# Patient Record
Sex: Female | Born: 1940 | Race: White | Hispanic: No | Marital: Married | Smoking: Never smoker
Health system: Southern US, Community
[De-identification: ages and names within clinical notes are randomized; demographics above are authoritative.]

## PROBLEM LIST (undated history)

## (undated) DIAGNOSIS — I10 Essential (primary) hypertension: Secondary | ICD-10-CM

## (undated) DIAGNOSIS — F419 Anxiety disorder, unspecified: Secondary | ICD-10-CM

## (undated) DIAGNOSIS — F32A Depression, unspecified: Secondary | ICD-10-CM

## (undated) DIAGNOSIS — G4730 Sleep apnea, unspecified: Secondary | ICD-10-CM

## (undated) DIAGNOSIS — T7840XA Allergy, unspecified, initial encounter: Secondary | ICD-10-CM

## (undated) DIAGNOSIS — E079 Disorder of thyroid, unspecified: Secondary | ICD-10-CM

## (undated) DIAGNOSIS — F329 Major depressive disorder, single episode, unspecified: Secondary | ICD-10-CM

## (undated) HISTORY — PX: WRIST FRACTURE SURGERY: SHX121

## (undated) HISTORY — DX: Essential (primary) hypertension: I10

## (undated) HISTORY — DX: Sleep apnea, unspecified: G47.30

## (undated) HISTORY — DX: Major depressive disorder, single episode, unspecified: F32.9

## (undated) HISTORY — PX: FRACTURE SURGERY: SHX138

## (undated) HISTORY — DX: Disorder of thyroid, unspecified: E07.9

## (undated) HISTORY — PX: EYE SURGERY: SHX253

## (undated) HISTORY — DX: Allergy, unspecified, initial encounter: T78.40XA

## (undated) HISTORY — PX: BREAST BIOPSY: SHX20

## (undated) HISTORY — PX: URETHRA SURGERY: SHX824

## (undated) HISTORY — DX: Anxiety disorder, unspecified: F41.9

## (undated) HISTORY — DX: Depression, unspecified: F32.A

---

## 1966-01-21 HISTORY — PX: BREAST SURGERY: SHX581

## 1974-01-21 HISTORY — PX: ABDOMINAL HYSTERECTOMY: SHX81

## 1991-01-22 HISTORY — PX: NECK SURGERY: SHX720

## 2005-01-02 ENCOUNTER — Ambulatory Visit: Payer: Self-pay | Admitting: Family Medicine

## 2005-03-22 ENCOUNTER — Ambulatory Visit: Payer: Self-pay | Admitting: Family Medicine

## 2006-05-22 ENCOUNTER — Ambulatory Visit: Payer: Self-pay | Admitting: Family Medicine

## 2007-11-12 ENCOUNTER — Ambulatory Visit: Payer: Self-pay | Admitting: Family Medicine

## 2008-01-22 DIAGNOSIS — M25561 Pain in right knee: Secondary | ICD-10-CM | POA: Insufficient documentation

## 2008-08-03 ENCOUNTER — Emergency Department: Payer: Self-pay | Admitting: Emergency Medicine

## 2008-08-04 ENCOUNTER — Ambulatory Visit: Payer: Self-pay | Admitting: Unknown Physician Specialty

## 2008-08-08 ENCOUNTER — Emergency Department: Payer: Self-pay | Admitting: Internal Medicine

## 2008-09-02 ENCOUNTER — Encounter: Payer: Self-pay | Admitting: Unknown Physician Specialty

## 2008-09-21 ENCOUNTER — Encounter: Payer: Self-pay | Admitting: Unknown Physician Specialty

## 2008-10-21 ENCOUNTER — Encounter: Payer: Self-pay | Admitting: Unknown Physician Specialty

## 2008-11-08 ENCOUNTER — Ambulatory Visit: Payer: Self-pay | Admitting: Internal Medicine

## 2008-11-21 ENCOUNTER — Encounter: Payer: Self-pay | Admitting: Unknown Physician Specialty

## 2009-03-21 ENCOUNTER — Ambulatory Visit: Payer: Self-pay | Admitting: Internal Medicine

## 2009-04-21 ENCOUNTER — Ambulatory Visit: Payer: Self-pay | Admitting: Internal Medicine

## 2009-06-29 ENCOUNTER — Ambulatory Visit: Payer: Self-pay | Admitting: Internal Medicine

## 2009-07-28 LAB — HM DEXA SCAN

## 2010-02-06 ENCOUNTER — Ambulatory Visit: Payer: Self-pay | Admitting: Otolaryngology

## 2010-06-19 ENCOUNTER — Ambulatory Visit: Payer: Self-pay | Admitting: Internal Medicine

## 2010-10-29 ENCOUNTER — Other Ambulatory Visit: Payer: Self-pay | Admitting: Internal Medicine

## 2010-11-02 ENCOUNTER — Encounter: Payer: Self-pay | Admitting: Internal Medicine

## 2010-11-07 ENCOUNTER — Other Ambulatory Visit: Payer: Self-pay | Admitting: Internal Medicine

## 2010-12-07 ENCOUNTER — Other Ambulatory Visit (INDEPENDENT_AMBULATORY_CARE_PROVIDER_SITE_OTHER): Payer: Medicare Other | Admitting: *Deleted

## 2010-12-07 DIAGNOSIS — Z13220 Encounter for screening for lipoid disorders: Secondary | ICD-10-CM

## 2010-12-07 DIAGNOSIS — Z0000 Encounter for general adult medical examination without abnormal findings: Secondary | ICD-10-CM

## 2010-12-07 DIAGNOSIS — E785 Hyperlipidemia, unspecified: Secondary | ICD-10-CM

## 2010-12-07 LAB — COMPREHENSIVE METABOLIC PANEL
AST: 24 U/L (ref 0–37)
BUN: 21 mg/dL (ref 6–23)
CO2: 28 mEq/L (ref 19–32)
Calcium: 9.1 mg/dL (ref 8.4–10.5)
Chloride: 107 mEq/L (ref 96–112)
Creatinine, Ser: 0.8 mg/dL (ref 0.4–1.2)
GFR: 75.32 mL/min (ref >60.00)
Total Bilirubin: 1.4 mg/dL — ABNORMAL HIGH (ref 0.3–1.2)

## 2010-12-07 LAB — CBC WITH DIFFERENTIAL/PLATELET
Eosinophils Relative: 3.2 % (ref 0.0–5.0)
Lymphocytes Relative: 26.6 % (ref 12.0–46.0)
MCV: 91.3 fl (ref 78.0–100.0)
Monocytes Absolute: 0.7 10*3/uL (ref 0.1–1.0)
Neutrophils Relative %: 60.3 % (ref 43.0–77.0)
Platelets: 133 10*3/uL — ABNORMAL LOW (ref 150.0–400.0)
RBC: 4.62 Mil/uL (ref 3.87–5.11)
WBC: 7.4 10*3/uL (ref 4.5–10.5)

## 2010-12-07 LAB — LIPID PANEL
Cholesterol: 121 mg/dL (ref 0–200)
HDL: 48.4 mg/dL (ref >39.00)
LDL Cholesterol: 59 mg/dL (ref 0–99)
Triglycerides: 66 mg/dL (ref 0.0–149.0)

## 2010-12-12 ENCOUNTER — Ambulatory Visit: Payer: Self-pay | Admitting: Internal Medicine

## 2010-12-14 ENCOUNTER — Encounter: Payer: Self-pay | Admitting: *Deleted

## 2010-12-14 ENCOUNTER — Encounter: Payer: Self-pay | Admitting: Internal Medicine

## 2010-12-18 ENCOUNTER — Ambulatory Visit (INDEPENDENT_AMBULATORY_CARE_PROVIDER_SITE_OTHER): Payer: Medicare Other | Admitting: Internal Medicine

## 2010-12-18 ENCOUNTER — Encounter: Payer: Self-pay | Admitting: Internal Medicine

## 2010-12-18 VITALS — BP 148/80 | HR 63 | Temp 98.5°F | Wt 229.0 lb

## 2010-12-18 DIAGNOSIS — E785 Hyperlipidemia, unspecified: Secondary | ICD-10-CM

## 2010-12-18 DIAGNOSIS — I10 Essential (primary) hypertension: Secondary | ICD-10-CM

## 2010-12-18 DIAGNOSIS — J329 Chronic sinusitis, unspecified: Secondary | ICD-10-CM

## 2010-12-18 DIAGNOSIS — Z0000 Encounter for general adult medical examination without abnormal findings: Secondary | ICD-10-CM

## 2010-12-18 MED ORDER — AMOXICILLIN-POT CLAVULANATE 875-125 MG PO TABS: 1.0000 | ORAL_TABLET | Freq: Two times a day (BID) | ORAL | Status: AC

## 2010-12-18 NOTE — Patient Instructions (Signed)
Labs before next visit

## 2010-12-18 NOTE — Progress Notes (Signed)
Subjective:    Patient ID: Courtney Stone, female    DOB: [birth year], 70 y.o.   MRN: [id number]  HPI 70YO female with HTN presents for follow up.  Reports she has been well. Reports full compliance with BP meds.  Notes recent episode of sinusitis, with sinus pressure, purulent nasal drainage and cough. Denies fever or chills.  Was seen by ENT and treated with clarithromycin with no improvement. Symptoms have now persisted x 1 month.  We discussed findings on recent labs including slightly low plt at 133 and slightly elevated bilirubin as below.  Outpatient Encounter Prescriptions as of 12/18/2010  Medication Sig Dispense Refill  . B Complex-C-Folic Acid (MULTIVITAMIN, STRESS FORMULA) tablet Take 1 tablet by mouth daily.        . Calcium Carbonate-Vitamin D (CALCIUM 600+D) 600-400 MG-UNIT per tablet Take 1 tablet by mouth 2 (two) times daily.        . fluocinonide cream (LIDEX) 0.05 % Apply 1 application topically 2 (two) times daily as needed.        Marland Kitchen FLUoxetine (PROZAC) 20 MG capsule take 1 capsule by mouth once daily  600 capsule  0  . NASONEX 50 MCG/ACT nasal spray instill 2 sprays into each nostril once daily  17 g  3  . triamterene-hydrochlorothiazide (MAXZIDE-25) 37.5-25 MG per tablet take 1 tablet by mouth once daily  30 tablet  3  . vitamin C (ASCORBIC ACID) 500 MG tablet Take 500 mg by mouth daily.          Review of Systems  Constitutional: Negative for fever, chills, appetite change, fatigue and unexpected weight change.  HENT: Positive for congestion, rhinorrhea, postnasal drip and sinus pressure. Negative for ear pain, sore throat, trouble swallowing, neck pain and voice change.   Eyes: Negative for visual disturbance.  Respiratory: Positive for cough. Negative for shortness of breath and wheezing.   Cardiovascular: Negative for chest pain, palpitations and leg swelling.  Gastrointestinal: Negative for nausea, abdominal pain, diarrhea, constipation and abdominal distention.    Genitourinary: Negative for dysuria and flank pain.  Musculoskeletal: Negative for myalgias, arthralgias and gait problem.  Skin: Negative for color change and rash.  Neurological: Positive for headaches. Negative for dizziness.  Hematological: Negative for adenopathy. Does not bruise/bleed easily.  Psychiatric/Behavioral: Negative for sleep disturbance and dysphoric mood. The patient is not nervous/anxious.    BP 148/80  Pulse 63  Temp(Src) 98.5 F (36.9 C) (Oral)  Wt 229 lb (103.874 kg)  SpO2 97%     Objective:   Physical Exam  Constitutional: She is oriented to person, place, and time. She appears well-developed and well-nourished. No distress.  HENT:  Head: Normocephalic and atraumatic.  Right Ear: External ear normal. A middle ear effusion is present.  Left Ear: External ear normal. A middle ear effusion is present.  Nose: Mucosal edema present.  Mouth/Throat: Oropharynx is clear and moist. No oropharyngeal exudate.  Eyes: Conjunctivae are normal. Pupils are equal, round, and reactive to light. Right eye exhibits no discharge. Left eye exhibits no discharge. No scleral icterus.  Neck: Normal range of motion. Neck supple. No tracheal deviation present. No thyromegaly present.  Cardiovascular: Normal rate, regular rhythm, normal heart sounds and intact distal pulses.  Exam reveals no gallop and no friction rub.   No murmur heard. Pulmonary/Chest: Effort normal and breath sounds normal. No respiratory distress. She has no wheezes. She has no rales. She exhibits no tenderness.  Musculoskeletal: Normal range of motion. She exhibits no edema and  no tenderness.  Lymphadenopathy:    She has no cervical adenopathy.  Neurological: She is alert and oriented to person, place, and time. No cranial nerve deficit. She exhibits normal muscle tone. Coordination normal.  Skin: Skin is warm and dry. No rash noted. She is not diaphoretic. No erythema. No pallor.  Psychiatric: She has a normal  mood and affect. Her behavior is normal. Judgment and thought content normal.          Assessment & Plan:  1. Sinusitis - Recurrent. No improvement with clarithromycin. Will try course of Augmentin. She will continue nasonex. If persistent, she will followup with ENT.  2. Hypertension - BP slightly elevated today, but generally well controlled on Triamterene-HCTZ.  Renal function was normal 11/2010.  Follow up in 6 months.  3. Thrombocytopenia - Note plt 133 on last check. No bleeding or bruising.  Pt prefers to wait until labs 05/2011 to recheck. Will call sooner if any easy bleeding or bruising.  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 Will repeat with labs in 6 months.  5. Health maintenance - Flu shot today. Pt prefers mammogram every other year, so next in 05/2011.

## 2010-12-27 ENCOUNTER — Encounter: Payer: Self-pay | Admitting: Internal Medicine

## 2011-04-22 ENCOUNTER — Ambulatory Visit (INDEPENDENT_AMBULATORY_CARE_PROVIDER_SITE_OTHER): Payer: Medicare Other | Admitting: Internal Medicine

## 2011-04-22 ENCOUNTER — Encounter: Payer: Self-pay | Admitting: Internal Medicine

## 2011-04-22 VITALS — BP 128/70 | HR 75 | Temp 98.3°F | Ht 67.0 in | Wt 235.0 lb

## 2011-04-22 DIAGNOSIS — J0110 Acute frontal sinusitis, unspecified: Secondary | ICD-10-CM | POA: Insufficient documentation

## 2011-04-22 MED ORDER — HYDROCOD POLST-CHLORPHEN POLST 10-8 MG/5ML PO LQCR: 5.0000 mL | Freq: Every evening | ORAL | Status: DC | PRN

## 2011-04-22 MED ORDER — AMOXICILLIN-POT CLAVULANATE 875-125 MG PO TABS: 1.0000 | ORAL_TABLET | Freq: Two times a day (BID) | ORAL | Status: AC

## 2011-04-22 NOTE — Assessment & Plan Note (Signed)
Symptoms and exam c/w frontal sinusitis. Will treat with augmentin x 10 days. Will use Tussionex for cough. Pt will call if symptoms not improving over next 48hr.

## 2011-04-22 NOTE — Progress Notes (Signed)
Subjective:    Patient ID: Courtney Stone, female    DOB: [birth year], 71 y.o.   MRN: [id number]  HPI 70YO female presents for acute visit c/o 4 week h/o nasal congestion, non-productive cough.  Symptoms were initially associated with subjective fever, chills, malaise, which have now resolved.  Pt now having frontal headache pain, bilateral ear pain.  Cough persistent. No dyspnea, chest pain.  Taking Tussionex for cough with some improvement.  Outpatient Encounter Prescriptions as of 04/22/2011  Medication Sig Dispense Refill  . B Complex-C-Folic Acid (MULTIVITAMIN, STRESS FORMULA) tablet Take 1 tablet by mouth daily.        . Calcium Carbonate-Vitamin D (CALCIUM 600+D) 600-400 MG-UNIT per tablet Take 1 tablet by mouth 2 (two) times daily.        Marland Kitchen FLUoxetine (PROZAC) 20 MG capsule take 1 capsule by mouth once daily  600 capsule  0  . NASONEX 50 MCG/ACT nasal spray instill 2 sprays into each nostril once daily  17 g  3  . triamterene-hydrochlorothiazide (MAXZIDE-25) 37.5-25 MG per tablet take 1 tablet by mouth once daily  30 tablet  3  . vitamin C (ASCORBIC ACID) 500 MG tablet Take 500 mg by mouth daily.        Marland Kitchen DISCONTD: fluocinonide cream (LIDEX) 0.05 % Apply 1 application topically 2 (two) times daily as needed.        Marland Kitchen amoxicillin-clavulanate (AUGMENTIN) 875-125 MG per tablet Take 1 tablet by mouth 2 (two) times daily.  20 tablet  0  . chlorpheniramine-HYDROcodone (TUSSIONEX PENNKINETIC ER) 10-8 MG/5ML LQCR Take 5 mLs by mouth at bedtime as needed.  140 mL  0    Review of Systems  Constitutional: Positive for fever, chills and fatigue. Negative for unexpected weight change.  HENT: Positive for ear pain, congestion and sinus pressure. Negative for hearing loss, nosebleeds, sore throat, facial swelling, rhinorrhea, sneezing, mouth sores, trouble swallowing, neck pain, neck stiffness, voice change, postnasal drip, tinnitus and ear discharge.   Eyes: Negative for pain, discharge, redness and  visual disturbance.  Respiratory: Positive for cough. Negative for chest tightness, shortness of breath, wheezing and stridor.   Cardiovascular: Negative for chest pain, palpitations and leg swelling.  Musculoskeletal: Negative for myalgias and arthralgias.  Skin: Negative for color change and rash.  Neurological: Negative for dizziness, weakness, light-headedness and headaches.  Hematological: Negative for adenopathy.   BP 128/70  Pulse 75  Temp(Src) 98.3 F (36.8 C) (Oral)  Ht 5\' 7"  (1.702 m)  Wt 235 lb (106.595 kg)  BMI 36.81 kg/m2  SpO2 97%     Objective:   Physical Exam  Constitutional: She is oriented to person, place, and time. She appears well-developed and well-nourished. No distress.  HENT:  Head: Normocephalic and atraumatic.  Right Ear: External ear normal. Tympanic membrane is bulging. Tympanic membrane is not erythematous. A middle ear effusion is present.  Left Ear: External ear normal. Tympanic membrane is bulging. Tympanic membrane is not erythematous. A middle ear effusion is present.  Nose: Mucosal edema present. Right sinus exhibits frontal sinus tenderness.  Mouth/Throat: Oropharynx is clear and moist. No oropharyngeal exudate.  Eyes: Conjunctivae are normal. Pupils are equal, round, and reactive to light. Right eye exhibits no discharge. Left eye exhibits no discharge. No scleral icterus.  Neck: Normal range of motion. Neck supple. No tracheal deviation present. No thyromegaly present.  Cardiovascular: Normal rate, regular rhythm, normal heart sounds and intact distal pulses.  Exam reveals no gallop and no friction rub.  No murmur heard. Pulmonary/Chest: Effort normal. No accessory muscle usage. Not tachypneic. No respiratory distress. She has no decreased breath sounds. She has no wheezes. She has rhonchi in the left lower field. She has no rales. She exhibits no tenderness.  Musculoskeletal: Normal range of motion. She exhibits no edema and no tenderness.    Lymphadenopathy:    She has no cervical adenopathy.  Neurological: She is alert and oriented to person, place, and time. No cranial nerve deficit. She exhibits normal muscle tone. Coordination normal.  Skin: Skin is warm and dry. No rash noted. She is not diaphoretic. No erythema. No pallor.  Psychiatric: She has a normal mood and affect. Her behavior is normal. Judgment and thought content normal.          Assessment & Plan:

## 2011-06-18 ENCOUNTER — Ambulatory Visit: Payer: Medicare Other | Admitting: Internal Medicine

## 2011-06-21 ENCOUNTER — Telehealth: Payer: Self-pay | Admitting: Internal Medicine

## 2011-06-21 ENCOUNTER — Ambulatory Visit (INDEPENDENT_AMBULATORY_CARE_PROVIDER_SITE_OTHER): Payer: Medicare Other | Admitting: Internal Medicine

## 2011-06-21 ENCOUNTER — Encounter: Payer: Self-pay | Admitting: Internal Medicine

## 2011-06-21 VITALS — BP 150/88 | HR 63 | Temp 98.1°F | Resp 16 | Ht 66.5 in | Wt 228.0 lb

## 2011-06-21 DIAGNOSIS — E669 Obesity, unspecified: Secondary | ICD-10-CM

## 2011-06-21 DIAGNOSIS — I10 Essential (primary) hypertension: Secondary | ICD-10-CM

## 2011-06-21 DIAGNOSIS — E039 Hypothyroidism, unspecified: Secondary | ICD-10-CM

## 2011-06-21 DIAGNOSIS — E119 Type 2 diabetes mellitus without complications: Secondary | ICD-10-CM | POA: Insufficient documentation

## 2011-06-21 DIAGNOSIS — Z1239 Encounter for other screening for malignant neoplasm of breast: Secondary | ICD-10-CM

## 2011-06-21 DIAGNOSIS — Z0000 Encounter for general adult medical examination without abnormal findings: Secondary | ICD-10-CM

## 2011-06-21 DIAGNOSIS — D649 Anemia, unspecified: Secondary | ICD-10-CM

## 2011-06-21 LAB — CBC WITH DIFFERENTIAL/PLATELET
Basophils Relative: 0.5 % (ref 0.0–3.0)
Eosinophils Relative: 1.5 % (ref 0.0–5.0)
Lymphocytes Relative: 31.3 % (ref 12.0–46.0)
MCV: 90.5 fl (ref 78.0–100.0)
Monocytes Relative: 10 % (ref 3.0–12.0)
Neutrophils Relative %: 56.7 % (ref 43.0–77.0)
Platelets: 131 10*3/uL — ABNORMAL LOW (ref 150.0–400.0)
RBC: 4.73 Mil/uL (ref 3.87–5.11)
WBC: 5.9 10*3/uL (ref 4.5–10.5)

## 2011-06-21 LAB — COMPREHENSIVE METABOLIC PANEL
ALT: 21 U/L (ref 0–35)
BUN: 14 mg/dL (ref 6–23)
CO2: 27 mEq/L (ref 19–32)
Creatinine, Ser: 0.8 mg/dL (ref 0.4–1.2)
GFR: 76.31 mL/min (ref >60.00)
Total Bilirubin: 1.3 mg/dL — ABNORMAL HIGH (ref 0.3–1.2)

## 2011-06-21 LAB — LIPID PANEL
HDL: 42.4 mg/dL (ref >39.00)
LDL Cholesterol: 48 mg/dL (ref 0–99)
Total CHOL/HDL Ratio: 3
Triglycerides: 87 mg/dL (ref 0.0–149.0)

## 2011-06-21 LAB — HEMOGLOBIN A1C: Hgb A1c MFr Bld: 6.8 % — ABNORMAL HIGH (ref 4.6–6.5)

## 2011-06-21 LAB — TSH: TSH: 0.07 u[IU]/mL — ABNORMAL LOW (ref 0.35–5.50)

## 2011-06-21 MED ORDER — TRIAMTERENE-HCTZ 37.5-25 MG PO TABS: 1.0000 | ORAL_TABLET | Freq: Every day | ORAL | Status: DC

## 2011-06-21 NOTE — Progress Notes (Signed)
Subjective:    Patient ID: Courtney Stone, female    DOB: 1940-12-22, 71 y.o.   MRN: [id number]  HPI The patient is here for annual Medicare wellness examination and management of other chronic and acute problems.   The risk factors are reflected in the social history.  The roster of all physicians providing medical care to patient - is listed in the Snapshot section of the chart.  Activities of daily living:  The patient is 100% independent in all ADLs: dressing, toileting, feeding as well as independent mobility  Home safety : The patient has smoke detectors in the home. They wear seatbelts.  There are no firearms at home. There is no violence in the home.   There is no risks for hepatitis, STDs or HIV. There is no history of blood transfusion. They have no travel history to infectious disease endemic areas of the world.  The patient has seen their dentist in the last six month [facility]). They have seen their eye doctor in the last year (Dr. Dorcas Mcmurray). Hearing testing in distant past, occasional trouble hearing whispered voice, followed by ENT.   They do not  have excessive sun exposure. Discussed the need for sun protection: hats, long sleeves and use of sunscreen if there is significant sun exposure.   Diet: the importance of a healthy diet is discussed. They do have a healthy diet.  The benefits of regular aerobic exercise were discussed. No regular exercise. Walking occasionally.  Depression screen: there are no signs or vegative symptoms of depression- irritability, change in appetite, anhedonia, sadness/tearfullness.  Cognitive assessment: the patient manages all their financial and personal affairs and is actively engaged. They could relate day,date,year and events; performed clock-face test normally. Volunteers at school, teaching math.  The following portions of the patient's history were reviewed and updated as appropriate: allergies, current medications, past family  history, past medical history,  past surgical history, past social history  and problem list.  Visual acuity was not assessed per patient preference since she has regular follow up with her ophthalmologist. Hearing and body mass index were assessed and reviewed.   During the course of the visit the patient was educated and counseled about appropriate screening and preventive services including : fall prevention , diabetes screening, nutrition counseling, colorectal cancer screening, and recommended immunizations.    Patient has a history of hypertension and diet-controlled prediabetes. She reports that over the last month or so her sugars have been more elevated, typically near 120. She reports increased stress at work, he notes some dietary indiscretion. She is not currently taking medication for diabetes. In regards to her blood pressure, she reports full compliance with her medication. She denies any chest pain, palpitations, headache.  Outpatient Encounter Prescriptions as of 06/21/2011  Medication Sig Dispense Refill  . B Complex-C-Folic Acid (MULTIVITAMIN, STRESS FORMULA) tablet Take 1 tablet by mouth daily.        . Calcium Carbonate-Vitamin D (CALCIUM 600+D) 600-400 MG-UNIT per tablet Take 1 tablet by mouth 2 (two) times daily.        Marland Kitchen triamterene-hydrochlorothiazide (MAXZIDE-25) 37.5-25 MG per tablet Take 1 each (1 tablet total) by mouth daily.  90 tablet  3  . vitamin C (ASCORBIC ACID) 500 MG tablet Take 500 mg by mouth daily.          Review of Systems  Constitutional: Negative for fever, chills, appetite change, fatigue and unexpected weight change.  HENT: Negative for ear pain, congestion, sore throat, trouble swallowing, neck  pain, voice change and sinus pressure.   Eyes: Negative for visual disturbance.  Respiratory: Negative for cough, shortness of breath, wheezing and stridor.   Cardiovascular: Negative for chest pain, palpitations and leg swelling.  Gastrointestinal: Negative  for nausea, vomiting, abdominal pain, diarrhea, constipation, blood in stool, abdominal distention and anal bleeding.  Genitourinary: Negative for dysuria and flank pain.  Musculoskeletal: Negative for myalgias, arthralgias and gait problem.  Skin: Negative for color change and rash.  Neurological: Negative for dizziness and headaches.  Hematological: Negative for adenopathy. Does not bruise/bleed easily.  Psychiatric/Behavioral: Negative for suicidal ideas, sleep disturbance and dysphoric mood. The patient is not nervous/anxious.    BP 150/88  Pulse 63  Temp(Src) 98.1 F (36.7 C) (Oral)  Resp 16  Ht 5' 6.5" (1.689 m)  Wt 228 lb (103.42 kg)  BMI 36.25 kg/m2  SpO2 94%     Objective:   Physical Exam  Constitutional: She is oriented to person, place, and time. She appears well-developed and well-nourished. No distress.  HENT:  Head: Normocephalic and atraumatic.  Right Ear: External ear normal.  Left Ear: External ear normal.  Nose: Nose normal.  Mouth/Throat: Oropharynx is clear and moist. No oropharyngeal exudate.  Eyes: Conjunctivae are normal. Pupils are equal, round, and reactive to light. Right eye exhibits no discharge. Left eye exhibits no discharge. No scleral icterus.  Neck: Normal range of motion. Neck supple. No tracheal deviation present. No thyromegaly present.  Cardiovascular: Normal rate, regular rhythm, normal heart sounds and intact distal pulses.  Exam reveals no gallop and no friction rub.   No murmur heard. Pulmonary/Chest: Effort normal and breath sounds normal. No respiratory distress. She has no wheezes. She has no rales. She exhibits no tenderness. Right breast exhibits no inverted nipple, no mass, no nipple discharge, no skin change and no tenderness. Left breast exhibits no inverted nipple, no mass, no nipple discharge, no skin change and no tenderness. Breasts are symmetrical.  Abdominal: Soft. Bowel sounds are normal. She exhibits no distension and no mass.  There is no tenderness. There is no rebound and no guarding.  Musculoskeletal: Normal range of motion. She exhibits no edema and no tenderness.  Lymphadenopathy:    She has no cervical adenopathy.  Neurological: She is alert and oriented to person, place, and time. No cranial nerve deficit. She exhibits normal muscle tone. Coordination normal.  Skin: Skin is warm and dry. No rash noted. She is not diaphoretic. No erythema. No pallor.  Psychiatric: She has a normal mood and affect. Her behavior is normal. Judgment and thought content normal.          Assessment & Plan:

## 2011-06-21 NOTE — Assessment & Plan Note (Signed)
Blood pressure slightly elevated today. However, patient reports better controlled at home. We'll continue triamterene hydrochlorothiazide. We'll check renal function with labs today. Follow up 6 months.

## 2011-06-21 NOTE — Assessment & Plan Note (Signed)
General medical exam including breast exam is normal today. Patient is up-to-date on health maintenance. Appropriate screening performed and referrals placed. Patient will followup in 6 months.

## 2011-06-21 NOTE — Assessment & Plan Note (Signed)
BMI 36. Encouraged better compliance with healthy diet and increased physical activity. Recommended walking at least 30 minutes most days of the week.

## 2011-06-21 NOTE — Telephone Encounter (Signed)
Last mammogram reviewed from June 9 , 2011. Pt will need to have follow up mammogram. Order is placed

## 2011-06-21 NOTE — Assessment & Plan Note (Signed)
Historically, blood sugars have been controlled with diet, however patient reports recent increase in blood sugars. Will check hemoglobin A1c with labs today.

## 2011-06-27 ENCOUNTER — Telehealth: Payer: Self-pay | Admitting: *Deleted

## 2011-06-27 NOTE — Telephone Encounter (Signed)
Patient was advised of her lab results.  She stated that she would rather see Dr. Dan Humphreys again first before starting any medication.  Follow up appt scheduled for 07/29/2011.

## 2011-07-29 ENCOUNTER — Telehealth: Payer: Self-pay | Admitting: Internal Medicine

## 2011-07-29 ENCOUNTER — Encounter: Payer: Self-pay | Admitting: Internal Medicine

## 2011-07-29 ENCOUNTER — Ambulatory Visit (INDEPENDENT_AMBULATORY_CARE_PROVIDER_SITE_OTHER): Payer: Medicare Other | Admitting: Internal Medicine

## 2011-07-29 VITALS — BP 120/80 | HR 61 | Temp 98.6°F | Ht 66.5 in | Wt 227.0 lb

## 2011-07-29 DIAGNOSIS — E0590 Thyrotoxicosis, unspecified without thyrotoxic crisis or storm: Secondary | ICD-10-CM | POA: Insufficient documentation

## 2011-07-29 DIAGNOSIS — E039 Hypothyroidism, unspecified: Secondary | ICD-10-CM

## 2011-07-29 DIAGNOSIS — E119 Type 2 diabetes mellitus without complications: Secondary | ICD-10-CM

## 2011-07-29 DIAGNOSIS — E1165 Type 2 diabetes mellitus with hyperglycemia: Secondary | ICD-10-CM | POA: Insufficient documentation

## 2011-07-29 MED ORDER — GLUCOSE BLOOD VI STRP: ORAL_STRIP | Status: AC

## 2011-07-29 NOTE — Progress Notes (Signed)
Subjective:    Patient ID: Courtney Stone, female    DOB: 03/26/1940, 71 y.o.   MRN: [id number]  HPI 71 year old female with history of diabetes presents for followup. Historically, her diabetes has been diet controlled. At her last visit, her A1c was noted to have increased to 6.8%. Recommended that she start on metformin. However, she would prefer to control blood sugars with diet. She reports significant improvement in dietary intake and physical activity. She brings record of her fasting blood sugars which are typically between 100-130. She denies any sugars greater than 200. She otherwise reports that she has been feeling well.  Outpatient Encounter Prescriptions as of 07/29/2011  Medication Sig Dispense Refill  . Calcium Carbonate-Vitamin D (CALCIUM 600+D) 600-400 MG-UNIT per tablet Take 1 tablet by mouth 2 (two) times daily.        . Multiple Vitamins-Minerals (CENTRUM SILVER PO) Take 1 tablet by mouth daily.      Marland Kitchen triamterene-hydrochlorothiazide (MAXZIDE-25) 37.5-25 MG per tablet Take 1 each (1 tablet total) by mouth daily.  90 tablet  3  . vitamin C (ASCORBIC ACID) 500 MG tablet Take 500 mg by mouth daily.        Marland Kitchen glucose blood test strip Use as instructed  100 each  12  . DISCONTD: B Complex-C-Folic Acid (MULTIVITAMIN, STRESS FORMULA) tablet Take 1 tablet by mouth daily.          Review of Systems  Constitutional: Negative for fever, chills, appetite change, fatigue and unexpected weight change.  HENT: Negative for ear pain, congestion, sore throat, trouble swallowing, neck pain, voice change and sinus pressure.   Eyes: Negative for visual disturbance.  Respiratory: Negative for cough, shortness of breath, wheezing and stridor.   Cardiovascular: Negative for chest pain, palpitations and leg swelling.  Gastrointestinal: Negative for nausea, vomiting, abdominal pain, diarrhea, constipation, blood in stool, abdominal distention and anal bleeding.  Genitourinary: Negative for dysuria and  flank pain.  Musculoskeletal: Negative for myalgias, arthralgias and gait problem.  Skin: Negative for color change and rash.  Neurological: Negative for dizziness and headaches.  Hematological: Negative for adenopathy. Does not bruise/bleed easily.  Psychiatric/Behavioral: Negative for suicidal ideas, disturbed wake/sleep cycle and dysphoric mood. The patient is not nervous/anxious.    BP 120/80  Pulse 61  Temp 98.6 F (37 C) (Oral)  Ht 5' 6.5" (1.689 m)  Wt 227 lb (102.967 kg)  BMI 36.09 kg/m2  SpO2 97%     Objective:   Physical Exam  Constitutional: She is oriented to person, place, and time. She appears well-developed and well-nourished. No distress.  HENT:  Head: Normocephalic and atraumatic.  Right Ear: External ear normal.  Left Ear: External ear normal.  Nose: Nose normal.  Mouth/Throat: Oropharynx is clear and moist. No oropharyngeal exudate.  Eyes: Conjunctivae are normal. Pupils are equal, round, and reactive to light. Right eye exhibits no discharge. Left eye exhibits no discharge. No scleral icterus.  Neck: Normal range of motion. Neck supple. No tracheal deviation present. No thyromegaly present.  Cardiovascular: Normal rate, regular rhythm, normal heart sounds and intact distal pulses.  Exam reveals no gallop and no friction rub.   No murmur heard. Pulmonary/Chest: Effort normal and breath sounds normal. No respiratory distress. She has no wheezes. She has no rales. She exhibits no tenderness.  Musculoskeletal: Normal range of motion. She exhibits no edema and no tenderness.  Lymphadenopathy:    She has no cervical adenopathy.  Neurological: She is alert and oriented to person, place, and  time. No cranial nerve deficit. She exhibits normal muscle tone. Coordination normal.  Skin: Skin is warm and dry. No rash noted. She is not diaphoretic. No erythema. No pallor.  Psychiatric: She has a normal mood and affect. Her behavior is normal. Judgment and thought content  normal.          Assessment & Plan:

## 2011-07-29 NOTE — Assessment & Plan Note (Signed)
Patient noted to have low TSH on recent labs. Asymptomatic. Will repeat a TSH and free T4 with labs at next visit in August 2013.

## 2011-07-29 NOTE — Telephone Encounter (Signed)
Received reports [facility]> Pt is due for both mammogram and bone density testing. We can set this up for her if she would like.

## 2011-07-29 NOTE — Assessment & Plan Note (Signed)
Recent hemoglobin A1c elevated at 6.8%. Recommended that patient start metformin, however patient would prefer to control blood sugars with diet. 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 Encouraged her to continue with this. Will plan to recheck A1c in August 2013.

## 2011-08-23 ENCOUNTER — Ambulatory Visit: Payer: Self-pay | Admitting: Internal Medicine

## 2011-08-23 LAB — HM MAMMOGRAPHY

## 2011-08-29 ENCOUNTER — Ambulatory Visit: Payer: Self-pay | Admitting: Internal Medicine

## 2011-08-29 ENCOUNTER — Other Ambulatory Visit: Payer: Self-pay | Admitting: Internal Medicine

## 2011-08-29 DIAGNOSIS — M810 Age-related osteoporosis without current pathological fracture: Secondary | ICD-10-CM | POA: Insufficient documentation

## 2011-09-02 ENCOUNTER — Encounter: Payer: Self-pay | Admitting: Internal Medicine

## 2011-09-02 ENCOUNTER — Telehealth: Payer: Self-pay | Admitting: Internal Medicine

## 2011-09-02 NOTE — Telephone Encounter (Signed)
Bone density test showed osteopenia, early weakening of the bones with T-score -1.5.  We should discuss treatment at visit.

## 2011-09-03 NOTE — Telephone Encounter (Signed)
Patient advised as instructed via telephone. 

## 2011-09-10 ENCOUNTER — Encounter: Payer: Self-pay | Admitting: Internal Medicine

## 2011-09-26 ENCOUNTER — Other Ambulatory Visit (INDEPENDENT_AMBULATORY_CARE_PROVIDER_SITE_OTHER): Payer: Medicare Other | Admitting: *Deleted

## 2011-09-26 DIAGNOSIS — J329 Chronic sinusitis, unspecified: Secondary | ICD-10-CM

## 2011-09-26 DIAGNOSIS — E039 Hypothyroidism, unspecified: Secondary | ICD-10-CM

## 2011-09-26 DIAGNOSIS — E119 Type 2 diabetes mellitus without complications: Secondary | ICD-10-CM

## 2011-09-26 LAB — CBC WITH DIFFERENTIAL/PLATELET
Basophils Absolute: 0 10*3/uL (ref 0.0–0.1)
Eosinophils Relative: 2.2 % (ref 0.0–5.0)
HCT: 43.4 % (ref 36.0–46.0)
Hemoglobin: 14.4 g/dL (ref 12.0–15.0)
Lymphocytes Relative: 29.1 % (ref 12.0–46.0)
Lymphs Abs: 2 10*3/uL (ref 0.7–4.0)
Monocytes Relative: 10.4 % (ref 3.0–12.0)
Platelets: 133 10*3/uL — ABNORMAL LOW (ref 150.0–400.0)
WBC: 6.8 10*3/uL (ref 4.5–10.5)

## 2011-09-26 LAB — COMPREHENSIVE METABOLIC PANEL
ALT: 33 U/L (ref 0–35)
Albumin: 3.9 g/dL (ref 3.5–5.2)
CO2: 24 mEq/L (ref 19–32)
Calcium: 9.1 mg/dL (ref 8.4–10.5)
Chloride: 109 mEq/L (ref 96–112)
GFR: 64.77 mL/min (ref >60.00)
Glucose, Bld: 123 mg/dL — ABNORMAL HIGH (ref 70–99)
Sodium: 140 mEq/L (ref 135–145)
Total Bilirubin: 1 mg/dL (ref 0.3–1.2)
Total Protein: 6.4 g/dL (ref 6.0–8.3)

## 2011-09-26 LAB — HEMOGLOBIN A1C: Hgb A1c MFr Bld: 6.7 % — ABNORMAL HIGH (ref 4.6–6.5)

## 2011-09-30 ENCOUNTER — Encounter: Payer: Self-pay | Admitting: Internal Medicine

## 2011-09-30 ENCOUNTER — Ambulatory Visit (INDEPENDENT_AMBULATORY_CARE_PROVIDER_SITE_OTHER): Payer: Medicare Other | Admitting: Internal Medicine

## 2011-09-30 VITALS — BP 110/80 | HR 69 | Temp 98.6°F | Ht 66.5 in | Wt 227.5 lb

## 2011-09-30 DIAGNOSIS — Z23 Encounter for immunization: Secondary | ICD-10-CM

## 2011-09-30 DIAGNOSIS — D696 Thrombocytopenia, unspecified: Secondary | ICD-10-CM | POA: Insufficient documentation

## 2011-09-30 DIAGNOSIS — E119 Type 2 diabetes mellitus without complications: Secondary | ICD-10-CM

## 2011-09-30 DIAGNOSIS — E079 Disorder of thyroid, unspecified: Secondary | ICD-10-CM

## 2011-09-30 DIAGNOSIS — E039 Hypothyroidism, unspecified: Secondary | ICD-10-CM

## 2011-09-30 NOTE — Addendum Note (Signed)
Addended by: Gilmer Mor on: 09/30/2011 09:39 AM   Modules accepted: Orders

## 2011-09-30 NOTE — Progress Notes (Signed)
Subjective:    Patient ID: Courtney Stone, female    DOB: [birth year], 71 y.o.   MRN: [id number]  HPI 71YO female with h/o DM, hypertension presents for follow up. Has been working with dietician to lower BG.  Recent A1c decreased from 6.8% to 6.7%.  Pt denies elevated BG greater than 250.  Has also been working on increasing physical activity. Does not wish to start medication for DM at this time, would prefer to continue to work on diet and exercise. Otherwise doing well. No complaints today.  Outpatient Encounter Prescriptions as of 09/30/2011  Medication Sig Dispense Refill  . Calcium Carbonate-Vitamin D (CALCIUM 600+D) 600-400 MG-UNIT per tablet Take 1 tablet by mouth 2 (two) times daily.        Marland Kitchen glucose blood test strip Use as instructed  100 each  12  . Multiple Vitamins-Minerals (CENTRUM SILVER PO) Take 1 tablet by mouth daily.      Marland Kitchen triamterene-hydrochlorothiazide (MAXZIDE-25) 37.5-25 MG per tablet Take 1 each (1 tablet total) by mouth daily.  90 tablet  3  . vitamin C (ASCORBIC ACID) 500 MG tablet Take 500 mg by mouth daily.         BP 110/80  Pulse 69  Temp 98.6 F (37 C) (Oral)  Ht 5' 6.5" (1.689 m)  Wt 227 lb 8 oz (103.193 kg)  BMI 36.17 kg/m2  SpO2 97%  Review of Systems  Constitutional: Negative for fever, chills, appetite change, fatigue and unexpected weight change.  HENT: Negative for ear pain, congestion, sore throat, trouble swallowing, neck pain, voice change and sinus pressure.   Eyes: Negative for visual disturbance.  Respiratory: Negative for cough, shortness of breath, wheezing and stridor.   Cardiovascular: Negative for chest pain, palpitations and leg swelling.  Gastrointestinal: Negative for nausea, vomiting, abdominal pain, diarrhea, constipation, blood in stool, abdominal distention and anal bleeding.  Genitourinary: Negative for dysuria and flank pain.  Musculoskeletal: Negative for myalgias, arthralgias and gait problem.  Skin: Negative for color change  and rash.  Neurological: Negative for dizziness and headaches.  Hematological: Negative for adenopathy. Does not bruise/bleed easily.  Psychiatric/Behavioral: Negative for suicidal ideas, disturbed wake/sleep cycle and dysphoric mood. The patient is not nervous/anxious.        Objective:   Physical Exam  Constitutional: She is oriented to person, place, and time. She appears well-developed and well-nourished. No distress.  HENT:  Head: Normocephalic and atraumatic.  Right Ear: External ear normal.  Left Ear: External ear normal.  Nose: Nose normal.  Mouth/Throat: Oropharynx is clear and moist. No oropharyngeal exudate.  Eyes: Conjunctivae are normal. Pupils are equal, round, and reactive to light. Right eye exhibits no discharge. Left eye exhibits no discharge. No scleral icterus.  Neck: Normal range of motion. Neck supple. No tracheal deviation present. No thyromegaly present.  Cardiovascular: Normal rate, regular rhythm, normal heart sounds and intact distal pulses.  Exam reveals no gallop and no friction rub.   No murmur heard. Pulmonary/Chest: Effort normal and breath sounds normal. No respiratory distress. She has no wheezes. She has no rales. She exhibits no tenderness.  Musculoskeletal: Normal range of motion. She exhibits no edema and no tenderness.  Lymphadenopathy:    She has no cervical adenopathy.  Neurological: She is alert and oriented to person, place, and time. No cranial nerve deficit. She exhibits normal muscle tone. Coordination normal.  Skin: Skin is warm and dry. No rash noted. She is not diaphoretic. No erythema. No pallor.  Psychiatric: She has  a normal mood and affect. Her behavior is normal. Judgment and thought content normal.          Assessment & Plan:

## 2011-09-30 NOTE — Assessment & Plan Note (Signed)
Recent thyroid numbers are normal. Will repeat TSH and free T4 in 3 months.

## 2011-09-30 NOTE — Assessment & Plan Note (Signed)
A1c unchanged over last 3 months at 6.7%.  Pt would prefer to continue to work with dietician and repeat A1c in 3 months to see if any improvement prior to starting Metformin. Follow up 3 months.

## 2011-09-30 NOTE — Assessment & Plan Note (Signed)
Historically platelets on low end of normal, near 130. Stable on recent labs. Will repeat with labs in 3 months.

## 2011-11-08 ENCOUNTER — Encounter: Payer: Self-pay | Admitting: Internal Medicine

## 2011-11-08 ENCOUNTER — Ambulatory Visit (INDEPENDENT_AMBULATORY_CARE_PROVIDER_SITE_OTHER): Payer: Medicare Other | Admitting: Internal Medicine

## 2011-11-08 VITALS — BP 136/84 | HR 80 | Temp 98.1°F | Ht 67.0 in | Wt 226.0 lb

## 2011-11-08 DIAGNOSIS — J069 Acute upper respiratory infection, unspecified: Secondary | ICD-10-CM

## 2011-11-08 DIAGNOSIS — J019 Acute sinusitis, unspecified: Secondary | ICD-10-CM

## 2011-11-08 MED ORDER — MOMETASONE FUROATE 50 MCG/ACT NA SUSP: 2.0000 | Freq: Every day | NASAL | Status: DC

## 2011-11-08 MED ORDER — CEFDINIR 300 MG PO CAPS: 300.0000 mg | ORAL_CAPSULE | Freq: Two times a day (BID) | ORAL | Status: DC

## 2011-11-08 NOTE — Patient Instructions (Addendum)
It was nice meeting you today.  I am sorry you have not been feeling well.  I am going to prescribe an antibiotic (Omnicef) to take 2x/day.  Continue the Nasonex nasal spray as you have been doing.  Use saline nasal spray and flush nose at least 2-3x/day.   Mucinex DM in the am and Robitussin DM in the evening.  Rest.  Fluids.  If symptoms do not resolve or if you have any problems - let us know.

## 2011-11-08 NOTE — Progress Notes (Signed)
  Subjective:    Patient ID: Courtney Stone, female    DOB: 03-Mar-1940, 71 y.o.   MRN: [id number]  HPI 71 year old female with past history of hypertension and diabetes who presents with concerns regarding persistent sinus congestion, sinus pressure and cough.  States symptoms started 10/04/11.  Was evaluated by Dr Andee Poles.  Prescribed Biaxin and Nasonex.  Symptoms improved but the cough never completely resolved.  Now the sinus symptoms have returned.  Increased sinus pressure and nasal congestion.  Ears full.  No sore throat, but does report increased post nasal drainage.  Increased cough.  No chest congestion, tightness or sob.  Describes throat congestion.  No vomiting.  Eating and drinking well.  No fever.    Past Medical History  Diagnosis Date  . HTN (hypertension)     120/80's at home  . Diabetes mellitus     Review of Systems Patient denies any lightheadedness or dizziness.  She does report increased sinus pressure and symptoms as outlined.  No chest pain, tightness or palpatations.  No increased shortness of breath.  No acid reflux.  No nausea or vomiting.  No abdominal pain or cramping.  No bowel change.  She denies smoking.  Parents smoked.       Objective:   Physical Exam. Filed Vitals:   11/08/11 0907  BP: 136/84  Pulse: 80  Temp: 98.1 F (36.7 C)   71year old female in no acute distress.   HEENT:  Nares - clear except slightly erythematous turbinates.  TMs without erythema.  OP- without lesions or erythema.  NECK:  Supple, nontender.    HEART:  Appears to be regular. LUNGS:  Without crackles or wheezing audible.  No wheezing with forced expiration.  Respirations even and unlabored.                     Assessment & Plan:  SINUSITIS/URI.  Symptoms and exam as outlined.  Treat with Omnicef 300mg  2/day x 10 days.  Continue Nasonex.  Saline nasal spray - flush nose at least 2-3x/day.  Mucinex in the am and Robitussin in the evening.  Rest.  Fluids.  Explained to her if  symptoms changed, worsened or did not resolve - she was to be reevaluated.

## 2011-12-18 ENCOUNTER — Other Ambulatory Visit (INDEPENDENT_AMBULATORY_CARE_PROVIDER_SITE_OTHER): Payer: Medicare Other

## 2011-12-18 DIAGNOSIS — E039 Hypothyroidism, unspecified: Secondary | ICD-10-CM

## 2011-12-18 DIAGNOSIS — E785 Hyperlipidemia, unspecified: Secondary | ICD-10-CM

## 2011-12-18 DIAGNOSIS — E119 Type 2 diabetes mellitus without complications: Secondary | ICD-10-CM

## 2011-12-18 DIAGNOSIS — D696 Thrombocytopenia, unspecified: Secondary | ICD-10-CM

## 2011-12-18 LAB — COMPREHENSIVE METABOLIC PANEL
ALT: 23 U/L (ref 0–35)
CO2: 24 mEq/L (ref 19–32)
Calcium: 9.3 mg/dL (ref 8.4–10.5)
Chloride: 107 mEq/L (ref 96–112)
GFR: 74.03 mL/min (ref >60.00)
Sodium: 137 mEq/L (ref 135–145)
Total Protein: 6.7 g/dL (ref 6.0–8.3)

## 2011-12-18 LAB — LIPID PANEL
Cholesterol: 119 mg/dL (ref 0–200)
HDL: 44.1 mg/dL (ref >39.00)
LDL Cholesterol: 63 mg/dL (ref 0–99)
Total CHOL/HDL Ratio: 3
Triglycerides: 62 mg/dL (ref 0.0–149.0)

## 2011-12-18 LAB — CBC WITH DIFFERENTIAL/PLATELET
Basophils Absolute: 0 10*3/uL (ref 0.0–0.1)
Lymphocytes Relative: 27.1 % (ref 12.0–46.0)
Lymphs Abs: 1.9 10*3/uL (ref 0.7–4.0)
Monocytes Relative: 10.1 % (ref 3.0–12.0)
Platelets: 121 10*3/uL — ABNORMAL LOW (ref 150.0–400.0)
RDW: 13.4 % (ref 11.5–14.6)

## 2011-12-18 LAB — HEMOGLOBIN A1C: Hgb A1c MFr Bld: 7 % — ABNORMAL HIGH (ref 4.6–6.5)

## 2011-12-18 LAB — MICROALBUMIN / CREATININE URINE RATIO: Microalb Creat Ratio: 0.4 mg/g (ref 0.0–30.0)

## 2011-12-18 NOTE — Addendum Note (Signed)
Addended by: Montine Circle D on: 12/18/2011 09:03 AM   Modules accepted: Orders

## 2011-12-24 ENCOUNTER — Encounter: Payer: Self-pay | Admitting: Internal Medicine

## 2011-12-24 ENCOUNTER — Ambulatory Visit (INDEPENDENT_AMBULATORY_CARE_PROVIDER_SITE_OTHER): Payer: Medicare Other | Admitting: Internal Medicine

## 2011-12-24 VITALS — BP 140/80 | HR 63 | Temp 98.1°F | Resp 16 | Wt 225.0 lb

## 2011-12-24 DIAGNOSIS — Z1331 Encounter for screening for depression: Secondary | ICD-10-CM

## 2011-12-24 DIAGNOSIS — E669 Obesity, unspecified: Secondary | ICD-10-CM

## 2011-12-24 DIAGNOSIS — D696 Thrombocytopenia, unspecified: Secondary | ICD-10-CM

## 2011-12-24 DIAGNOSIS — E119 Type 2 diabetes mellitus without complications: Secondary | ICD-10-CM

## 2011-12-24 DIAGNOSIS — I10 Essential (primary) hypertension: Secondary | ICD-10-CM

## 2011-12-24 MED ORDER — METFORMIN HCL ER 500 MG PO TB24: 500.0000 mg | ORAL_TABLET | Freq: Every day | ORAL | Status: DC

## 2011-12-24 NOTE — Assessment & Plan Note (Signed)
BP slightly above goal today, but generally has been well controlled at home. Will plan to recheck BP at visit in 3 months. Pt will call if BP running consistently >140/90 at home. Plan to consider changing to ACEi at next visit. Avoiding making multiple medication changes this visit.

## 2011-12-24 NOTE — Assessment & Plan Note (Signed)
Platelets have typically been 130s over the last 2 years, most recent count 121.  Other blood counts normal. No easy bleeding or bruising. Will plan to repeat counts with labs in 3 months. Pt will call sooner if any bleeding or bruising.

## 2011-12-24 NOTE — Assessment & Plan Note (Signed)
BMI 35. Recommended Mediterranean style diet and increased physical activity with goal of 30-46min 5 days per week. Follow up 3 months.

## 2011-12-24 NOTE — Assessment & Plan Note (Signed)
Blood sugar is above goal with A1c of 7%. Will start Metformin ER 500mg  daily. Pt will monitor BG 1-2 times per week and email if BG>150 fasting. Repeat A1c in 3 months.

## 2011-12-24 NOTE — Progress Notes (Signed)
Subjective:    Patient ID: Courtney Stone, female    DOB: 1940/06/06, 71 y.o.   MRN: [id number]  HPI 71YO female with DM, HTN presents for follow up. Reports BG typically near 140 fasting. No sugars >200. Has not been following low-carb diet or exercise plan.  Has never taken medication for DM. Denies any concerns today. Normal energy level.  Outpatient Encounter Prescriptions as of 12/24/2011  Medication Sig Dispense Refill  . Calcium Carbonate-Vitamin D (CALCIUM 600+D) 600-400 MG-UNIT per tablet Take 1 tablet by mouth 2 (two) times daily.        . cefdinir (OMNICEF) 300 MG capsule Take 1 capsule (300 mg total) by mouth 2 (two) times daily.  20 capsule  0  . glucose blood test strip Use as instructed  100 each  12  . mometasone (NASONEX) 50 MCG/ACT nasal spray Place 2 sprays into the nose daily.  17 g  1  . Multiple Vitamins-Minerals (CENTRUM SILVER PO) Take 1 tablet by mouth daily.      Marland Kitchen triamterene-hydrochlorothiazide (MAXZIDE-25) 37.5-25 MG per tablet Take 1 each (1 tablet total) by mouth daily.  90 tablet  3  . vitamin C (ASCORBIC ACID) 500 MG tablet Take 500 mg by mouth daily.        . metFORMIN (GLUCOPHAGE-XR) 500 MG 24 hr tablet Take 1 tablet (500 mg total) by mouth daily with breakfast.  30 tablet  6   BP 140/80  Pulse 63  Temp 98.1 F (36.7 C) (Oral)  Resp 16  Wt 225 lb (102.059 kg)  Review of Systems  Constitutional: Negative for fever, chills, appetite change, fatigue and unexpected weight change.  HENT: Negative for ear pain, congestion, sore throat, trouble swallowing, neck pain, voice change and sinus pressure.   Eyes: Negative for visual disturbance.  Respiratory: Negative for cough, shortness of breath, wheezing and stridor.   Cardiovascular: Negative for chest pain, palpitations and leg swelling.  Gastrointestinal: Negative for nausea, vomiting, abdominal pain, diarrhea, constipation, blood in stool, abdominal distention and anal bleeding.  Genitourinary: Negative for  dysuria and flank pain.  Musculoskeletal: Negative for myalgias, arthralgias and gait problem.  Skin: Negative for color change and rash.  Neurological: Negative for dizziness and headaches.  Hematological: Negative for adenopathy. Does not bruise/bleed easily.  Psychiatric/Behavioral: Negative for suicidal ideas, sleep disturbance and dysphoric mood. The patient is not nervous/anxious.        Objective:   Physical Exam  Constitutional: She is oriented to person, place, and time. She appears well-developed and well-nourished. No distress.  HENT:  Head: Normocephalic and atraumatic.  Right Ear: External ear normal.  Left Ear: External ear normal.  Nose: Nose normal.  Mouth/Throat: Oropharynx is clear and moist. No oropharyngeal exudate.  Eyes: Conjunctivae normal are normal. Pupils are equal, round, and reactive to light. Right eye exhibits no discharge. Left eye exhibits no discharge. No scleral icterus.  Neck: Normal range of motion. Neck supple. No tracheal deviation present. No thyromegaly present.  Cardiovascular: Normal rate, regular rhythm, normal heart sounds and intact distal pulses.  Exam reveals no gallop and no friction rub.   No murmur heard. Pulmonary/Chest: Effort normal and breath sounds normal. No respiratory distress. She has no wheezes. She has no rales. She exhibits no tenderness.  Musculoskeletal: Normal range of motion. She exhibits no edema and no tenderness.  Lymphadenopathy:    She has no cervical adenopathy.  Neurological: She is alert and oriented to person, place, and time. No cranial nerve deficit. She  exhibits normal muscle tone. Coordination normal.  Skin: Skin is warm and dry. No rash noted. She is not diaphoretic. No erythema. No pallor.  Psychiatric: She has a normal mood and affect. Her behavior is normal. Judgment and thought content normal.          Assessment & Plan:

## 2012-03-17 ENCOUNTER — Other Ambulatory Visit: Payer: Medicare Other

## 2012-03-24 ENCOUNTER — Ambulatory Visit: Payer: Medicare Other | Admitting: Internal Medicine

## 2012-03-27 ENCOUNTER — Other Ambulatory Visit: Payer: Medicare Other

## 2012-04-03 ENCOUNTER — Ambulatory Visit: Payer: Medicare Other | Admitting: Internal Medicine

## 2012-04-03 ENCOUNTER — Other Ambulatory Visit (INDEPENDENT_AMBULATORY_CARE_PROVIDER_SITE_OTHER): Payer: Medicare Other

## 2012-04-03 DIAGNOSIS — E119 Type 2 diabetes mellitus without complications: Secondary | ICD-10-CM

## 2012-04-03 DIAGNOSIS — D696 Thrombocytopenia, unspecified: Secondary | ICD-10-CM

## 2012-04-03 LAB — CBC WITH DIFFERENTIAL/PLATELET
Basophils Relative: 0.5 % (ref 0.0–3.0)
Eosinophils Relative: 1.2 % (ref 0.0–5.0)
HCT: 43 % (ref 36.0–46.0)
Hemoglobin: 14.8 g/dL (ref 12.0–15.0)
Lymphocytes Relative: 28.8 % (ref 12.0–46.0)
Lymphs Abs: 2.2 10*3/uL (ref 0.7–4.0)
Monocytes Relative: 8.9 % (ref 3.0–12.0)
Neutro Abs: 4.6 10*3/uL (ref 1.4–7.7)
RBC: 4.83 Mil/uL (ref 3.87–5.11)

## 2012-04-03 LAB — COMPREHENSIVE METABOLIC PANEL
AST: 24 U/L (ref 0–37)
BUN: 22 mg/dL (ref 6–23)
CO2: 26 mEq/L (ref 19–32)
Calcium: 9.4 mg/dL (ref 8.4–10.5)
Chloride: 106 mEq/L (ref 96–112)
Creatinine, Ser: 0.9 mg/dL (ref 0.4–1.2)
GFR: 65.5 mL/min (ref >60.00)
Total Bilirubin: 1.4 mg/dL — ABNORMAL HIGH (ref 0.3–1.2)

## 2012-04-03 LAB — HEMOGLOBIN A1C: Hgb A1c MFr Bld: 7.5 % — ABNORMAL HIGH (ref 4.6–6.5)

## 2012-04-10 ENCOUNTER — Encounter: Payer: Self-pay | Admitting: Internal Medicine

## 2012-04-10 ENCOUNTER — Ambulatory Visit (INDEPENDENT_AMBULATORY_CARE_PROVIDER_SITE_OTHER): Payer: Medicare Other | Admitting: Internal Medicine

## 2012-04-10 VITALS — BP 130/84 | HR 75 | Temp 98.2°F | Wt 227.0 lb

## 2012-04-10 DIAGNOSIS — I10 Essential (primary) hypertension: Secondary | ICD-10-CM

## 2012-04-10 DIAGNOSIS — R600 Localized edema: Secondary | ICD-10-CM

## 2012-04-10 DIAGNOSIS — D696 Thrombocytopenia, unspecified: Secondary | ICD-10-CM

## 2012-04-10 DIAGNOSIS — E119 Type 2 diabetes mellitus without complications: Secondary | ICD-10-CM

## 2012-04-10 DIAGNOSIS — R609 Edema, unspecified: Secondary | ICD-10-CM

## 2012-04-10 NOTE — Assessment & Plan Note (Signed)
Edema of left greater than right lower extremity most consistent with chronic venous insufficiency. Given strong family history of lymphedema, will set up vascular evaluation with bilateral lower extremity venous ultrasound. Will start compression stockings, prescription given today. Encouraged her to limit salt intake and keep legs elevated when possible. Followup in 3 months or sooner as needed.

## 2012-04-10 NOTE — Progress Notes (Signed)
Subjective:    Patient ID: Courtney Stone, female    DOB: [birth year], 72 y.o.   MRN: [id number]  HPI 72 year old female with history of diabetes, hypertension presents for followup. She reports she is generally feeling well. Her lipids are in today is recent increase in lower extremity edema. This is been ongoing for a couple of months. She first noticed the edema in her legs in December after taking a trip where she was seated for prolonged periods of time. The edema then seemed to resolve. Over the last couple of weeks, she has noted that with prolonged sitting or standing she has edema in her ankles worse in her left leg versus right leg. She notes that both her sister and mother have lymphedema. She has never warm compression stockings. She denies change in salt intake. She is compliant with blood pressure medications. Denies dyspnea, chest pain or other concerns.  In regards to diabetes, she reports blood sugars are typically near 100-120. She is compliant with metformin. She denies any low blood sugars or blood sugars greater than 200. She is trying to follow a diet low in processed carbohydrates.  Outpatient Encounter Prescriptions as of 04/10/2012  Medication Sig Dispense Refill  . Calcium Carbonate-Vitamin D (CALCIUM 600+D) 600-400 MG-UNIT per tablet Take 1 tablet by mouth 2 (two) times daily.        Marland Kitchen glucose blood test strip Use as instructed  100 each  12  . metFORMIN (GLUCOPHAGE-XR) 500 MG 24 hr tablet Take 1 tablet (500 mg total) by mouth daily with breakfast.  30 tablet  6  . mometasone (NASONEX) 50 MCG/ACT nasal spray Place 2 sprays into the nose daily.  17 g  1  . Multiple Vitamins-Minerals (CENTRUM SILVER PO) Take 1 tablet by mouth daily.      Marland Kitchen triamterene-hydrochlorothiazide (MAXZIDE-25) 37.5-25 MG per tablet Take 1 each (1 tablet total) by mouth daily.  90 tablet  3  . vitamin C (ASCORBIC ACID) 500 MG tablet Take 500 mg by mouth daily.        . [DISCONTINUED] cefdinir (OMNICEF)  300 MG capsule Take 1 capsule (300 mg total) by mouth 2 (two) times daily.  20 capsule  0   No facility-administered encounter medications on file as of 04/10/2012.    Review of Systems  Constitutional: Negative for fever, chills, appetite change, fatigue and unexpected weight change.  HENT: Negative for ear pain, congestion, sore throat, trouble swallowing, neck pain, voice change and sinus pressure.   Eyes: Negative for visual disturbance.  Respiratory: Negative for cough, shortness of breath, wheezing and stridor.   Cardiovascular: Positive for leg swelling. Negative for chest pain and palpitations.  Gastrointestinal: Negative for nausea, vomiting, abdominal pain, diarrhea, constipation, blood in stool, abdominal distention and anal bleeding.  Genitourinary: Negative for dysuria and flank pain.  Musculoskeletal: Negative for myalgias, arthralgias and gait problem.  Skin: Negative for color change and rash.  Neurological: Negative for dizziness and headaches.  Hematological: Negative for adenopathy. Does not bruise/bleed easily.  Psychiatric/Behavioral: Negative for suicidal ideas, sleep disturbance and dysphoric mood. The patient is not nervous/anxious.        Objective:   Physical Exam  Constitutional: She is oriented to person, place, and time. She appears well-developed and well-nourished. No distress.  HENT:  Head: Normocephalic and atraumatic.  Right Ear: External ear normal.  Left Ear: External ear normal.  Nose: Nose normal.  Mouth/Throat: Oropharynx is clear and moist. No oropharyngeal exudate.  Eyes: Conjunctivae are normal. Pupils  are equal, round, and reactive to light. Right eye exhibits no discharge. Left eye exhibits no discharge. No scleral icterus.  Neck: Normal range of motion. Neck supple. No tracheal deviation present. No thyromegaly present.  Cardiovascular: Normal rate, regular rhythm, normal heart sounds and intact distal pulses.  Exam reveals no gallop and no  friction rub.   No murmur heard. Pulmonary/Chest: Effort normal and breath sounds normal. No respiratory distress. She has no wheezes. She has no rales. She exhibits no tenderness.  Musculoskeletal: Normal range of motion. She exhibits edema (pitting bilateral ankles left>right). She exhibits no tenderness.  Lymphadenopathy:    She has no cervical adenopathy.  Neurological: She is alert and oriented to person, place, and time. No cranial nerve deficit. She exhibits normal muscle tone. Coordination normal.  Skin: Skin is warm and dry. No rash noted. She is not diaphoretic. No erythema. No pallor.  Psychiatric: She has a normal mood and affect. Her behavior is normal. Judgment and thought content normal.          Assessment & Plan:

## 2012-04-10 NOTE — Assessment & Plan Note (Signed)
Lab Results  Component Value Date   HGBA1C 7.5* 04/03/2012   Blood sugar slightly increased compared to previous. Encouraged better compliance with diet low in processed carbohydrates. We discussed potentially adding glipizide in the future of blood sugars persistently elevated. Followup in 3 months.

## 2012-04-10 NOTE — Assessment & Plan Note (Signed)
BP Readings from Last 3 Encounters:  04/10/12 130/84  12/24/11 140/80  11/08/11 136/84   Blood pressure is generally well controlled with Maxzide. We'll plan to continue. She is taking half tablet daily. Consider changing to lisinopril hydrochlorothiazide at next visit, in effort to add ACEi to regimen.

## 2012-04-10 NOTE — Assessment & Plan Note (Signed)
Recent platelet count slightly improved compared to previous.

## 2012-04-14 ENCOUNTER — Encounter: Payer: Self-pay | Admitting: Emergency Medicine

## 2012-07-13 ENCOUNTER — Other Ambulatory Visit (INDEPENDENT_AMBULATORY_CARE_PROVIDER_SITE_OTHER): Payer: Medicare Other

## 2012-07-13 DIAGNOSIS — E119 Type 2 diabetes mellitus without complications: Secondary | ICD-10-CM

## 2012-07-13 LAB — COMPREHENSIVE METABOLIC PANEL
ALT: 25 U/L (ref 0–35)
AST: 23 U/L (ref 0–37)
Albumin: 3.9 g/dL (ref 3.5–5.2)
BUN: 16 mg/dL (ref 6–23)
Calcium: 9.1 mg/dL (ref 8.4–10.5)
Chloride: 109 mEq/L (ref 96–112)
Potassium: 4.3 mEq/L (ref 3.5–5.1)
Sodium: 142 mEq/L (ref 135–145)
Total Protein: 6.4 g/dL (ref 6.0–8.3)

## 2012-07-15 ENCOUNTER — Ambulatory Visit (INDEPENDENT_AMBULATORY_CARE_PROVIDER_SITE_OTHER): Payer: Medicare Other | Admitting: Internal Medicine

## 2012-07-15 ENCOUNTER — Encounter: Payer: Self-pay | Admitting: Internal Medicine

## 2012-07-15 VITALS — BP 168/98 | HR 65 | Temp 98.3°F | Wt 224.0 lb

## 2012-07-15 DIAGNOSIS — R609 Edema, unspecified: Secondary | ICD-10-CM

## 2012-07-15 DIAGNOSIS — E119 Type 2 diabetes mellitus without complications: Secondary | ICD-10-CM

## 2012-07-15 DIAGNOSIS — I10 Essential (primary) hypertension: Secondary | ICD-10-CM

## 2012-07-15 DIAGNOSIS — R600 Localized edema: Secondary | ICD-10-CM

## 2012-07-15 DIAGNOSIS — J309 Allergic rhinitis, unspecified: Secondary | ICD-10-CM

## 2012-07-15 MED ORDER — METFORMIN HCL ER 500 MG PO TB24: 500.0000 mg | ORAL_TABLET | Freq: Every day | ORAL | Status: DC

## 2012-07-15 MED ORDER — MOMETASONE FUROATE 50 MCG/ACT NA SUSP: 2.0000 | Freq: Every day | NASAL | Status: DC

## 2012-07-15 NOTE — Assessment & Plan Note (Signed)
BP Readings from Last 3 Encounters:  07/15/12 168/98  04/10/12 130/84  12/24/11 140/80   Blood pressure slightly elevated today however patient reports some noncompliance of medication while recently on vacation. She will get back on regular dosing of Maxide. We discussed changing Maxzide to ACE inhibitor plus hydrochlorothiazide. She would prefer to hold off on this as she has several months of Maxide left. When she completes this medication we'll plan to change to lisinopril hydrochlorothiazide.

## 2012-07-15 NOTE — Progress Notes (Signed)
Subjective:    Patient ID: Courtney Stone, female    DOB: 24-Aug-1940, 72 y.o.   MRN: [id number]  HPI 72 year old female with history of diabetes, hypertension, obesity presents for followup. She reports blood sugars have been better controlled with use of metformin, typically around 90 fasting. She denies any side effects from this medication. She notes that she was recently on vacation at the beach which was very stressful for her. She reports that blood pressure has been slightly higher than normal recently. She denies any chest pain, headache, palpitations. She continues to have lower extremities swelling in her left leg greater than right leg. She has not recently been wearing compression stockings.  Outpatient Encounter Prescriptions as of 07/15/2012  Medication Sig Dispense Refill  . Calcium Carbonate-Vitamin D (CALCIUM 600+D) 600-400 MG-UNIT per tablet Take 1 tablet by mouth 2 (two) times daily.        Marland Kitchen glucose blood test strip Use as instructed  100 each  12  . metFORMIN (GLUCOPHAGE-XR) 500 MG 24 hr tablet Take 1 tablet (500 mg total) by mouth at bedtime.  90 tablet  4  . mometasone (NASONEX) 50 MCG/ACT nasal spray Place 2 sprays into the nose daily.  17 g  6  . Multiple Vitamins-Minerals (CENTRUM SILVER PO) Take 1 tablet by mouth daily.      Marland Kitchen triamterene-hydrochlorothiazide (MAXZIDE-25) 37.5-25 MG per tablet Take 1 each (1 tablet total) by mouth daily.  90 tablet  3  . vitamin C (ASCORBIC ACID) 500 MG tablet Take 500 mg by mouth daily.         No facility-administered encounter medications on file as of 07/15/2012.   BP 168/98  Pulse 65  Temp(Src) 98.3 F (36.8 C) (Oral)  Wt 224 lb (101.606 kg)  BMI 35.08 kg/m2  SpO2 95%  Review of Systems  Constitutional: Negative for fever, chills, appetite change, fatigue and unexpected weight change.  HENT: Negative for ear pain, congestion, sore throat, trouble swallowing, neck pain, voice change and sinus pressure.   Eyes: Negative for  visual disturbance.  Respiratory: Negative for cough, shortness of breath, wheezing and stridor.   Cardiovascular: Positive for leg swelling. Negative for chest pain and palpitations.  Gastrointestinal: Negative for nausea, vomiting, abdominal pain, diarrhea, constipation, blood in stool, abdominal distention and anal bleeding.  Genitourinary: Negative for dysuria and flank pain.  Musculoskeletal: Negative for myalgias, arthralgias and gait problem.  Skin: Negative for color change and rash.  Neurological: Negative for dizziness and headaches.  Hematological: Negative for adenopathy. Does not bruise/bleed easily.  Psychiatric/Behavioral: Negative for suicidal ideas, sleep disturbance and dysphoric mood. The patient is not nervous/anxious.        Objective:   Physical Exam  Constitutional: She is oriented to person, place, and time. She appears well-developed and well-nourished. No distress.  HENT:  Head: Normocephalic and atraumatic.  Right Ear: External ear normal.  Left Ear: External ear normal.  Nose: Nose normal.  Mouth/Throat: Oropharynx is clear and moist. No oropharyngeal exudate.  Eyes: Conjunctivae are normal. Pupils are equal, round, and reactive to light. Right eye exhibits no discharge. Left eye exhibits no discharge. No scleral icterus.  Neck: Normal range of motion. Neck supple. No tracheal deviation present. No thyromegaly present.  Cardiovascular: Normal rate, regular rhythm, normal heart sounds and intact distal pulses.  Exam reveals no gallop and no friction rub.   No murmur heard. Pulmonary/Chest: Effort normal and breath sounds normal. No accessory muscle usage. Not tachypneic. No respiratory distress. She has  no decreased breath sounds. She has no wheezes. She has no rhonchi. She has no rales. She exhibits no tenderness.  Musculoskeletal: Normal range of motion. She exhibits edema (non-pitting trace LLE around ankle). She exhibits no tenderness.  Lymphadenopathy:     She has no cervical adenopathy.  Neurological: She is alert and oriented to person, place, and time. No cranial nerve deficit. She exhibits normal muscle tone. Coordination normal.  Skin: Skin is warm and dry. No rash noted. She is not diaphoretic. No erythema. No pallor.  Psychiatric: She has a normal mood and affect. Her behavior is normal. Judgment and thought content normal.          Assessment & Plan:

## 2012-07-15 NOTE — Assessment & Plan Note (Signed)
Persistent mild left greater than right lower extremity edema. Encouraged use of compression stockings. Reviewed recent notes from vascular surgery. Discussed potential use of lymphatic pump in the future on the left lower extremity if symptoms persist.

## 2012-07-15 NOTE — Assessment & Plan Note (Signed)
Lab Results  Component Value Date   HGBA1C 6.9* 07/13/2012   Blood sugar much improved on metformin. Will continue. Plan to repeat A1c in 3 months.

## 2012-10-06 ENCOUNTER — Other Ambulatory Visit: Payer: Self-pay | Admitting: Internal Medicine

## 2012-10-06 NOTE — Telephone Encounter (Signed)
Eprescribed.

## 2012-10-19 ENCOUNTER — Ambulatory Visit: Payer: Medicare Other | Admitting: Internal Medicine

## 2012-10-21 ENCOUNTER — Encounter: Payer: Self-pay | Admitting: Internal Medicine

## 2012-10-21 ENCOUNTER — Ambulatory Visit (INDEPENDENT_AMBULATORY_CARE_PROVIDER_SITE_OTHER): Payer: Medicare Other | Admitting: Internal Medicine

## 2012-10-21 VITALS — BP 140/82 | HR 62 | Temp 98.5°F | Wt 225.0 lb

## 2012-10-21 DIAGNOSIS — Z23 Encounter for immunization: Secondary | ICD-10-CM

## 2012-10-21 DIAGNOSIS — I10 Essential (primary) hypertension: Secondary | ICD-10-CM

## 2012-10-21 DIAGNOSIS — E119 Type 2 diabetes mellitus without complications: Secondary | ICD-10-CM

## 2012-10-21 LAB — MICROALBUMIN / CREATININE URINE RATIO
Creatinine,U: 66.1 mg/dL
Microalb Creat Ratio: 1.4 mg/g (ref 0.0–30.0)
Microalb, Ur: 0.9 mg/dL (ref 0.0–1.9)

## 2012-10-21 LAB — COMPREHENSIVE METABOLIC PANEL
AST: 25 U/L (ref 0–37)
Albumin: 3.9 g/dL (ref 3.5–5.2)
BUN: 17 mg/dL (ref 6–23)
CO2: 27 mEq/L (ref 19–32)
Calcium: 9.3 mg/dL (ref 8.4–10.5)
Chloride: 108 mEq/L (ref 96–112)
GFR: 72.82 mL/min (ref >60.00)
Glucose, Bld: 109 mg/dL — ABNORMAL HIGH (ref 70–99)
Potassium: 4 mEq/L (ref 3.5–5.1)
Total Bilirubin: 1.1 mg/dL (ref 0.3–1.2)

## 2012-10-21 LAB — HEMOGLOBIN A1C: Hgb A1c MFr Bld: 7.2 % — ABNORMAL HIGH (ref 4.6–6.5)

## 2012-10-21 NOTE — Assessment & Plan Note (Addendum)
BP Readings from Last 3 Encounters:  10/21/12 140/82  07/15/12 168/98  04/10/12 130/84   Blood pressure generally has been well-controlled. However, better controlled at home versus during office visits. We'll continue to monitor. If any persistent blood pressure greater than 140/90, patient will call. Discussed changing to lisinopril hydrochlorothiazide because of potential renal protection with ACE inhibitor. Patient prefers to wait until she has completed this prescription of triamterene HCTZ.

## 2012-10-21 NOTE — Assessment & Plan Note (Signed)
Patient reports good control of blood sugars. Will check A1c with labs today. Continue metformin. Foot exam normal today.

## 2012-10-21 NOTE — Progress Notes (Signed)
Subjective:    Patient ID: Courtney Stone, female    DOB: Apr 12, 1940, 72 y.o.   MRN: [id number]  HPI 72 year old female with history of diabetes and hypertension presents for followup. She reports that she is generally feeling well. Blood sugars have been mostly near 110 fasting. She is compliant with metformin. She denies any new concerns today.   Outpatient Encounter Prescriptions as of 10/21/2012  Medication Sig Dispense Refill  . Calcium Carbonate-Vitamin D (CALCIUM 600+D) 600-400 MG-UNIT per tablet Take 1 tablet by mouth 2 (two) times daily.        . metFORMIN (GLUCOPHAGE-XR) 500 MG 24 hr tablet Take 1 tablet (500 mg total) by mouth at bedtime.  90 tablet  4  . mometasone (NASONEX) 50 MCG/ACT nasal spray Place 2 sprays into the nose daily.  17 g  6  . Multiple Vitamins-Minerals (CENTRUM SILVER PO) Take 1 tablet by mouth daily.      Marland Kitchen triamterene-hydrochlorothiazide (MAXZIDE-25) 37.5-25 MG per tablet       . vitamin C (ASCORBIC ACID) 500 MG tablet Take 500 mg by mouth daily.         No facility-administered encounter medications on file as of 10/21/2012.   BP 140/82  Pulse 62  Temp(Src) 98.5 F (36.9 C) (Oral)  Wt 225 lb (102.059 kg)  BMI 35.23 kg/m2  SpO2 95%  Review of Systems  Constitutional: Negative for fever, chills, appetite change, fatigue and unexpected weight change.  HENT: Negative for ear pain, congestion, sore throat, trouble swallowing, neck pain, voice change and sinus pressure.   Eyes: Negative for visual disturbance.  Respiratory: Negative for cough, shortness of breath, wheezing and stridor.   Cardiovascular: Negative for chest pain, palpitations and leg swelling.  Gastrointestinal: Negative for nausea, vomiting, abdominal pain, diarrhea, constipation, blood in stool, abdominal distention and anal bleeding.  Genitourinary: Negative for dysuria and flank pain.  Musculoskeletal: Negative for myalgias, arthralgias and gait problem.  Skin: Negative for color change  and rash.  Neurological: Negative for dizziness and headaches.  Hematological: Negative for adenopathy. Does not bruise/bleed easily.  Psychiatric/Behavioral: Negative for suicidal ideas, sleep disturbance and dysphoric mood. The patient is not nervous/anxious.        Objective:   Physical Exam  Constitutional: She is oriented to person, place, and time. She appears well-developed and well-nourished. No distress.  HENT:  Head: Normocephalic and atraumatic.  Right Ear: External ear normal.  Left Ear: External ear normal.  Nose: Nose normal.  Mouth/Throat: Oropharynx is clear and moist. No oropharyngeal exudate.  Eyes: Conjunctivae are normal. Pupils are equal, round, and reactive to light. Right eye exhibits no discharge. Left eye exhibits no discharge. No scleral icterus.  Neck: Normal range of motion. Neck supple. No tracheal deviation present. No thyromegaly present.  Cardiovascular: Normal rate, regular rhythm, normal heart sounds and intact distal pulses.  Exam reveals no gallop and no friction rub.   No murmur heard. Pulmonary/Chest: Effort normal and breath sounds normal. No accessory muscle usage. Not tachypneic. No respiratory distress. She has no decreased breath sounds. She has no wheezes. She has no rhonchi. She has no rales. She exhibits no tenderness.  Musculoskeletal: Normal range of motion. She exhibits no edema and no tenderness.  Lymphadenopathy:    She has no cervical adenopathy.  Neurological: She is alert and oriented to person, place, and time. No cranial nerve deficit. She exhibits normal muscle tone. Coordination normal.  Skin: Skin is warm and dry. No rash noted. She is not diaphoretic.  No erythema. No pallor.  Psychiatric: She has a normal mood and affect. Her behavior is normal. Judgment and thought content normal.          Assessment & Plan:

## 2012-12-04 ENCOUNTER — Telehealth: Payer: Self-pay | Admitting: Internal Medicine

## 2012-12-04 NOTE — Telephone Encounter (Signed)
Needs ARB

## 2013-02-05 ENCOUNTER — Ambulatory Visit (INDEPENDENT_AMBULATORY_CARE_PROVIDER_SITE_OTHER): Payer: Medicare Other | Admitting: Internal Medicine

## 2013-02-05 ENCOUNTER — Encounter: Payer: Self-pay | Admitting: Internal Medicine

## 2013-02-05 VITALS — BP 126/76 | HR 66 | Temp 98.5°F | Ht 66.75 in | Wt 225.0 lb

## 2013-02-05 DIAGNOSIS — I10 Essential (primary) hypertension: Secondary | ICD-10-CM

## 2013-02-05 DIAGNOSIS — Z0000 Encounter for general adult medical examination without abnormal findings: Secondary | ICD-10-CM

## 2013-02-05 DIAGNOSIS — E119 Type 2 diabetes mellitus without complications: Secondary | ICD-10-CM

## 2013-02-05 LAB — HM DIABETES FOOT EXAM: HM DIABETIC FOOT EXAM: NORMAL

## 2013-02-05 LAB — HM DIABETES EYE EXAM

## 2013-02-05 MED ORDER — LISINOPRIL-HYDROCHLOROTHIAZIDE 10-12.5 MG PO TABS: 1.0000 | ORAL_TABLET | Freq: Every day | ORAL | Status: DC

## 2013-02-05 NOTE — Progress Notes (Signed)
Subjective:    Patient ID: Courtney Stone, female    DOB: [birth year], 73 y.o.   MRN: [id number]  HPI The patient is here for annual Medicare wellness examination and management of other chronic and acute problems.   The risk factors are reflected in the social history.  The roster of all physicians providing medical care to patient - is listed in the Snapshot section of the chart.  Activities of daily living:  The patient is 100% independent in all ADLs: dressing, toileting, feeding as well as independent mobility. Lives with husband and cat.  Home safety : The patient has smoke detectors in the home. They wear seatbelts.  There are no firearms at home. There is no violence in the home.   There is no risks for hepatitis, STDs or HIV. There is no history of blood transfusion. They have no travel history to infectious disease endemic areas of the world.  The patient has seen their dentist in the last six month Courtney Stone, dentistl).  They have seen their eye doctor in the last year (Dr. Dorcas Stone).  Hearing testing in distant past, occasional trouble hearing whispered voice, followed by ENT. (Dr. Andee Stone)  They do not  have excessive sun exposure. Discussed the need for sun protection: hats, long sleeves and use of sunscreen if there is significant sun exposure. Dermatologist - Dr. Jarold Stone  Diet: the importance of a healthy diet is discussed. They do have a relatively healthy diet.  The benefits of regular aerobic exercise were discussed. No regular exercise. Walking occasionally.  Depression screen: there are no signs or vegative symptoms of depression- irritability, change in appetite, anhedonia, sadness/tearfullness.  Cognitive assessment: the patient manages all their financial and personal affairs and is actively engaged. They could relate day,date,year and events; performed clock-face test normally. Volunteers at school, teaching math.  HCPOA - husband, [name]  The following portions of the patient's history were reviewed and updated as appropriate: allergies, current medications, past family history, past medical history,  past surgical history, past social history  and problem list.  Visual acuity was not assessed per patient preference since she has regular follow up with her ophthalmologist. Hearing and body mass index were assessed and reviewed.   During the course of the visit the patient was educated and counseled about appropriate screening and preventive services including : fall prevention , diabetes screening, nutrition counseling, colorectal cancer screening, and recommended immunizations.    Outpatient Encounter Prescriptions as of 02/05/2013  Medication Sig  . Calcium Carbonate-Vitamin D (CALCIUM 600+D) 600-400 MG-UNIT per tablet Take 1 tablet by mouth 2 (two) times daily.    . metFORMIN (GLUCOPHAGE-XR) 500 MG 24 hr tablet Take 1 tablet (500 mg total) by mouth at bedtime.  . Multiple Vitamins-Minerals (CENTRUM SILVER PO) Take 1 tablet by mouth daily.  Marland Kitchen triamterene-hydrochlorothiazide (MAXZIDE-25) 37.5-25 MG per tablet   . mometasone (NASONEX) 50 MCG/ACT nasal spray Place 2 sprays into the nose daily.  . vitamin C (ASCORBIC ACID) 500 MG tablet Take 500 mg by mouth daily.     BP 126/76  Pulse 66  Temp(Src) 98.5 F (36.9 C) (Oral)  Ht 5' 6.75" (1.695 m)  Wt 225 lb (102.059 kg)  BMI 35.52 kg/m2  SpO2 97%     Review of Systems  Constitutional: Negative for fever, chills, appetite change, fatigue and unexpected weight change.  HENT: Negative for congestion, ear pain, sinus pressure, sore throat, trouble swallowing and voice change.   Eyes: Negative for visual  disturbance.  Respiratory: Negative for cough, shortness of breath, wheezing and stridor.   Cardiovascular: Negative for chest pain, palpitations and leg swelling.  Gastrointestinal: Negative for nausea, vomiting, abdominal pain, diarrhea, constipation, blood in  stool, abdominal distention and anal bleeding.  Genitourinary: Negative for dysuria and flank pain.  Musculoskeletal: Negative for arthralgias, gait problem, myalgias and neck pain.  Skin: Negative for color change and rash.  Neurological: Negative for dizziness and headaches.  Hematological: Negative for adenopathy. Does not bruise/bleed easily.  Psychiatric/Behavioral: Negative for suicidal ideas, sleep disturbance and dysphoric mood. The patient is not nervous/anxious.        Objective:   Physical Exam  Constitutional: She is oriented to person, place, and time. She appears well-developed and well-nourished. No distress.  HENT:  Head: Normocephalic and atraumatic.  Right Ear: External ear normal.  Left Ear: External ear normal.  Nose: Nose normal.  Mouth/Throat: Oropharynx is clear and moist. No oropharyngeal exudate.  Eyes: Conjunctivae are normal. Pupils are equal, round, and reactive to light. Right eye exhibits no discharge. Left eye exhibits no discharge. No scleral icterus.  Neck: Normal range of motion. Neck supple. No tracheal deviation present. No thyromegaly present.  Cardiovascular: Normal rate, regular rhythm, normal heart sounds and intact distal pulses.  Exam reveals no gallop and no friction rub.   No murmur heard. Pulmonary/Chest: Effort normal and breath sounds normal. No accessory muscle usage. Not tachypneic. No respiratory distress. She has no decreased breath sounds. She has no wheezes. She has no rales. She exhibits no tenderness. Right breast exhibits no inverted nipple, no mass, no nipple discharge, no skin change and no tenderness. Left breast exhibits no inverted nipple, no mass, no nipple discharge, no skin change and no tenderness. Breasts are symmetrical.  Abdominal: Soft. Bowel sounds are normal. She exhibits no distension and no mass. There is no tenderness. There is no rebound and no guarding.  Musculoskeletal: Normal range of motion. She exhibits no edema  and no tenderness.  Lymphadenopathy:    She has no cervical adenopathy.  Neurological: She is alert and oriented to person, place, and time. No cranial nerve deficit. She exhibits normal muscle tone. Coordination normal.  Skin: Skin is warm and dry. No rash noted. She is not diaphoretic. No erythema. No pallor.  Psychiatric: She has a normal mood and affect. Her behavior is normal. Judgment and thought content normal.          Assessment & Plan:

## 2013-02-05 NOTE — Progress Notes (Signed)
Pre-visit discussion using our clinic review tool. No additional management support is needed unless otherwise documented below in the visit note.  

## 2013-02-05 NOTE — Assessment & Plan Note (Signed)
  BP Readings from Last 3 Encounters:  02/05/13 126/76  10/21/12 140/82  07/15/12 168/98   BP well controlled on Maxide, however will change to lisinopril-HCTZ in effort to add ACEi for renal protection. Plan to check Cr and K in 1 week. Recheck BP in 1-2 weeks.

## 2013-02-05 NOTE — Assessment & Plan Note (Signed)
General medical exam including breast exam normal today. Pap and pelvic deferred given patient's age and history of normal Pap in the past. Mammogram ordered. Colonoscopy is up-to-date. Encouraged healthy diet and regular physical activity. Immunizations are up to date except for Prevnar, which was recommended.

## 2013-02-05 NOTE — Assessment & Plan Note (Signed)
Lab Results  Component Value Date   HGBA1C 7.2* 10/21/2012   Will check A1c with labs today. Continue metformin. Encouraged low glycemic index diet and regular exercise with goal of 40min 3x per week.

## 2013-02-06 ENCOUNTER — Telehealth: Payer: Self-pay | Admitting: Internal Medicine

## 2013-02-06 NOTE — Telephone Encounter (Signed)
Relevant patient education assigned to patient using Emmi. ° °

## 2013-02-09 ENCOUNTER — Telehealth: Payer: Self-pay

## 2013-02-09 NOTE — Telephone Encounter (Signed)
Relevant patient education assigned to patient using Emmi. ° °

## 2013-02-10 ENCOUNTER — Encounter: Payer: Self-pay | Admitting: Emergency Medicine

## 2013-02-16 ENCOUNTER — Other Ambulatory Visit (INDEPENDENT_AMBULATORY_CARE_PROVIDER_SITE_OTHER): Payer: Medicare Other

## 2013-02-16 ENCOUNTER — Ambulatory Visit (INDEPENDENT_AMBULATORY_CARE_PROVIDER_SITE_OTHER): Payer: Medicare Other | Admitting: *Deleted

## 2013-02-16 VITALS — BP 151/80

## 2013-02-16 DIAGNOSIS — I10 Essential (primary) hypertension: Secondary | ICD-10-CM

## 2013-02-16 DIAGNOSIS — E119 Type 2 diabetes mellitus without complications: Secondary | ICD-10-CM

## 2013-02-16 LAB — COMPREHENSIVE METABOLIC PANEL
ALK PHOS: 72 U/L (ref 39–117)
ALT: 20 U/L (ref 0–35)
AST: 21 U/L (ref 0–37)
Albumin: 3.9 g/dL (ref 3.5–5.2)
BILIRUBIN TOTAL: 1.7 mg/dL — AB (ref 0.3–1.2)
BUN: 17 mg/dL (ref 6–23)
CO2: 28 meq/L (ref 19–32)
Calcium: 9.3 mg/dL (ref 8.4–10.5)
Chloride: 106 mEq/L (ref 96–112)
Creatinine, Ser: 0.8 mg/dL (ref 0.4–1.2)
GFR: 71.74 mL/min (ref >60.00)
Glucose, Bld: 128 mg/dL — ABNORMAL HIGH (ref 70–99)
Potassium: 4.2 mEq/L (ref 3.5–5.1)
Sodium: 139 mEq/L (ref 135–145)
TOTAL PROTEIN: 6.5 g/dL (ref 6.0–8.3)

## 2013-02-16 LAB — HEMOGLOBIN A1C: Hgb A1c MFr Bld: 7.6 % — ABNORMAL HIGH (ref 4.6–6.5)

## 2013-02-16 LAB — LIPID PANEL
CHOL/HDL RATIO: 3
Cholesterol: 125 mg/dL (ref 0–200)
HDL: 43.6 mg/dL (ref >39.00)
LDL CALC: 63 mg/dL (ref 0–99)
Triglycerides: 90 mg/dL (ref 0.0–149.0)
VLDL: 18 mg/dL (ref 0.0–40.0)

## 2013-03-08 ENCOUNTER — Ambulatory Visit: Payer: Self-pay | Admitting: Internal Medicine

## 2013-03-15 LAB — HM MAMMOGRAPHY

## 2013-03-16 LAB — HM DIABETES EYE EXAM

## 2013-04-06 ENCOUNTER — Encounter: Payer: Self-pay | Admitting: Internal Medicine

## 2013-04-13 LAB — HM DIABETES EYE EXAM

## 2013-05-14 ENCOUNTER — Encounter: Payer: Self-pay | Admitting: Internal Medicine

## 2013-05-14 ENCOUNTER — Ambulatory Visit (INDEPENDENT_AMBULATORY_CARE_PROVIDER_SITE_OTHER): Payer: Medicare Other | Admitting: Internal Medicine

## 2013-05-14 VITALS — BP 124/80 | HR 66 | Temp 98.6°F | Wt 225.0 lb

## 2013-05-14 DIAGNOSIS — E119 Type 2 diabetes mellitus without complications: Secondary | ICD-10-CM

## 2013-05-14 DIAGNOSIS — Z23 Encounter for immunization: Secondary | ICD-10-CM

## 2013-05-14 DIAGNOSIS — I10 Essential (primary) hypertension: Secondary | ICD-10-CM

## 2013-05-14 LAB — COMPREHENSIVE METABOLIC PANEL
ALK PHOS: 64 U/L (ref 39–117)
ALT: 20 U/L (ref 0–35)
AST: 25 U/L (ref 0–37)
Albumin: 4 g/dL (ref 3.5–5.2)
BILIRUBIN TOTAL: 1.3 mg/dL — AB (ref 0.3–1.2)
BUN: 21 mg/dL (ref 6–23)
CO2: 25 mEq/L (ref 19–32)
Calcium: 9.7 mg/dL (ref 8.4–10.5)
Chloride: 107 mEq/L (ref 96–112)
Creatinine, Ser: 0.8 mg/dL (ref 0.4–1.2)
GFR: 74.8 mL/min (ref >60.00)
Glucose, Bld: 104 mg/dL — ABNORMAL HIGH (ref 70–99)
Potassium: 4 mEq/L (ref 3.5–5.1)
SODIUM: 139 meq/L (ref 135–145)
Total Protein: 6.6 g/dL (ref 6.0–8.3)

## 2013-05-14 LAB — HEMOGLOBIN A1C: Hgb A1c MFr Bld: 7.3 % — ABNORMAL HIGH (ref 4.6–6.5)

## 2013-05-14 LAB — MICROALBUMIN / CREATININE URINE RATIO
Creatinine,U: 139.8 mg/dL
Microalb Creat Ratio: 0.1 mg/g (ref 0.0–30.0)
Microalb, Ur: 0.2 mg/dL (ref 0.0–1.9)

## 2013-05-14 LAB — LIPID PANEL
CHOL/HDL RATIO: 3
Cholesterol: 116 mg/dL (ref 0–200)
HDL: 42.1 mg/dL (ref >39.00)
LDL Cholesterol: 59 mg/dL (ref 0–99)
Triglycerides: 76 mg/dL (ref 0.0–149.0)
VLDL: 15.2 mg/dL (ref 0.0–40.0)

## 2013-05-14 NOTE — Assessment & Plan Note (Signed)
BP Readings from Last 3 Encounters:  05/14/13 124/80  02/16/13 151/80  02/05/13 126/76   BP well controlled on Lisinopril-HCTZ. Will check renal function with labs.

## 2013-05-14 NOTE — Progress Notes (Signed)
Pre visit review using our clinic review tool, if applicable. No additional management support is needed unless otherwise documented below in the visit note. 

## 2013-05-14 NOTE — Progress Notes (Signed)
   Subjective:    Patient ID: Courtney Stone, female    DOB: 06-30-1940, 73 y.o.   MRN: [id number]  HPI 72YO female presents for follow up.  DM - BG well controlled 120-140. Compliant with Metformin.  Review of Systems  Constitutional: Negative for fever, chills, appetite change, fatigue and unexpected weight change.  HENT: Negative for congestion, ear pain, sinus pressure, sore throat, trouble swallowing and voice change.   Eyes: Negative for visual disturbance.  Respiratory: Negative for cough, shortness of breath, wheezing and stridor.   Cardiovascular: Negative for chest pain, palpitations and leg swelling.  Gastrointestinal: Negative for nausea, vomiting, abdominal pain, diarrhea, constipation, blood in stool, abdominal distention and anal bleeding.  Genitourinary: Negative for dysuria and flank pain.  Musculoskeletal: Negative for arthralgias, gait problem, myalgias and neck pain.  Skin: Negative for color change and rash.  Neurological: Negative for dizziness and headaches.  Hematological: Negative for adenopathy. Does not bruise/bleed easily.  Psychiatric/Behavioral: Negative for suicidal ideas, sleep disturbance and dysphoric mood. The patient is not nervous/anxious.        Objective:    BP 124/80  Pulse 66  Temp(Src) 98.6 F (37 C) (Oral)  Wt 225 lb (102.059 kg)  SpO2 97% Physical Exam  Constitutional: She is oriented to person, place, and time. She appears well-developed and well-nourished. No distress.  HENT:  Head: Normocephalic and atraumatic.  Right Ear: External ear normal.  Left Ear: External ear normal.  Nose: Nose normal.  Mouth/Throat: Oropharynx is clear and moist. No oropharyngeal exudate.  Eyes: Conjunctivae are normal. Pupils are equal, round, and reactive to light. Right eye exhibits no discharge. Left eye exhibits no discharge. No scleral icterus.  Neck: Normal range of motion. Neck supple. No tracheal deviation present. No thyromegaly present.    Cardiovascular: Normal rate, regular rhythm, normal heart sounds and intact distal pulses.  Exam reveals no gallop and no friction rub.   No murmur heard. Pulmonary/Chest: Effort normal and breath sounds normal. No accessory muscle usage. Not tachypneic. No respiratory distress. She has no decreased breath sounds. She has no wheezes. She has no rhonchi. She has no rales. She exhibits no tenderness.  Musculoskeletal: Normal range of motion. She exhibits no edema and no tenderness.  Lymphadenopathy:    She has no cervical adenopathy.  Neurological: She is alert and oriented to person, place, and time. No cranial nerve deficit. She exhibits normal muscle tone. Coordination normal.  Skin: Skin is warm and dry. No rash noted. She is not diaphoretic. No erythema. No pallor.  Psychiatric: She has a normal mood and affect. Her behavior is normal. Judgment and thought content normal.          Assessment & Plan:   Problem List Items Addressed This Visit   Diabetes mellitus type 2, controlled - Primary     BG well controlled. Will check A1c with labs today. Continue Metformin.    Relevant Orders      Comprehensive metabolic panel      Hemoglobin A1c      Lipid panel      Microalbumin / creatinine urine ratio   Hypertension      BP Readings from Last 3 Encounters:  05/14/13 124/80  02/16/13 151/80  02/05/13 126/76   BP well controlled on Lisinopril-HCTZ. Will check renal function with labs.        Return in about 3 months (around 08/13/2013) for Recheck of Diabetes.

## 2013-05-14 NOTE — Assessment & Plan Note (Signed)
BG well controlled. Will check A1c with labs today. Continue Metformin.

## 2013-05-14 NOTE — Addendum Note (Signed)
Addended by: Theola SequinLIVER, Cordera Stineman L on: 05/14/2013 09:02 AM   Modules accepted: Orders

## 2013-08-09 ENCOUNTER — Ambulatory Visit: Payer: Medicare Other | Admitting: Internal Medicine

## 2013-08-24 ENCOUNTER — Ambulatory Visit: Payer: Medicare Other | Admitting: Internal Medicine

## 2013-08-31 ENCOUNTER — Ambulatory Visit (INDEPENDENT_AMBULATORY_CARE_PROVIDER_SITE_OTHER): Payer: Medicare Other | Admitting: Internal Medicine

## 2013-08-31 ENCOUNTER — Encounter: Payer: Self-pay | Admitting: Internal Medicine

## 2013-08-31 VITALS — BP 138/72 | HR 67 | Temp 98.2°F | Ht 66.75 in | Wt 231.8 lb

## 2013-08-31 DIAGNOSIS — E669 Obesity, unspecified: Secondary | ICD-10-CM

## 2013-08-31 DIAGNOSIS — E119 Type 2 diabetes mellitus without complications: Secondary | ICD-10-CM

## 2013-08-31 DIAGNOSIS — J309 Allergic rhinitis, unspecified: Secondary | ICD-10-CM

## 2013-08-31 DIAGNOSIS — I10 Essential (primary) hypertension: Secondary | ICD-10-CM

## 2013-08-31 LAB — HM DIABETES FOOT EXAM: HM Diabetic Foot Exam: NORMAL

## 2013-08-31 MED ORDER — MOMETASONE FUROATE 50 MCG/ACT NA SUSP: 2.0000 | Freq: Every day | NASAL | Status: DC

## 2013-08-31 MED ORDER — METFORMIN HCL ER 500 MG PO TB24: 500.0000 mg | ORAL_TABLET | Freq: Every day | ORAL | Status: DC

## 2013-08-31 NOTE — Assessment & Plan Note (Signed)
Wt Readings from Last 3 Encounters:  08/31/13 231 lb 12 oz (105.121 kg)  05/14/13 225 lb (102.059 kg)  02/05/13 225 lb (102.059 kg)   Body mass index is 36.59 kg/(m^2). Encouraged healthy diet and regular exercise such as walking.

## 2013-08-31 NOTE — Assessment & Plan Note (Signed)
BP Readings from Last 3 Encounters:  08/31/13 138/72  05/14/13 124/80  02/16/13 151/80   BP well controlled on current medication. Will continue. Renal function with labs today.

## 2013-08-31 NOTE — Progress Notes (Signed)
Subjective:    Patient ID: Courtney Stone, female    DOB: [birth year], 73 y.o.   MRN: [id number]  HPI 72YO female presents for follow up.  DM - BG running near 140. Compliant with meds. Not following any diet or exercise program.  Review of Systems  Constitutional: Negative for fever, chills, appetite change, fatigue and unexpected weight change.  Eyes: Negative for visual disturbance.  Respiratory: Negative for shortness of breath.   Cardiovascular: Positive for leg swelling (left ankle, chronic). Negative for chest pain.  Gastrointestinal: Negative for abdominal pain.  Skin: Negative for color change and rash.  Hematological: Negative for adenopathy. Does not bruise/bleed easily.  Psychiatric/Behavioral: Negative for dysphoric mood. The patient is not nervous/anxious.        Objective:    BP 138/72  Pulse 67  Temp(Src) 98.2 F (36.8 C) (Oral)  Ht 5' 6.75" (1.695 m)  Wt 231 lb 12 oz (105.121 kg)  BMI 36.59 kg/m2  SpO2 95% Physical Exam  Constitutional: She is oriented to person, place, and time. She appears well-developed and well-nourished. No distress.  HENT:  Head: Normocephalic and atraumatic.  Right Ear: External ear normal.  Left Ear: External ear normal.  Nose: Nose normal.  Mouth/Throat: Oropharynx is clear and moist. No oropharyngeal exudate.  Eyes: Conjunctivae are normal. Pupils are equal, round, and reactive to light. Right eye exhibits no discharge. Left eye exhibits no discharge. No scleral icterus.  Neck: Normal range of motion. Neck supple. No tracheal deviation present. No thyromegaly present.  Cardiovascular: Normal rate, regular rhythm, normal heart sounds and intact distal pulses.  Exam reveals no gallop and no friction rub.   No murmur heard. Pulmonary/Chest: Effort normal and breath sounds normal. No accessory muscle usage. Not tachypneic. No respiratory distress. She has no decreased breath sounds. She has no wheezes. She has no rhonchi. She has no  rales. She exhibits no tenderness.  Musculoskeletal: Normal range of motion. She exhibits no edema and no tenderness.  Lymphadenopathy:    She has no cervical adenopathy.  Neurological: She is alert and oriented to person, place, and time. No cranial nerve deficit. She exhibits normal muscle tone. Coordination normal.  Skin: Skin is warm and dry. No rash noted. She is not diaphoretic. No erythema. No pallor.  Psychiatric: She has a normal mood and affect. Her behavior is normal. Judgment and thought content normal.  Foot exam including visual inspection and monofilament testing normal.        Assessment & Plan:   Problem List Items Addressed This Visit     Unprioritized   Diabetes mellitus type 2, controlled - Primary     Will check A1c with labs today. Continue Metformin. Encouraged healthy diet and exercise.    Relevant Medications      metFORMIN (GLUCOPHAGE-XR) 24 hr tablet   Other Relevant Orders      Comprehensive metabolic panel      Hemoglobin A1c      Lipid panel      Microalbumin / creatinine urine ratio   Hypertension      BP Readings from Last 3 Encounters:  08/31/13 138/72  05/14/13 124/80  02/16/13 151/80   BP well controlled on current medication. Will continue. Renal function with labs today.    Obesity (BMI 30-39.9)      Wt Readings from Last 3 Encounters:  08/31/13 231 lb 12 oz (105.121 kg)  05/14/13 225 lb (102.059 kg)  02/05/13 225 lb (102.059 kg)   Body mass  index is 36.59 kg/(m^2). Encouraged healthy diet and regular exercise such as walking.    Relevant Medications      metFORMIN (GLUCOPHAGE-XR) 24 hr tablet    Other Visit Diagnoses   Allergic rhinitis, unspecified allergic rhinitis type        Relevant Medications       mometasone (NASONEX) 50 MCG/ACT nasal spray        Return in about 3 months (around 12/01/2013).

## 2013-08-31 NOTE — Assessment & Plan Note (Signed)
Will check A1c with labs today. Continue Metformin. Encouraged healthy diet and exercise. 

## 2013-08-31 NOTE — Patient Instructions (Signed)
Labs today.   Follow up in 3 months.  

## 2013-08-31 NOTE — Progress Notes (Signed)
Pre visit review using our clinic review tool, if applicable. No additional management support is needed unless otherwise documented below in the visit note. 

## 2013-09-01 LAB — LIPID PANEL
CHOL/HDL RATIO: 3
Cholesterol: 130 mg/dL (ref 0–200)
HDL: 44.5 mg/dL (ref >39.00)
LDL Cholesterol: 59 mg/dL (ref 0–99)
NONHDL: 85.5
Triglycerides: 131 mg/dL (ref 0.0–149.0)
VLDL: 26.2 mg/dL (ref 0.0–40.0)

## 2013-09-01 LAB — COMPREHENSIVE METABOLIC PANEL
ALT: 28 U/L (ref 0–35)
AST: 30 U/L (ref 0–37)
Albumin: 4 g/dL (ref 3.5–5.2)
Alkaline Phosphatase: 72 U/L (ref 39–117)
BUN: 11 mg/dL (ref 6–23)
CALCIUM: 9.4 mg/dL (ref 8.4–10.5)
CHLORIDE: 107 meq/L (ref 96–112)
CO2: 26 meq/L (ref 19–32)
CREATININE: 0.8 mg/dL (ref 0.4–1.2)
GFR: 71.63 mL/min (ref >60.00)
GLUCOSE: 95 mg/dL (ref 70–99)
Potassium: 3.9 mEq/L (ref 3.5–5.1)
Sodium: 139 mEq/L (ref 135–145)
Total Bilirubin: 1 mg/dL (ref 0.2–1.2)
Total Protein: 6.5 g/dL (ref 6.0–8.3)

## 2013-09-01 LAB — MICROALBUMIN / CREATININE URINE RATIO
Creatinine,U: 18.1 mg/dL
MICROALB/CREAT RATIO: 2.2 mg/g (ref 0.0–30.0)
Microalb, Ur: 0.4 mg/dL (ref 0.0–1.9)

## 2013-09-01 LAB — HEMOGLOBIN A1C: Hgb A1c MFr Bld: 7.8 % — ABNORMAL HIGH (ref 4.6–6.5)

## 2013-10-16 ENCOUNTER — Ambulatory Visit (INDEPENDENT_AMBULATORY_CARE_PROVIDER_SITE_OTHER): Payer: Medicare Other

## 2013-10-16 DIAGNOSIS — Z23 Encounter for immunization: Secondary | ICD-10-CM

## 2013-12-06 ENCOUNTER — Ambulatory Visit (INDEPENDENT_AMBULATORY_CARE_PROVIDER_SITE_OTHER): Payer: Medicare Other | Admitting: Internal Medicine

## 2013-12-06 ENCOUNTER — Encounter: Payer: Self-pay | Admitting: Internal Medicine

## 2013-12-06 VITALS — BP 120/70 | HR 76 | Temp 98.2°F | Ht 66.75 in | Wt 231.2 lb

## 2013-12-06 DIAGNOSIS — I10 Essential (primary) hypertension: Secondary | ICD-10-CM

## 2013-12-06 DIAGNOSIS — E669 Obesity, unspecified: Secondary | ICD-10-CM

## 2013-12-06 DIAGNOSIS — M62838 Other muscle spasm: Secondary | ICD-10-CM

## 2013-12-06 DIAGNOSIS — M6248 Contracture of muscle, other site: Secondary | ICD-10-CM

## 2013-12-06 DIAGNOSIS — E119 Type 2 diabetes mellitus without complications: Secondary | ICD-10-CM

## 2013-12-06 LAB — COMPREHENSIVE METABOLIC PANEL
ALT: 22 U/L (ref 0–35)
AST: 24 U/L (ref 0–37)
Albumin: 4.2 g/dL (ref 3.5–5.2)
Alkaline Phosphatase: 70 U/L (ref 39–117)
BUN: 16 mg/dL (ref 6–23)
CO2: 22 mEq/L (ref 19–32)
CREATININE: 0.8 mg/dL (ref 0.4–1.2)
Calcium: 9.6 mg/dL (ref 8.4–10.5)
Chloride: 107 mEq/L (ref 96–112)
GFR: 71.58 mL/min (ref >60.00)
Glucose, Bld: 160 mg/dL — ABNORMAL HIGH (ref 70–99)
POTASSIUM: 4.3 meq/L (ref 3.5–5.1)
Sodium: 141 mEq/L (ref 135–145)
Total Bilirubin: 1.1 mg/dL (ref 0.2–1.2)
Total Protein: 6.7 g/dL (ref 6.0–8.3)

## 2013-12-06 LAB — HEMOGLOBIN A1C: Hgb A1c MFr Bld: 8.1 % — ABNORMAL HIGH (ref 4.6–6.5)

## 2013-12-06 LAB — MICROALBUMIN / CREATININE URINE RATIO
Creatinine,U: 114.5 mg/dL
MICROALB/CREAT RATIO: 0.2 mg/g (ref 0.0–30.0)
Microalb, Ur: 0.2 mg/dL (ref 0.0–1.9)

## 2013-12-06 LAB — LIPID PANEL
Cholesterol: 123 mg/dL (ref 0–200)
HDL: 35 mg/dL — ABNORMAL LOW (ref >39.00)
LDL Cholesterol: 54 mg/dL (ref 0–99)
NONHDL: 88
Total CHOL/HDL Ratio: 4
Triglycerides: 169 mg/dL — ABNORMAL HIGH (ref 0.0–149.0)
VLDL: 33.8 mg/dL (ref 0.0–40.0)

## 2013-12-06 MED ORDER — CYCLOBENZAPRINE HCL 5 MG PO TABS: 5.0000 mg | ORAL_TABLET | Freq: Three times a day (TID) | ORAL | Status: DC | PRN

## 2013-12-06 NOTE — Assessment & Plan Note (Signed)
Wt Readings from Last 3 Encounters:  12/06/13 231 lb 4 oz (104.894 kg)  08/31/13 231 lb 12 oz (105.121 kg)  05/14/13 225 lb (102.059 kg)   Encouraged healthy diet and exercise with goal of weight loss.

## 2013-12-06 NOTE — Assessment & Plan Note (Signed)
Will start Flexeril 5mg  po tid prn. If no improvement, would favor re-eval with [facility] given her history of cervical spine surgery in the past.

## 2013-12-06 NOTE — Progress Notes (Signed)
Subjective:    Patient ID: Courtney HumblesLynda B Rokosz, female    DOB: 28-Nov-1940, 73 y.o.   MRN: [id number]  HPI 73YO female presents for follow up.  DM - BG have been near 150s. Compliant with meds. Notes dietary indiscretion.  Stiff neck over last several weeks. No injury noted. First noticed driving.No numbness, tingling, weakness. Described as stiffness in lower neck and upper shoulders, which is made worse by rotation of the neck. No pain described. Started exercising after neck was hurting. No improvement. Had surgery on her neck 1993 with [facility] in [place]. Unsure exactly what surgery performed.    Review of Systems  Constitutional: Negative for fever, chills, appetite change, fatigue and unexpected weight change.  Eyes: Negative for visual disturbance.  Respiratory: Negative for shortness of breath.   Cardiovascular: Negative for chest pain and leg swelling.  Gastrointestinal: Negative for abdominal pain.  Musculoskeletal: Positive for myalgias, arthralgias and neck stiffness. Negative for neck pain.  Skin: Negative for color change and rash.  Hematological: Negative for adenopathy. Does not bruise/bleed easily.  Psychiatric/Behavioral: Negative for dysphoric mood. The patient is not nervous/anxious.        Objective:    BP 120/70 mmHg  Pulse 76  Temp(Src) 98.2 F (36.8 C) (Oral)  Ht 5' 6.75" (1.695 m)  Wt 231 lb 4 oz (104.894 kg)  BMI 36.51 kg/m2  SpO2 97% Physical Exam  Constitutional: She is oriented to person, place, and time. She appears well-developed and well-nourished. No distress.  HENT:  Head: Normocephalic and atraumatic.  Right Ear: External ear normal.  Left Ear: External ear normal.  Nose: Nose normal.  Mouth/Throat: Oropharynx is clear and moist. No oropharyngeal exudate.  Eyes: Conjunctivae are normal. Pupils are equal, round, and reactive to light. Right eye exhibits no discharge. Left eye exhibits no discharge. No scleral icterus.  Neck: Normal range of  motion. Neck supple. No spinous process tenderness and no muscular tenderness present. No rigidity. No tracheal deviation, no edema, no erythema and normal range of motion present. No thyromegaly present.    Cardiovascular: Normal rate, regular rhythm, normal heart sounds and intact distal pulses.  Exam reveals no gallop and no friction rub.   No murmur heard. Pulmonary/Chest: Effort normal and breath sounds normal. No accessory muscle usage. No tachypnea. No respiratory distress. She has no decreased breath sounds. She has no wheezes. She has no rhonchi. She has no rales. She exhibits no tenderness.  Musculoskeletal: Normal range of motion. She exhibits no edema or tenderness.  Lymphadenopathy:    She has no cervical adenopathy.  Neurological: She is alert and oriented to person, place, and time. No cranial nerve deficit. She exhibits normal muscle tone. Coordination normal.  Skin: Skin is warm and dry. No rash noted. She is not diaphoretic. No erythema. No pallor.  Psychiatric: She has a normal mood and affect. Her behavior is normal. Judgment and thought content normal.          Assessment & Plan:   Problem List Items Addressed This Visit      Unprioritized   Diabetes mellitus type 2, controlled - Primary    Will check A1c with labs today. Continue Metformin.    Relevant Orders      Comprehensive metabolic panel      Hemoglobin A1c      Lipid panel      Microalbumin / creatinine urine ratio   Hypertension    BP Readings from Last 3 Encounters:  12/06/13 120/70  08/31/13  138/72  05/14/13 124/80   BP well controlled on Lisinopril-HCTZ. Renal function with labs today.    Obesity (BMI 30-39.9)    Wt Readings from Last 3 Encounters:  12/06/13 231 lb 4 oz (104.894 kg)  08/31/13 231 lb 12 oz (105.121 kg)  05/14/13 225 lb (102.059 kg)   Encouraged healthy diet and exercise with goal of weight loss.    Trapezius muscle spasm    Will start Flexeril 5mg  po tid prn. If no  improvement, would favor re-eval with [facility] given her history of cervical spine surgery in the past.    Relevant Medications      cyclobenzaprine (FLEXERIL) tablet       Return in about 1 week (around 12/13/2013) for Recheck.

## 2013-12-06 NOTE — Assessment & Plan Note (Signed)
BP Readings from Last 3 Encounters:  12/06/13 120/70  08/31/13 138/72  05/14/13 124/80   BP well controlled on Lisinopril-HCTZ. Renal function with labs today.

## 2013-12-06 NOTE — Progress Notes (Signed)
Pre visit review using our clinic review tool, if applicable. No additional management support is needed unless otherwise documented below in the visit note. 

## 2013-12-06 NOTE — Assessment & Plan Note (Signed)
Will check A1c with labs today. Continue Metformin. 

## 2013-12-06 NOTE — Patient Instructions (Signed)
Labs today.  Start Flexeril 5mg  up to three times daily to help with muscle spasm in the neck.

## 2013-12-07 ENCOUNTER — Encounter: Payer: Self-pay | Admitting: *Deleted

## 2013-12-07 MED ORDER — GLIPIZIDE 2.5 MG HALF TABLET: ORAL_TABLET | ORAL | Status: DC

## 2013-12-07 NOTE — Telephone Encounter (Signed)
She is to continue Metformin, right?

## 2013-12-13 MED ORDER — GLIPIZIDE 2.5 MG HALF TABLET: ORAL_TABLET | ORAL | Status: DC

## 2013-12-13 NOTE — Addendum Note (Signed)
Addended by: Chandra BatchNIXON, Raequon Catanzaro E on: 12/13/2013 01:14 PM   Modules accepted: Orders

## 2013-12-15 ENCOUNTER — Telehealth: Payer: Self-pay | Admitting: *Deleted

## 2013-12-15 NOTE — Telephone Encounter (Signed)
Amber from the pharmacy called requesting clarification on the Glipizide 2.5mg , do you want Extended release as 2.5mg  only comes in Extended Release.  Please advise

## 2013-12-15 NOTE — Telephone Encounter (Signed)
[facility] advised of [facility] message

## 2013-12-15 NOTE — Telephone Encounter (Signed)
That is fine. Extended release is fine.

## 2014-01-21 HISTORY — PX: CATARACT EXTRACTION, BILATERAL: SHX1313

## 2014-03-14 ENCOUNTER — Other Ambulatory Visit: Payer: Self-pay | Admitting: Internal Medicine

## 2014-03-14 ENCOUNTER — Encounter: Payer: Self-pay | Admitting: Internal Medicine

## 2014-03-14 ENCOUNTER — Ambulatory Visit (INDEPENDENT_AMBULATORY_CARE_PROVIDER_SITE_OTHER): Payer: Medicare Other | Admitting: Internal Medicine

## 2014-03-14 VITALS — BP 131/78 | HR 68 | Temp 97.9°F | Ht 66.75 in | Wt 232.1 lb

## 2014-03-14 DIAGNOSIS — M62838 Other muscle spasm: Secondary | ICD-10-CM

## 2014-03-14 DIAGNOSIS — E119 Type 2 diabetes mellitus without complications: Secondary | ICD-10-CM

## 2014-03-14 DIAGNOSIS — I10 Essential (primary) hypertension: Secondary | ICD-10-CM

## 2014-03-14 DIAGNOSIS — M6248 Contracture of muscle, other site: Secondary | ICD-10-CM

## 2014-03-14 DIAGNOSIS — E669 Obesity, unspecified: Secondary | ICD-10-CM

## 2014-03-14 LAB — COMPREHENSIVE METABOLIC PANEL
ALT: 26 U/L (ref 0–35)
AST: 27 U/L (ref 0–37)
Albumin: 4 g/dL (ref 3.5–5.2)
Alkaline Phosphatase: 80 U/L (ref 39–117)
BUN: 16 mg/dL (ref 6–23)
CHLORIDE: 104 meq/L (ref 96–112)
CO2: 29 mEq/L (ref 19–32)
Calcium: 10.1 mg/dL (ref 8.4–10.5)
Creatinine, Ser: 0.81 mg/dL (ref 0.40–1.20)
GFR: 73.57 mL/min (ref >60.00)
Glucose, Bld: 137 mg/dL — ABNORMAL HIGH (ref 70–99)
POTASSIUM: 3.8 meq/L (ref 3.5–5.1)
SODIUM: 139 meq/L (ref 135–145)
TOTAL PROTEIN: 6.5 g/dL (ref 6.0–8.3)
Total Bilirubin: 0.8 mg/dL (ref 0.2–1.2)

## 2014-03-14 LAB — LIPID PANEL
CHOLESTEROL: 112 mg/dL (ref 0–200)
HDL: 48.3 mg/dL (ref >39.00)
LDL CALC: 46 mg/dL (ref 0–99)
NonHDL: 63.7
Total CHOL/HDL Ratio: 2
Triglycerides: 90 mg/dL (ref 0.0–149.0)
VLDL: 18 mg/dL (ref 0.0–40.0)

## 2014-03-14 LAB — HEMOGLOBIN A1C: Hgb A1c MFr Bld: 7.2 % — ABNORMAL HIGH (ref 4.6–6.5)

## 2014-03-14 NOTE — Assessment & Plan Note (Signed)
Wt Readings from Last 3 Encounters:  03/14/14 232 lb 2 oz (105.291 kg)  12/06/13 231 lb 4 oz (104.894 kg)  08/31/13 231 lb 12 oz (105.121 kg)   Body mass index is 36.65 kg/(m^2). The patient is asked to make an attempt to improve diet and exercise patterns to aid in medical management of this problem.

## 2014-03-14 NOTE — Assessment & Plan Note (Signed)
BP Readings from Last 3 Encounters:  03/14/14 131/78  12/06/13 120/70  08/31/13 138/72   BP well controlled. Will continue Lisinopril-HCTZ. Renal function with labs.

## 2014-03-14 NOTE — Progress Notes (Signed)
Pre visit review using our clinic review tool, if applicable. No additional management support is needed unless otherwise documented below in the visit note. 

## 2014-03-14 NOTE — Assessment & Plan Note (Addendum)
Neck pain improved with prn Tylenol or Flexeril. Will continue. If pain worsening, then we discussed referral back to [facility].

## 2014-03-14 NOTE — Progress Notes (Signed)
Subjective:    Patient ID: Courtney Stone, female    DOB: [birth year], 74 y.o.   MRN: [id number]  HPI  73YO female presents for follow up.  Last seen 11/2013 for follow up DM. A1c elevated 8.1% Started Glipizide 2.5mg  daily.  DM - BG mostly 120-130s. No low BG. Compliant with meds.  Neck pain - Initially used Flexeril with some improvement. Stopped using Flexeril and now has some episodes of stiffness and pain, which she notices when she wakes from sleep at night, about 2am. This improves with Tylenol. She is considering getting a new pillow to see if any improvement. Really no pain during the day.  Past medical, surgical, family and social history per today's encounter.  Review of Systems  Constitutional: Negative for fever, chills, appetite change, fatigue and unexpected weight change.  Eyes: Negative for visual disturbance.  Respiratory: Negative for shortness of breath.   Cardiovascular: Negative for chest pain and leg swelling.  Gastrointestinal: Negative for nausea, abdominal pain, diarrhea and constipation.  Musculoskeletal: Positive for myalgias, arthralgias, neck pain and neck stiffness.  Skin: Negative for color change and rash.  Hematological: Negative for adenopathy. Does not bruise/bleed easily.  Psychiatric/Behavioral: Negative for sleep disturbance and dysphoric mood. The patient is not nervous/anxious.        Objective:    BP 131/78 mmHg  Pulse 68  Temp(Src) 97.9 F (36.6 C) (Oral)  Ht 5' 6.75" (1.695 m)  Wt 232 lb 2 oz (105.291 kg)  BMI 36.65 kg/m2  SpO2 98% Physical Exam  Constitutional: She is oriented to person, place, and time. She appears well-developed and well-nourished. No distress.  HENT:  Head: Normocephalic and atraumatic.  Right Ear: External ear normal.  Left Ear: External ear normal.  Nose: Nose normal.  Mouth/Throat: Oropharynx is clear and moist. No oropharyngeal exudate.  Eyes: Conjunctivae are normal. Pupils are equal, round, and  reactive to light. Right eye exhibits no discharge. Left eye exhibits no discharge. No scleral icterus.  Neck: Normal range of motion. Neck supple. No tracheal deviation present. No thyromegaly present.  Cardiovascular: Normal rate, regular rhythm, normal heart sounds and intact distal pulses.  Exam reveals no gallop and no friction rub.   No murmur heard. Pulmonary/Chest: Effort normal and breath sounds normal. No respiratory distress. She has no wheezes. She has no rales. She exhibits no tenderness.  Musculoskeletal: Normal range of motion. She exhibits no edema or tenderness.  Lymphadenopathy:    She has no cervical adenopathy.  Neurological: She is alert and oriented to person, place, and time. No cranial nerve deficit. She exhibits normal muscle tone. Coordination normal.  Skin: Skin is warm and dry. No rash noted. She is not diaphoretic. No erythema. No pallor.  Psychiatric: She has a normal mood and affect. Her behavior is normal. Judgment and thought content normal.          Assessment & Plan:   Problem List Items Addressed This Visit      Unprioritized   Diabetes mellitus type 2, controlled - Primary    Will check A1c with labs. Foot exam normal today. Continue Metformin and Glipizide.      Relevant Orders   Comprehensive metabolic panel   Hemoglobin A1c   Lipid panel   Hypertension    BP Readings from Last 3 Encounters:  03/14/14 131/78  12/06/13 120/70  08/31/13 138/72   BP well controlled. Will continue Lisinopril-HCTZ. Renal function with labs.      Obesity (BMI 30-39.9)  Wt Readings from Last 3 Encounters:  03/14/14 232 lb 2 oz (105.291 kg)  12/06/13 231 lb 4 oz (104.894 kg)  08/31/13 231 lb 12 oz (105.121 kg)   Body mass index is 36.65 kg/(m^2). The patient is asked to make an attempt to improve diet and exercise patterns to aid in medical management of this problem.       Trapezius muscle spasm    Neck pain improved with prn Tylenol or Flexeril.  Will continue. If pain worsening, then we discussed referral back to [facility].          Return in about 3 months (around 06/12/2014) for Wellness Visit.

## 2014-03-14 NOTE — Assessment & Plan Note (Signed)
Will check A1c with labs. Foot exam normal today. Continue Metformin and Glipizide.

## 2014-03-14 NOTE — Patient Instructions (Signed)
Labs today

## 2014-04-20 ENCOUNTER — Other Ambulatory Visit: Payer: Self-pay | Admitting: Internal Medicine

## 2014-04-22 ENCOUNTER — Encounter: Payer: Self-pay | Admitting: Internal Medicine

## 2014-04-22 ENCOUNTER — Other Ambulatory Visit: Payer: Self-pay | Admitting: *Deleted

## 2014-05-19 ENCOUNTER — Encounter: Payer: Self-pay | Admitting: *Deleted

## 2014-05-19 LAB — HM DIABETES EYE EXAM

## 2014-06-01 ENCOUNTER — Other Ambulatory Visit: Payer: Self-pay | Admitting: *Deleted

## 2014-06-03 ENCOUNTER — Other Ambulatory Visit: Payer: Self-pay | Admitting: *Deleted

## 2014-06-03 ENCOUNTER — Other Ambulatory Visit: Payer: Self-pay

## 2014-06-03 MED ORDER — GLIPIZIDE ER 2.5 MG PO TB24: 2.5000 mg | ORAL_TABLET | Freq: Every day | ORAL | Status: DC

## 2014-06-03 MED ORDER — GLIPIZIDE 5 MG PO TABS: 2.5000 mg | ORAL_TABLET | Freq: Every day | ORAL | Status: DC

## 2014-06-13 ENCOUNTER — Ambulatory Visit (INDEPENDENT_AMBULATORY_CARE_PROVIDER_SITE_OTHER): Payer: Medicare Other | Admitting: Internal Medicine

## 2014-06-13 ENCOUNTER — Encounter: Payer: Self-pay | Admitting: Internal Medicine

## 2014-06-13 VITALS — BP 144/77 | HR 66 | Temp 97.7°F | Ht 66.5 in | Wt 236.0 lb

## 2014-06-13 DIAGNOSIS — E669 Obesity, unspecified: Secondary | ICD-10-CM | POA: Diagnosis not present

## 2014-06-13 DIAGNOSIS — I10 Essential (primary) hypertension: Secondary | ICD-10-CM

## 2014-06-13 DIAGNOSIS — Z0000 Encounter for general adult medical examination without abnormal findings: Secondary | ICD-10-CM

## 2014-06-13 DIAGNOSIS — E119 Type 2 diabetes mellitus without complications: Secondary | ICD-10-CM | POA: Diagnosis not present

## 2014-06-13 LAB — COMPREHENSIVE METABOLIC PANEL
ALK PHOS: 81 U/L (ref 39–117)
ALT: 25 U/L (ref 0–35)
AST: 22 U/L (ref 0–37)
Albumin: 4.1 g/dL (ref 3.5–5.2)
BUN: 17 mg/dL (ref 6–23)
CO2: 29 meq/L (ref 19–32)
CREATININE: 0.76 mg/dL (ref 0.40–1.20)
Calcium: 9.1 mg/dL (ref 8.4–10.5)
Chloride: 104 mEq/L (ref 96–112)
GFR: 79.13 mL/min (ref >60.00)
Glucose, Bld: 90 mg/dL (ref 70–99)
Potassium: 3.8 mEq/L (ref 3.5–5.1)
Sodium: 140 mEq/L (ref 135–145)
Total Bilirubin: 0.9 mg/dL (ref 0.2–1.2)
Total Protein: 6.3 g/dL (ref 6.0–8.3)

## 2014-06-13 LAB — CBC WITH DIFFERENTIAL/PLATELET
Basophils Absolute: 0 10*3/uL (ref 0.0–0.1)
Basophils Relative: 0.5 % (ref 0.0–3.0)
EOS PCT: 1.9 % (ref 0.0–5.0)
Eosinophils Absolute: 0.1 10*3/uL (ref 0.0–0.7)
HCT: 42.4 % (ref 36.0–46.0)
Hemoglobin: 14.4 g/dL (ref 12.0–15.0)
LYMPHS PCT: 30.7 % (ref 12.0–46.0)
Lymphs Abs: 2.3 10*3/uL (ref 0.7–4.0)
MCHC: 34 g/dL (ref 30.0–36.0)
MCV: 89.7 fl (ref 78.0–100.0)
Monocytes Absolute: 0.7 10*3/uL (ref 0.1–1.0)
Monocytes Relative: 9.8 % (ref 3.0–12.0)
NEUTROS PCT: 57.1 % (ref 43.0–77.0)
Neutro Abs: 4.3 10*3/uL (ref 1.4–7.7)
PLATELETS: 139 10*3/uL — AB (ref 150.0–400.0)
RBC: 4.73 Mil/uL (ref 3.87–5.11)
RDW: 13.9 % (ref 11.5–15.5)
WBC: 7.5 10*3/uL (ref 4.0–10.5)

## 2014-06-13 LAB — MICROALBUMIN / CREATININE URINE RATIO
CREATININE, U: 31.1 mg/dL
Microalb Creat Ratio: 2.3 mg/g (ref 0.0–30.0)

## 2014-06-13 LAB — LIPID PANEL
CHOLESTEROL: 125 mg/dL (ref 0–200)
HDL: 49.2 mg/dL (ref >39.00)
LDL Cholesterol: 57 mg/dL (ref 0–99)
NonHDL: 75.8
Total CHOL/HDL Ratio: 3
Triglycerides: 96 mg/dL (ref 0.0–149.0)
VLDL: 19.2 mg/dL (ref 0.0–40.0)

## 2014-06-13 LAB — HEMOGLOBIN A1C: HEMOGLOBIN A1C: 7.1 % — AB (ref 4.6–6.5)

## 2014-06-13 LAB — VITAMIN D 25 HYDROXY (VIT D DEFICIENCY, FRACTURES): VITD: 23.73 ng/mL — AB (ref 30.00–100.00)

## 2014-06-13 LAB — TSH: TSH: 0.42 u[IU]/mL (ref 0.35–4.50)

## 2014-06-13 LAB — HM DIABETES FOOT EXAM: HM DIABETIC FOOT EXAM: NORMAL

## 2014-06-13 NOTE — Assessment & Plan Note (Signed)
Encouraged healthy diet and exercise.  Wt Readings from Last 3 Encounters:  06/13/14 236 lb (107.049 kg)  03/14/14 232 lb 2 oz (105.291 kg)  12/06/13 231 lb 4 oz (104.894 kg)  .

## 2014-06-13 NOTE — Patient Instructions (Signed)

## 2014-06-13 NOTE — Assessment & Plan Note (Signed)
General medical exam including breast exam normal today. Pap and pelvic deferred given patient's age and history of normal Pap in the past. Mammogram 2/15 reviewed, plan repeat in 2/17. Colonoscopy is up-to-date. Encouraged healthy diet and regular physical activity. Immunizations are up to date.

## 2014-06-13 NOTE — Progress Notes (Signed)
Pre visit review using our clinic review tool, if applicable. No additional management support is needed unless otherwise documented below in the visit note. 

## 2014-06-13 NOTE — Assessment & Plan Note (Signed)
BP Readings from Last 3 Encounters:  06/13/14 144/77  03/14/14 131/78  12/06/13 120/70   BP slightly elevated today, but has generally been well controlled. Renal function with labs today.

## 2014-06-13 NOTE — Progress Notes (Signed)
Subjective:    Patient ID: Courtney Stone, female    DOB: May 04, 1940, 74 y.o.   MRN: [id number]  HPI  The patient is here for annual Medicare wellness examination and management of other chronic and acute problems.   The risk factors are reflected in the social history.  The roster of all physicians providing medical care to patient - is listed in the Snapshot section of the chart.  Activities of daily living:  The patient is 100% independent in all ADLs: dressing, toileting, feeding as well as independent mobility. Lives with husband and cat.  Home safety : The patient has smoke detectors in the home. They wear seatbelts.  There are no firearms at home. There is no violence in the home.   There is no risks for hepatitis, STDs or HIV. There is no history of blood transfusion. They have no travel history to infectious disease endemic areas of the world.  The patient has seen their dentist in the last six month Courtney Stone(Courtney Stone, dentistl).  They have seen their eye doctor in the last year (Dr. Julio AlmJim Stone).  Hearing testing in distant past, occasional trouble hearing whispered voice, followed by ENT. (Dr. Andee Stone)  They do not  have excessive sun exposure. Discussed the need for sun protection: hats, long sleeves and use of sunscreen if there is significant sun exposure. Dermatologist - none recently   Diet: the importance of a healthy diet is discussed. They do have a relatively healthy diet.  The benefits of regular aerobic exercise were discussed. No regular exercise. Walking occasionally.  Depression screen: there are no signs or vegative symptoms of depression- irritability, change in appetite, anhedonia, sadness/tearfullness.  Cognitive assessment: the patient manages all their financial and personal affairs and is actively engaged. They could relate day,date,year and events; performed clock-face test normally. Volunteers at school, teaching math.  HCPOA - husband, Courtney SchlichterCharles  Stone  The following portions of the patient's history were reviewed and updated as appropriate: allergies, current medications, past family history, past medical history,  past surgical history, past social history  and problem list.  Visual acuity was not assessed per patient preference since she has regular follow up with her ophthalmologist. Hearing and body mass index were assessed and reviewed.   During the course of the visit the patient was educated and counseled about appropriate screening and preventive services including : fall prevention , diabetes screening, nutrition counseling, colorectal cancer screening, and recommended immunizations.     Past medical, surgical, family and social history per today's encounter.  Review of Systems  Constitutional: Negative for fever, chills, appetite change, fatigue and unexpected weight change.  Eyes: Negative for visual disturbance.  Respiratory: Negative for shortness of breath.   Cardiovascular: Negative for chest pain and leg swelling.  Gastrointestinal: Negative for nausea, vomiting, abdominal pain, diarrhea and constipation.  Musculoskeletal: Negative for myalgias and arthralgias.  Skin: Negative for color change and rash.  Hematological: Negative for adenopathy. Does not bruise/bleed easily.  Psychiatric/Behavioral: Negative for suicidal ideas, sleep disturbance and dysphoric mood. The patient is not nervous/anxious.        Objective:    BP 144/77 mmHg  Pulse 66  Temp(Src) 97.7 F (36.5 C) (Oral)  Ht 5' 6.5" (1.689 m)  Wt 236 lb (107.049 kg)  BMI 37.53 kg/m2  SpO2 97% Physical Exam  Constitutional: She is oriented to person, place, and time. She appears well-developed and well-nourished. No distress.  HENT:  Head: Normocephalic and atraumatic.  Right Ear: External  ear normal.  Left Ear: External ear normal.  Nose: Nose normal.  Mouth/Throat: Oropharynx is clear and moist. No oropharyngeal exudate.  Eyes: Conjunctivae  are normal. Pupils are equal, round, and reactive to light. Right eye exhibits no discharge. Left eye exhibits no discharge. No scleral icterus.  Neck: Normal range of motion. Neck supple. No tracheal deviation present. No thyromegaly present.  Cardiovascular: Normal rate, regular rhythm, normal heart sounds and intact distal pulses.  Exam reveals no gallop and no friction rub.   No murmur heard. Pulmonary/Chest: Effort normal and breath sounds normal. No accessory muscle usage. No tachypnea. No respiratory distress. She has no decreased breath sounds. She has no wheezes. She has no rales. She exhibits no tenderness. Right breast exhibits no inverted nipple, no mass, no nipple discharge, no skin change and no tenderness. Left breast exhibits no inverted nipple, no mass, no nipple discharge, no skin change and no tenderness. Breasts are symmetrical.  Abdominal: Soft. Bowel sounds are normal. She exhibits no distension and no mass. There is no tenderness. There is no rebound and no guarding.  Musculoskeletal: Normal range of motion. She exhibits no edema or tenderness.  Lymphadenopathy:    She has no cervical adenopathy.  Neurological: She is alert and oriented to person, place, and time. No cranial nerve deficit. She exhibits normal muscle tone. Coordination normal.  Skin: Skin is warm and dry. No rash noted. She is not diaphoretic. No erythema. No pallor.  Psychiatric: She has a normal mood and affect. Her behavior is normal. Judgment and thought content normal.          Assessment & Plan:   Problem List Items Addressed This Visit      Unprioritized   Diabetes mellitus type 2, controlled    Will check A1c with labs. Continue Metformin and Glipizide.      Relevant Orders   TSH   Comprehensive metabolic panel   Hemoglobin A1c   Microalbumin / creatinine urine ratio   Hypertension    BP Readings from Last 3 Encounters:  06/13/14 144/77  03/14/14 131/78  12/06/13 120/70   BP  slightly elevated today, but has generally been well controlled. Renal function with labs today.      Medicare annual wellness visit, subsequent - Primary    General medical exam including breast exam normal today. Pap and pelvic deferred given patient's age and history of normal Pap in the past. Mammogram 2/15 reviewed, plan repeat in 2/17. Colonoscopy is up-to-date. Encouraged healthy diet and regular physical activity. Immunizations are up to date.       Relevant Orders   CBC with Differential/Platelet   Lipid panel   Vit D  25 hydroxy (rtn osteoporosis monitoring)   Obesity (BMI 30-39.9)    Encouraged healthy diet and exercise.  Wt Readings from Last 3 Encounters:  06/13/14 236 lb (107.049 kg)  03/14/14 232 lb 2 oz (105.291 kg)  12/06/13 231 lb 4 oz (104.894 kg)  .           Return in about 3 months (around 09/13/2014) for Recheck of Diabetes.

## 2014-06-13 NOTE — Assessment & Plan Note (Signed)
Will check A1c with labs. Continue Metformin and Glipizide. 

## 2014-06-21 ENCOUNTER — Other Ambulatory Visit: Payer: Self-pay | Admitting: *Deleted

## 2014-06-21 NOTE — Telephone Encounter (Signed)
error 

## 2014-07-04 ENCOUNTER — Other Ambulatory Visit: Payer: Self-pay | Admitting: *Deleted

## 2014-07-04 NOTE — Telephone Encounter (Signed)
Fax from pharmacy, refill for Atorvastatin not on current med list. Sent mychart msg to pt

## 2014-07-05 NOTE — Telephone Encounter (Signed)
Pt states she is not taking Atorvastatin. Pharmacy notified.

## 2014-09-13 ENCOUNTER — Ambulatory Visit (INDEPENDENT_AMBULATORY_CARE_PROVIDER_SITE_OTHER): Payer: Medicare Other | Admitting: Internal Medicine

## 2014-09-13 ENCOUNTER — Encounter: Payer: Self-pay | Admitting: Internal Medicine

## 2014-09-13 VITALS — BP 126/80 | HR 82 | Temp 97.8°F | Ht 66.5 in | Wt 236.0 lb

## 2014-09-13 DIAGNOSIS — R5382 Chronic fatigue, unspecified: Secondary | ICD-10-CM

## 2014-09-13 DIAGNOSIS — I10 Essential (primary) hypertension: Secondary | ICD-10-CM | POA: Diagnosis not present

## 2014-09-13 DIAGNOSIS — Z79899 Other long term (current) drug therapy: Secondary | ICD-10-CM

## 2014-09-13 DIAGNOSIS — E119 Type 2 diabetes mellitus without complications: Secondary | ICD-10-CM

## 2014-09-13 DIAGNOSIS — M25512 Pain in left shoulder: Secondary | ICD-10-CM | POA: Insufficient documentation

## 2014-09-13 LAB — CBC WITH DIFFERENTIAL/PLATELET
BASOS PCT: 0.1 % (ref 0.0–3.0)
Basophils Absolute: 0 10*3/uL (ref 0.0–0.1)
EOS PCT: 0.8 % (ref 0.0–5.0)
Eosinophils Absolute: 0.1 10*3/uL (ref 0.0–0.7)
HEMATOCRIT: 44.2 % (ref 36.0–46.0)
Hemoglobin: 14.8 g/dL (ref 12.0–15.0)
LYMPHS PCT: 15.7 % (ref 12.0–46.0)
Lymphs Abs: 1.7 10*3/uL (ref 0.7–4.0)
MCHC: 33.5 g/dL (ref 30.0–36.0)
MCV: 91.2 fl (ref 78.0–100.0)
MONOS PCT: 8.5 % (ref 3.0–12.0)
Monocytes Absolute: 0.9 10*3/uL (ref 0.1–1.0)
NEUTROS ABS: 8.1 10*3/uL — AB (ref 1.4–7.7)
Neutrophils Relative %: 74.9 % (ref 43.0–77.0)
PLATELETS: 143 10*3/uL — AB (ref 150.0–400.0)
RBC: 4.85 Mil/uL (ref 3.87–5.11)
RDW: 14 % (ref 11.5–15.5)
WBC: 10.8 10*3/uL — ABNORMAL HIGH (ref 4.0–10.5)

## 2014-09-13 LAB — COMPREHENSIVE METABOLIC PANEL
ALBUMIN: 3.9 g/dL (ref 3.5–5.2)
ALK PHOS: 72 U/L (ref 39–117)
ALT: 28 U/L (ref 0–35)
AST: 25 U/L (ref 0–37)
BUN: 19 mg/dL (ref 6–23)
CALCIUM: 9.2 mg/dL (ref 8.4–10.5)
CHLORIDE: 104 meq/L (ref 96–112)
CO2: 28 mEq/L (ref 19–32)
Creatinine, Ser: 0.82 mg/dL (ref 0.40–1.20)
GFR: 72.43 mL/min (ref >60.00)
Glucose, Bld: 134 mg/dL — ABNORMAL HIGH (ref 70–99)
Potassium: 4 mEq/L (ref 3.5–5.1)
Sodium: 139 mEq/L (ref 135–145)
TOTAL PROTEIN: 6.4 g/dL (ref 6.0–8.3)
Total Bilirubin: 1.2 mg/dL (ref 0.2–1.2)

## 2014-09-13 LAB — HEMOGLOBIN A1C: HEMOGLOBIN A1C: 7.2 % — AB (ref 4.6–6.5)

## 2014-09-13 LAB — TSH: TSH: 0.32 u[IU]/mL — ABNORMAL LOW (ref 0.35–4.50)

## 2014-09-13 LAB — VITAMIN B12: Vitamin B-12: 324 pg/mL (ref 211–911)

## 2014-09-13 MED ORDER — GLIPIZIDE ER 2.5 MG PO TB24: 2.5000 mg | ORAL_TABLET | Freq: Every day | ORAL | Status: DC

## 2014-09-13 MED ORDER — METFORMIN HCL ER 500 MG PO TB24: 500.0000 mg | ORAL_TABLET | Freq: Every day | ORAL | Status: DC

## 2014-09-13 MED ORDER — CYCLOBENZAPRINE HCL 5 MG PO TABS: 5.0000 mg | ORAL_TABLET | Freq: Three times a day (TID) | ORAL | Status: DC | PRN

## 2014-09-13 NOTE — Patient Instructions (Addendum)
Labs today.  Please ice your back and shoulder for 20-83min several times daily. Continue Tylenol and start back on Cyclobenzaprine up to three times daily as needed for pain.  Follow up in 2 weeks for recheck.

## 2014-09-13 NOTE — Assessment & Plan Note (Addendum)
Recent increasing fatigue. No focal symptoms. Exam normal except as noted. EKG today unchanged from previous. Will check labs including CBC, TSH, B12, CMP. Discussed risks for CAD and possibility of CAD as underlying cause of fatigue. Encouraged her to consider stress test. She declines for now.

## 2014-09-13 NOTE — Progress Notes (Signed)
Subjective:    Patient ID: Courtney Stone, female    DOB: July 21, 1940, 74 y.o.   MRN: [id number]  HPI  74YO female presents for follow up.  Left posterior shoulder pain - Started 3 weeks ago. Sharp at times, becoming more dull. No new activity recently, however some prolonged time on computer. Pain worsened by extending arm forward. No dyspnea, cough. No fever, chills. Feels more tired. No chest pain. No urinary symptoms. No weakness or numbness. Taking Tylenol at night with minimal improvement.  DM - BG have been slightly higher in 140s. Compliant with medication.  Fatigue - Recently feeling more fatigued. No focal symptoms. Some chronic dyspnea with exertion. No CP. No change in bowel habits or appetite.   Wt Readings from Last 3 Encounters:  09/13/14 236 lb (107.049 kg)  06/13/14 236 lb (107.049 kg)  03/14/14 232 lb 2 oz (105.291 kg)      Past Medical History  Diagnosis Date  . HTN (hypertension)     120/80's at home  . Diabetes mellitus    Family History  Problem Relation Age of Onset  . Cancer Mother     lung  . Cancer Father   . COPD Father   . Diabetes Sister   . Hypertension Sister    Past Surgical History  Procedure Laterality Date  . Abdominal hysterectomy  1976    complete  . Breast surgery  1968    benign  . Urethra surgery    . Neck surgery  1993    ?bone spur removal  . Wrist fracture surgery     Social History   Social History  . Marital Status: Married    Spouse Name: N/A  . Number of Children: N/A  . Years of Education: N/A   Social History Main Topics  . Smoking status: Never Smoker   . Smokeless tobacco: Never Used  . Alcohol Use: No  . Drug Use: No  . Sexual Activity: Not Asked   Other Topics Concern  . None   Social History Narrative   Lives in [place].   Work - Runner, broadcasting/film/video    Review of Systems  Constitutional: Negative for fever, chills, appetite change, fatigue and unexpected weight change.  Eyes: Negative for visual  disturbance.  Respiratory: Negative for shortness of breath.   Cardiovascular: Negative for chest pain and leg swelling.  Gastrointestinal: Negative for abdominal pain.  Musculoskeletal: Positive for myalgias, back pain and arthralgias. Negative for neck pain and neck stiffness.  Skin: Negative for color change and rash.  Neurological: Negative for weakness and numbness.  Hematological: Negative for adenopathy. Does not bruise/bleed easily.  Psychiatric/Behavioral: Negative for sleep disturbance and dysphoric mood. The patient is not nervous/anxious.        Objective:    BP 126/80 mmHg  Pulse 82  Temp(Src) 97.8 F (36.6 C) (Oral)  Ht 5' 6.5" (1.689 m)  Wt 236 lb (107.049 kg)  BMI 37.53 kg/m2  SpO2 97% Physical Exam  Constitutional: She is oriented to person, place, and time. She appears well-developed and well-nourished. No distress.  HENT:  Head: Normocephalic and atraumatic.  Right Ear: External ear normal.  Left Ear: External ear normal.  Nose: Nose normal.  Mouth/Throat: Oropharynx is clear and moist. No oropharyngeal exudate.  Eyes: Conjunctivae are normal. Pupils are equal, round, and reactive to light. Right eye exhibits no discharge. Left eye exhibits no discharge. No scleral icterus.  Neck: Normal range of motion. Neck supple. No tracheal deviation present. No  thyromegaly present.  Cardiovascular: Normal rate, regular rhythm, normal heart sounds and intact distal pulses.  Exam reveals no gallop and no friction rub.   No murmur heard. Pulmonary/Chest: Effort normal and breath sounds normal. No respiratory distress. She has no wheezes. She has no rales. She exhibits no tenderness.  Musculoskeletal: Normal range of motion. She exhibits no edema or tenderness.       Back:  Lymphadenopathy:    She has no cervical adenopathy.  Neurological: She is alert and oriented to person, place, and time. No cranial nerve deficit. She exhibits normal muscle tone. Coordination normal.    Skin: Skin is warm and dry. No rash noted. She is not diaphoretic. No erythema. No pallor.  Psychiatric: She has a normal mood and affect. Her behavior is normal. Judgment and thought content normal.          Assessment & Plan:   Problem List Items Addressed This Visit      Unprioritized   Chronic fatigue    Recent increasing fatigue. No focal symptoms. Exam normal except as noted. EKG today unchanged from previous. Will check labs including CBC, TSH, B12, CMP. Discussed risks for CAD and possibility of CAD as underlying cause of fatigue. Encouraged her to consider stress test. She declines for now.      Relevant Orders   TSH   CBC with Differential/Platelet   B12   Diabetes mellitus type 2, controlled    BG well controlled by report. Will check A1c with labs. Continue Glipizide and Metformin.      Relevant Medications   glipiZIDE (GLUCOTROL XL) 2.5 MG 24 hr tablet   metFORMIN (GLUCOPHAGE-XR) 500 MG 24 hr tablet   Other Relevant Orders   Comprehensive metabolic panel   Hemoglobin A1c   Hypertension    BP Readings from Last 3 Encounters:  09/13/14 126/80  06/13/14 144/77  03/14/14 131/78   BP well controlled. Continue current medications. Renal function with labs.      Left shoulder pain - Primary    Left shoulder pain most c/w strain of the latissimus. Will start icing area, continue Tylenol and add prn Flexeril. Follow up in 2 weeks for recheck.      Relevant Orders   EKG 12-Lead (Completed)       Return in about 2 weeks (around 09/27/2014) for Recheck.

## 2014-09-13 NOTE — Assessment & Plan Note (Addendum)
BG well controlled by report. Will check A1c with labs. Continue Glipizide and Metformin.

## 2014-09-13 NOTE — Assessment & Plan Note (Signed)
Left shoulder pain most c/w strain of the latissimus. Will start icing area, continue Tylenol and add prn Flexeril. Follow up in 2 weeks for recheck.

## 2014-09-13 NOTE — Assessment & Plan Note (Signed)
BP Readings from Last 3 Encounters:  09/13/14 126/80  06/13/14 144/77  03/14/14 131/78   BP well controlled. Continue current medications. Renal function with labs.

## 2014-09-13 NOTE — Progress Notes (Signed)
Pre visit review using our clinic review tool, if applicable. No additional management support is needed unless otherwise documented below in the visit note. 

## 2014-09-28 ENCOUNTER — Ambulatory Visit (INDEPENDENT_AMBULATORY_CARE_PROVIDER_SITE_OTHER): Payer: Medicare Other | Admitting: Internal Medicine

## 2014-09-28 ENCOUNTER — Encounter: Payer: Self-pay | Admitting: Internal Medicine

## 2014-09-28 VITALS — BP 135/77 | HR 70 | Temp 98.0°F | Wt 237.0 lb

## 2014-09-28 DIAGNOSIS — R0683 Snoring: Secondary | ICD-10-CM

## 2014-09-28 DIAGNOSIS — D72829 Elevated white blood cell count, unspecified: Secondary | ICD-10-CM | POA: Diagnosis not present

## 2014-09-28 DIAGNOSIS — R5382 Chronic fatigue, unspecified: Secondary | ICD-10-CM | POA: Diagnosis not present

## 2014-09-28 LAB — CBC WITH DIFFERENTIAL/PLATELET
BASOS ABS: 0 10*3/uL (ref 0.0–0.1)
BASOS PCT: 0.4 % (ref 0.0–3.0)
EOS ABS: 0.1 10*3/uL (ref 0.0–0.7)
Eosinophils Relative: 1.7 % (ref 0.0–5.0)
HEMATOCRIT: 43.6 % (ref 36.0–46.0)
HEMOGLOBIN: 14.5 g/dL (ref 12.0–15.0)
LYMPHS PCT: 29.3 % (ref 12.0–46.0)
Lymphs Abs: 2.4 10*3/uL (ref 0.7–4.0)
MCHC: 33.3 g/dL (ref 30.0–36.0)
MCV: 91.3 fl (ref 78.0–100.0)
MONO ABS: 0.8 10*3/uL (ref 0.1–1.0)
Monocytes Relative: 9.7 % (ref 3.0–12.0)
Neutro Abs: 4.9 10*3/uL (ref 1.4–7.7)
Neutrophils Relative %: 58.9 % (ref 43.0–77.0)
Platelets: 142 10*3/uL — ABNORMAL LOW (ref 150.0–400.0)
RBC: 4.78 Mil/uL (ref 3.87–5.11)
RDW: 13.8 % (ref 11.5–15.5)
WBC: 8.3 10*3/uL (ref 4.0–10.5)

## 2014-09-28 NOTE — Patient Instructions (Signed)
We will set up a sleep study.  Labs today.

## 2014-09-28 NOTE — Progress Notes (Signed)
Pre visit review using our clinic review tool, if applicable. No additional management support is needed unless otherwise documented below in the visit note. 

## 2014-09-28 NOTE — Assessment & Plan Note (Signed)
Chronic fatigue. Labs showed no abnormalities to explain fatigue. Suspect OSA. Will set up sleep study for evaluation.

## 2014-09-28 NOTE — Progress Notes (Signed)
Subjective:    Patient ID: Courtney Stone, female    DOB: 04/27/1940, 74 y.o.   MRN: [id number]  HPI  74YO female presents for follow up.  Last seen 8/23 for fatigue and left shoulder pain. Shoulder pain has resolved.  Continues to have some fatigue. No focal symptoms of dyspnea, chest pain. Able to perform ADLs. Notes that she snores at night. Wakes about 5:30am but back to sleep by 8am because of fatigue.  Past Medical History  Diagnosis Date  . HTN (hypertension)     120/80's at home  . Diabetes mellitus    Family History  Problem Relation Age of Onset  . Cancer Mother     lung  . Cancer Father   . COPD Father   . Diabetes Sister   . Hypertension Sister    Past Surgical History  Procedure Laterality Date  . Abdominal hysterectomy  1976    complete  . Breast surgery  1968    benign  . Urethra surgery    . Neck surgery  1993    ?bone spur removal  . Wrist fracture surgery     Social History   Social History  . Marital Status: Married    Spouse Name: N/A  . Number of Children: N/A  . Years of Education: N/A   Social History Main Topics  . Smoking status: Never Smoker   . Smokeless tobacco: Never Used  . Alcohol Use: No  . Drug Use: No  . Sexual Activity: Not Asked   Other Topics Concern  . None   Social History Narrative   Lives in [place].   Work - Runner, broadcasting/film/video    Review of Systems  Constitutional: Positive for fatigue. Negative for fever, chills, appetite change and unexpected weight change.  Eyes: Negative for visual disturbance.  Respiratory: Negative for shortness of breath.   Cardiovascular: Negative for chest pain and leg swelling.  Gastrointestinal: Negative for nausea, vomiting, abdominal pain, diarrhea and constipation.  Skin: Negative for color change and rash.  Hematological: Negative for adenopathy. Does not bruise/bleed easily.  Psychiatric/Behavioral: Positive for sleep disturbance. Negative for dysphoric mood. The patient is not  nervous/anxious.        Objective:    BP 135/77 mmHg  Pulse 70  Temp(Src) 98 F (36.7 C) (Oral)  Wt 237 lb (107.502 kg)  SpO2 95% Physical Exam  Constitutional: She is oriented to person, place, and time. She appears well-developed and well-nourished. No distress.  HENT:  Head: Normocephalic and atraumatic.  Right Ear: External ear normal.  Left Ear: External ear normal.  Nose: Nose normal.  Mouth/Throat: Oropharynx is clear and moist. No oropharyngeal exudate.  Eyes: Conjunctivae are normal. Pupils are equal, round, and reactive to light. Right eye exhibits no discharge. Left eye exhibits no discharge. No scleral icterus.  Neck: Normal range of motion. Neck supple. No tracheal deviation present. No thyromegaly present.  Cardiovascular: Normal rate, regular rhythm, normal heart sounds and intact distal pulses.  Exam reveals no gallop and no friction rub.   No murmur heard. Pulmonary/Chest: Effort normal and breath sounds normal. No respiratory distress. She has no wheezes. She has no rales. She exhibits no tenderness.  Musculoskeletal: Normal range of motion. She exhibits no edema or tenderness.  Lymphadenopathy:    She has no cervical adenopathy.  Neurological: She is alert and oriented to person, place, and time. No cranial nerve deficit. She exhibits normal muscle tone. Coordination normal.  Skin: Skin is warm and dry.  No rash noted. She is not diaphoretic. No erythema. No pallor.  Psychiatric: She has a normal mood and affect. Her behavior is normal. Judgment and thought content normal.          Assessment & Plan:   Problem List Items Addressed This Visit      Unprioritized   Chronic fatigue - Primary    Chronic fatigue. Labs showed no abnormalities to explain fatigue. Suspect OSA. Will set up sleep study for evaluation.      Relevant Orders   Ambulatory referral to Sleep Studies   Leukocytosis    Mild leukocytosis noted on recent labs. Will repeat CBC today.       Relevant Orders   CBC w/Diff   Snoring    Habitual snoring and daytime fatigue. Will set up sleep study to evaluate for OSA.      Relevant Orders   Ambulatory referral to Sleep Studies       Return in about 4 weeks (around 10/26/2014) for Recheck.

## 2014-09-28 NOTE — Assessment & Plan Note (Signed)
Habitual snoring and daytime fatigue. Will set up sleep study to evaluate for OSA.

## 2014-09-28 NOTE — Assessment & Plan Note (Signed)
Mild leukocytosis noted on recent labs. Will repeat CBC today.

## 2014-10-06 ENCOUNTER — Telehealth: Payer: Self-pay | Admitting: Internal Medicine

## 2014-10-06 NOTE — Telephone Encounter (Signed)
Sleep study from [facility] showed moderate obstructive sleep apnea. We should place an order to set up auto-titrating CPAP. She may also want to come for a visit to discuss.

## 2014-10-07 NOTE — Telephone Encounter (Signed)
Pt states she will discuss at her next office visit

## 2014-10-26 ENCOUNTER — Ambulatory Visit (INDEPENDENT_AMBULATORY_CARE_PROVIDER_SITE_OTHER): Payer: Medicare Other | Admitting: Internal Medicine

## 2014-10-26 ENCOUNTER — Encounter: Payer: Self-pay | Admitting: Internal Medicine

## 2014-10-26 VITALS — BP 123/78 | HR 75 | Temp 97.6°F | Ht 66.5 in | Wt 236.1 lb

## 2014-10-26 DIAGNOSIS — E119 Type 2 diabetes mellitus without complications: Secondary | ICD-10-CM | POA: Diagnosis not present

## 2014-10-26 DIAGNOSIS — Z23 Encounter for immunization: Secondary | ICD-10-CM

## 2014-10-26 DIAGNOSIS — R5382 Chronic fatigue, unspecified: Secondary | ICD-10-CM | POA: Diagnosis not present

## 2014-10-26 DIAGNOSIS — G4733 Obstructive sleep apnea (adult) (pediatric): Secondary | ICD-10-CM | POA: Diagnosis not present

## 2014-10-26 DIAGNOSIS — I10 Essential (primary) hypertension: Secondary | ICD-10-CM | POA: Diagnosis not present

## 2014-10-26 NOTE — Progress Notes (Signed)
Subjective:    Patient ID: Courtney Stone, female    DOB: 03/12/1940, 74 y.o.   MRN: [id number]  HPI  74YO female presents for follow up.  Last seen 9/7 for fatigue. Had sleep study which showed moderate sleep apnea. Continues to have some mild fatigue. Notes that sleep is disrupted by both muscle cramps in legs, urination and snoring.   Wt Readings from Last 3 Encounters:  10/26/14 236 lb 2 oz (107.106 kg)  09/28/14 237 lb (107.502 kg)  09/13/14 236 lb (107.049 kg)   BP Readings from Last 3 Encounters:  10/26/14 123/78  09/28/14 135/77  09/13/14 126/80    Past Medical History  Diagnosis Date  . HTN (hypertension)     120/80's at home  . Diabetes mellitus    Family History  Problem Relation Age of Onset  . Cancer Mother     lung  . Cancer Father   . COPD Father   . Diabetes Sister   . Hypertension Sister    Past Surgical History  Procedure Laterality Date  . Abdominal hysterectomy  1976    complete  . Breast surgery  1968    benign  . Urethra surgery    . Neck surgery  1993    ?bone spur removal  . Wrist fracture surgery     Social History   Social History  . Marital Status: Married    Spouse Name: N/A  . Number of Children: N/A  . Years of Education: N/A   Social History Main Topics  . Smoking status: Never Smoker   . Smokeless tobacco: Never Used  . Alcohol Use: No  . Drug Use: No  . Sexual Activity: Not Asked   Other Topics Concern  . None   Social History Narrative   Lives in [place].   Work - Runner, broadcasting/film/video    Review of Systems  Constitutional: Positive for fatigue. Negative for fever, chills, appetite change and unexpected weight change.  Eyes: Negative for visual disturbance.  Respiratory: Negative for shortness of breath.   Cardiovascular: Negative for chest pain and leg swelling.  Gastrointestinal: Negative for nausea, vomiting, abdominal pain, diarrhea and constipation.  Musculoskeletal: Positive for myalgias. Negative for  arthralgias.  Skin: Negative for color change and rash.  Hematological: Negative for adenopathy. Does not bruise/bleed easily.  Psychiatric/Behavioral: Negative for dysphoric mood. The patient is not nervous/anxious.        Objective:    BP 123/78 mmHg  Pulse 75  Temp(Src) 97.6 F (36.4 C) (Oral)  Ht 5' 6.5" (1.689 m)  Wt 236 lb 2 oz (107.106 kg)  BMI 37.55 kg/m2  SpO2 97% Physical Exam  Constitutional: She is oriented to person, place, and time. She appears well-developed and well-nourished. No distress.  HENT:  Head: Normocephalic and atraumatic.  Right Ear: External ear normal.  Left Ear: External ear normal.  Nose: Nose normal.  Mouth/Throat: Oropharynx is clear and moist. No oropharyngeal exudate.  Eyes: Conjunctivae are normal. Pupils are equal, round, and reactive to light. Right eye exhibits no discharge. Left eye exhibits no discharge. No scleral icterus.  Neck: Normal range of motion. Neck supple. No tracheal deviation present. No thyromegaly present.  Cardiovascular: Normal rate, regular rhythm, normal heart sounds and intact distal pulses.  Exam reveals no gallop and no friction rub.   No murmur heard. Pulmonary/Chest: Effort normal and breath sounds normal. No respiratory distress. She has no wheezes. She has no rales. She exhibits no tenderness.  Musculoskeletal: Normal range  of motion. She exhibits no edema or tenderness.  Lymphadenopathy:    She has no cervical adenopathy.  Neurological: She is alert and oriented to person, place, and time. No cranial nerve deficit. She exhibits normal muscle tone. Coordination normal.  Skin: Skin is warm and dry. No rash noted. She is not diaphoretic. No erythema. No pallor.  Psychiatric: She has a normal mood and affect. Her behavior is normal. Judgment and thought content normal.          Assessment & Plan:   Problem List Items Addressed This Visit      Unprioritized   Chronic fatigue    Chronic fatigue. Likely in  part 2/2 OSA. Start Auto-titrating CPAP. Follow up in 4 weeks and prn.      Diabetes mellitus type 2, controlled (HCC)    BG recently well controlled. Repeat A1c in 11/2014. Continue Metformin and Glipizide.      Hypertension    BP Readings from Last 3 Encounters:  10/26/14 123/78  09/28/14 135/77  09/13/14 126/80   BP well controlled. Continue current medications. Follow up in 4 weeks.      Obstructive sleep apnea - Primary    Reviewed recent sleep study which showed moderate sleep apnea. Will send in Rx for Auto titrating CPAP. Follow up in 4 weeks and prn.          Return in about 8 weeks (around 12/21/2014) for Recheck.

## 2014-10-26 NOTE — Progress Notes (Signed)
Pre visit review using our clinic review tool, if applicable. No additional management support is needed unless otherwise documented below in the visit note. 

## 2014-10-26 NOTE — Assessment & Plan Note (Signed)
BG recently well controlled. Repeat A1c in 11/2014. Continue Metformin and Glipizide.

## 2014-10-26 NOTE — Assessment & Plan Note (Signed)
BP Readings from Last 3 Encounters:  10/26/14 123/78  09/28/14 135/77  09/13/14 126/80   BP well controlled. Continue current medications. Follow up in 4 weeks.

## 2014-10-26 NOTE — Assessment & Plan Note (Signed)
Reviewed recent sleep study which showed moderate sleep apnea. Will send in Rx for Auto titrating CPAP. Follow up in 4 weeks and prn.

## 2014-10-26 NOTE — Assessment & Plan Note (Signed)
Chronic fatigue. Likely in part 2/2 OSA. Start Auto-titrating CPAP. Follow up in 4 weeks and prn.

## 2014-10-26 NOTE — Patient Instructions (Signed)
We will set up CPAP at home.  Follow up in 8 weeks.  Sleep Apnea  Sleep apnea is a sleep disorder characterized by abnormal pauses in breathing while you sleep. When your breathing pauses, the level of oxygen in your blood decreases. This causes you to move out of deep sleep and into light sleep. As a result, your quality of sleep is poor, and the system that carries your blood throughout your body (cardiovascular system) experiences stress. If sleep apnea remains untreated, the following conditions can develop:  High blood pressure (hypertension).  Coronary artery disease.  Inability to achieve or maintain an erection (impotence).  Impairment of your thought process (cognitive dysfunction). There are three types of sleep apnea: 1. Obstructive sleep apnea--Pauses in breathing during sleep because of a blocked airway. 2. Central sleep apnea--Pauses in breathing during sleep because the area of the brain that controls your breathing does not send the correct signals to the muscles that control breathing. 3. Mixed sleep apnea--A combination of both obstructive and central sleep apnea. RISK FACTORS The following risk factors can increase your risk of developing sleep apnea:  Being overweight.  Smoking.  Having narrow passages in your nose and throat.  Being of older age.  Being female.  Alcohol use.  Sedative and tranquilizer use.  Ethnicity. Among individuals younger than 35 years, African Americans are at increased risk of sleep apnea. SYMPTOMS   Difficulty staying asleep.  Daytime sleepiness and fatigue.  Loss of energy.  Irritability.  Loud, heavy snoring.  Morning headaches.  Trouble concentrating.  Forgetfulness.  Decreased interest in sex.  Unexplained sleepiness. DIAGNOSIS  In order to diagnose sleep apnea, your caregiver will perform a physical examination. A sleep study done in the comfort of your own home may be appropriate if you are otherwise healthy.  Your caregiver may also recommend that you spend the night in a sleep lab. In the sleep lab, several monitors record information about your heart, lungs, and brain while you sleep. Your leg and arm movements and blood oxygen level are also recorded. TREATMENT The following actions may help to resolve mild sleep apnea:  Sleeping on your side.   Using a decongestant if you have nasal congestion.   Avoiding the use of depressants, including alcohol, sedatives, and narcotics.   Losing weight and modifying your diet if you are overweight. There also are devices and treatments to help open your airway:  Oral appliances. These are custom-made mouthpieces that shift your lower jaw forward and slightly open your bite. This opens your airway.  Devices that create positive airway pressure. This positive pressure "splints" your airway open to help you breathe better during sleep. The following devices create positive airway pressure:  Continuous positive airway pressure (CPAP) device. The CPAP device creates a continuous level of air pressure with an air pump. The air is delivered to your airway through a mask while you sleep. This continuous pressure keeps your airway open.  Nasal expiratory positive airway pressure (EPAP) device. The EPAP device creates positive air pressure as you exhale. The device consists of single-use valves, which are inserted into each nostril and held in place by adhesive. The valves create very little resistance when you inhale but create much more resistance when you exhale. That increased resistance creates the positive airway pressure. This positive pressure while you exhale keeps your airway open, making it easier to breath when you inhale again.  Bilevel positive airway pressure (BPAP) device. The BPAP device is used mainly in  patients with central sleep apnea. This device is similar to the CPAP device because it also uses an air pump to deliver continuous air pressure  through a mask. However, with the BPAP machine, the pressure is set at two different levels. The pressure when you exhale is lower than the pressure when you inhale.  Surgery. Typically, surgery is only done if you cannot comply with less invasive treatments or if the less invasive treatments do not improve your condition. Surgery involves removing excess tissue in your airway to create a wider passage way.   This information is not intended to replace advice given to you by your health care provider. Make sure you discuss any questions you have with your health care provider.   Document Released: 12/28/2001 Document Revised: 01/28/2014 Document Reviewed: 05/16/2011 Elsevier Interactive Patient Education [employer]

## 2014-11-07 ENCOUNTER — Encounter: Payer: Self-pay | Admitting: Internal Medicine

## 2014-12-09 ENCOUNTER — Telehealth: Payer: Self-pay

## 2014-12-09 NOTE — Telephone Encounter (Signed)
Pt inquiring about her C-PAP medicine that she needed over a month ago.

## 2014-12-09 NOTE — Telephone Encounter (Signed)
Order was faxed to [facility] on 11/07/14. Conacted [facility] they stated that they had an old phone number for the pt, Unpdated their contact information, and requested they contact her ASAP  Advised pt of status, asked pt to get back to me if they have not heard from [facility] by Tuesday.

## 2014-12-12 DIAGNOSIS — H52221 Regular astigmatism, right eye: Secondary | ICD-10-CM | POA: Insufficient documentation

## 2014-12-12 DIAGNOSIS — H2510 Age-related nuclear cataract, unspecified eye: Secondary | ICD-10-CM | POA: Insufficient documentation

## 2014-12-21 ENCOUNTER — Ambulatory Visit: Payer: Medicare Other | Admitting: Internal Medicine

## 2014-12-27 DIAGNOSIS — H52222 Regular astigmatism, left eye: Secondary | ICD-10-CM | POA: Insufficient documentation

## 2015-01-24 ENCOUNTER — Ambulatory Visit: Payer: Medicare Other | Admitting: Internal Medicine

## 2015-02-01 ENCOUNTER — Ambulatory Visit (INDEPENDENT_AMBULATORY_CARE_PROVIDER_SITE_OTHER): Payer: Medicare Other | Admitting: Internal Medicine

## 2015-02-01 ENCOUNTER — Encounter: Payer: Self-pay | Admitting: Internal Medicine

## 2015-02-01 VITALS — BP 133/74 | HR 76 | Temp 98.1°F | Ht 66.5 in | Wt 230.2 lb

## 2015-02-01 DIAGNOSIS — E119 Type 2 diabetes mellitus without complications: Secondary | ICD-10-CM

## 2015-02-01 DIAGNOSIS — G4733 Obstructive sleep apnea (adult) (pediatric): Secondary | ICD-10-CM

## 2015-02-01 DIAGNOSIS — F4323 Adjustment disorder with mixed anxiety and depressed mood: Secondary | ICD-10-CM | POA: Diagnosis not present

## 2015-02-01 DIAGNOSIS — I10 Essential (primary) hypertension: Secondary | ICD-10-CM | POA: Diagnosis not present

## 2015-02-01 LAB — COMPREHENSIVE METABOLIC PANEL
ALT: 23 U/L (ref 0–35)
AST: 20 U/L (ref 0–37)
Albumin: 4.1 g/dL (ref 3.5–5.2)
Alkaline Phosphatase: 80 U/L (ref 39–117)
BILIRUBIN TOTAL: 1.1 mg/dL (ref 0.2–1.2)
BUN: 20 mg/dL (ref 6–23)
CO2: 28 meq/L (ref 19–32)
CREATININE: 0.79 mg/dL (ref 0.40–1.20)
Calcium: 9.7 mg/dL (ref 8.4–10.5)
Chloride: 104 mEq/L (ref 96–112)
GFR: 75.54 mL/min (ref >60.00)
GLUCOSE: 141 mg/dL — AB (ref 70–99)
Potassium: 4.1 mEq/L (ref 3.5–5.1)
SODIUM: 140 meq/L (ref 135–145)
Total Protein: 6.1 g/dL (ref 6.0–8.3)

## 2015-02-01 LAB — LIPID PANEL
Cholesterol: 113 mg/dL (ref 0–200)
HDL: 42.5 mg/dL (ref >39.00)
LDL Cholesterol: 50 mg/dL (ref 0–99)
NONHDL: 70.41
Total CHOL/HDL Ratio: 3
Triglycerides: 104 mg/dL (ref 0.0–149.0)
VLDL: 20.8 mg/dL (ref 0.0–40.0)

## 2015-02-01 LAB — MICROALBUMIN / CREATININE URINE RATIO
Creatinine,U: 85.3 mg/dL
Microalb Creat Ratio: 0.8 mg/g (ref 0.0–30.0)

## 2015-02-01 LAB — HEMOGLOBIN A1C: Hgb A1c MFr Bld: 7.1 % — ABNORMAL HIGH (ref 4.6–6.5)

## 2015-02-01 MED ORDER — FLUOXETINE HCL 20 MG PO TABS: 20.0000 mg | ORAL_TABLET | Freq: Every day | ORAL | Status: DC

## 2015-02-01 NOTE — Progress Notes (Signed)
Pre visit review using our clinic review tool, if applicable. No additional management support is needed unless otherwise documented below in the visit note. 

## 2015-02-01 NOTE — Assessment & Plan Note (Signed)
Will check A1c with labs.  Continue current medication. 

## 2015-02-01 NOTE — Progress Notes (Signed)
Subjective:    Patient ID: Courtney Stone, female    DOB: [birth year], 75 y.o.   MRN: [id number]  HPI  75YO female presents for follow up.  Feeling more depressed. Husband has heart failure and this has been difficult.  Has strong support network. Would like to consider medication for this. Several years ago, took Prozac with some improvement in depressed mood.  DM - BG in 130s mostly. Range 120-150. Compliant with medicatoin.  Recently had bilateral cataract extraction. Tolerated well.  Wt Readings from Last 3 Encounters:  02/01/15 230 lb 4 oz (104.441 kg)  10/26/14 236 lb 2 oz (107.106 kg)  09/28/14 237 lb (107.502 kg)   BP Readings from Last 3 Encounters:  02/01/15 133/74  10/26/14 123/78  09/28/14 135/77    Past Medical History  Diagnosis Date  . HTN (hypertension)     120/80's at home  . Diabetes mellitus    Family History  Problem Relation Age of Onset  . Cancer Mother     lung  . Cancer Father   . COPD Father   . Diabetes Sister   . Hypertension Sister    Past Surgical History  Procedure Laterality Date  . Abdominal hysterectomy  1976    complete  . Breast surgery  1968    benign  . Urethra surgery    . Neck surgery  1993    ?bone spur removal  . Wrist fracture surgery    . Cataract extraction, bilateral  2016    [facility]    Social History   Social History  . Marital Status: Married    Spouse Name: N/A  . Number of Children: N/A  . Years of Education: N/A   Social History Main Topics  . Smoking status: Never Smoker   . Smokeless tobacco: Never Used  . Alcohol Use: No  . Drug Use: No  . Sexual Activity: Not Asked   Other Topics Concern  . None   Social History Narrative   [place] in [place]   Work - Runner, broadcasting/film/videoteacher    Review of Systems  Constitutional: Negative for fever, chills, appetite change, fatigue and unexpected weight change.  Eyes: Negative for visual disturbance.  Respiratory: Negative for cough and shortness of  breath.   Cardiovascular: Negative for chest pain, palpitations and leg swelling.  Gastrointestinal: Negative for nausea, vomiting, abdominal pain, diarrhea and constipation.  Musculoskeletal: Negative for myalgias and arthralgias.  Skin: Negative for color change and rash.  Hematological: Negative for adenopathy. Does not bruise/bleed easily.  Psychiatric/Behavioral: Positive for dysphoric mood. Negative for suicidal ideas and sleep disturbance. The patient is not nervous/anxious.        Objective:    BP 133/74 mmHg  Pulse 76  Temp(Src) 98.1 F (36.7 C) (Oral)  Ht 5' 6.5" (1.689 m)  Wt 230 lb 4 oz (104.441 kg)  BMI 36.61 kg/m2  SpO2 99% Physical Exam  Constitutional: She is oriented to person, place, and time. She appears well-developed and well-nourished. No distress.  HENT:  Head: Normocephalic and atraumatic.  Right Ear: External ear normal.  Left Ear: External ear normal.  Nose: Nose normal.  Mouth/Throat: Oropharynx is clear and moist. No oropharyngeal exudate.  Eyes: Conjunctivae are normal. Pupils are equal, round, and reactive to light. Right eye exhibits no discharge. Left eye exhibits no discharge. No scleral icterus.  Neck: Normal range of motion. Neck supple. No tracheal deviation present. No thyromegaly present.  Cardiovascular: Normal rate, regular rhythm, normal heart sounds and  intact distal pulses.  Exam reveals no gallop and no friction rub.   No murmur heard. Pulmonary/Chest: Effort normal and breath sounds normal. No respiratory distress. She has no wheezes. She has no rales. She exhibits no tenderness.  Musculoskeletal: Normal range of motion. She exhibits no edema or tenderness.  Lymphadenopathy:    She has no cervical adenopathy.  Neurological: She is alert and oriented to person, place, and time. No cranial nerve deficit. She exhibits normal muscle tone. Coordination normal.  Skin: Skin is warm and dry. No rash noted. She is not diaphoretic. No erythema.  No pallor.  Psychiatric: Her speech is normal and behavior is normal. Judgment and thought content normal. She exhibits a depressed mood. She expresses no suicidal ideation.          Assessment & Plan:   Problem List Items Addressed This Visit      Unprioritized   Adjustment disorder with mixed anxiety and depressed mood    Recent worsening symptoms of depressed mood with family stressors contributing. Will start Fluoxetine. Also offered referral for counseling, however she prefers to hold for now. Follow up 4 weeks and prn.      Relevant Medications   FLUoxetine (PROZAC) 20 MG tablet   Diabetes mellitus type 2, controlled (HCC) - Primary    Will check A1c with labs. Continue current medication.      Relevant Orders   Comprehensive metabolic panel   Hemoglobin A1c   Lipid panel   Microalbumin / creatinine urine ratio   Hypertension    BP Readings from Last 3 Encounters:  02/01/15 133/74  10/26/14 123/78  09/28/14 135/77   BP well controlled. Renal function with labs. Continue current medication.      Obstructive sleep apnea    Sleep evaluation pending. Will follow          Return in about 4 weeks (around 03/01/2015) for Recheck.

## 2015-02-01 NOTE — Assessment & Plan Note (Signed)
BP Readings from Last 3 Encounters:  02/01/15 133/74  10/26/14 123/78  09/28/14 135/77   BP well controlled. Renal function with labs. Continue current medication.

## 2015-02-01 NOTE — Assessment & Plan Note (Signed)
Sleep evaluation pending. Will follow

## 2015-02-01 NOTE — Assessment & Plan Note (Signed)
Recent worsening symptoms of depressed mood with family stressors contributing. Will start Fluoxetine. Also offered referral for counseling, however she prefers to hold for now. Follow up 4 weeks and prn.

## 2015-02-01 NOTE — Patient Instructions (Addendum)
Start Fluoxetine 20mg  daily in the morning.  Labs today.

## 2015-02-07 ENCOUNTER — Encounter: Payer: Self-pay | Admitting: Internal Medicine

## 2015-02-09 ENCOUNTER — Telehealth: Payer: Self-pay | Admitting: Internal Medicine

## 2015-02-09 NOTE — Telephone Encounter (Signed)
Pt dropped off sleep study results. Envelope is in Dr. Tilman Neat box.

## 2015-02-09 NOTE — Telephone Encounter (Signed)
Follow up to letter from patient

## 2015-03-02 ENCOUNTER — Encounter: Payer: Self-pay | Admitting: Internal Medicine

## 2015-03-02 ENCOUNTER — Ambulatory Visit (INDEPENDENT_AMBULATORY_CARE_PROVIDER_SITE_OTHER): Payer: Medicare Other | Admitting: Internal Medicine

## 2015-03-02 VITALS — BP 122/73 | HR 65 | Temp 97.8°F | Ht 67.0 in | Wt 229.5 lb

## 2015-03-02 DIAGNOSIS — F4323 Adjustment disorder with mixed anxiety and depressed mood: Secondary | ICD-10-CM

## 2015-03-02 DIAGNOSIS — G4733 Obstructive sleep apnea (adult) (pediatric): Secondary | ICD-10-CM | POA: Diagnosis not present

## 2015-03-02 DIAGNOSIS — E669 Obesity, unspecified: Secondary | ICD-10-CM

## 2015-03-02 DIAGNOSIS — E119 Type 2 diabetes mellitus without complications: Secondary | ICD-10-CM

## 2015-03-02 NOTE — Progress Notes (Signed)
Pre visit review using our clinic review tool, if applicable. No additional management support is needed unless otherwise documented below in the visit note. 

## 2015-03-02 NOTE — Patient Instructions (Signed)
Call if any blood sugars less than 70.  Follow up in 3 months.

## 2015-03-02 NOTE — Assessment & Plan Note (Signed)
BG improved. Encouraged continued healthy diet and compliance with medication. Call if any BG<70.

## 2015-03-02 NOTE — Progress Notes (Signed)
Subjective:    Patient ID: Courtney Stone, female    DOB: 1940/11/16, 75 y.o.   MRN: [id number]  HPI  75YO female presents for follow up.  Last visit started on Fluoxetine to help with depression.  Symptoms improving. Tolerating medication well. Trying to take time to relax, meditate. Husband generally improving. Continues to have some anxiety.  OSA - Trying to tolerate CPAP. Frustrated by issues with sleep study, insurance and CPAP.  DM - BG running in low 100s. Lower than before. Has lost some weight by following a healthy diet with her husband.   Wt Readings from Last 3 Encounters:  03/02/15 229 lb 8 oz (104.101 kg)  02/01/15 230 lb 4 oz (104.441 kg)  10/26/14 236 lb 2 oz (107.106 kg)   BP Readings from Last 3 Encounters:  03/02/15 122/73  02/01/15 133/74  10/26/14 123/78    Past Medical History  Diagnosis Date  . HTN (hypertension)     120/80's at home  . Diabetes mellitus    Family History  Problem Relation Age of Onset  . Cancer Mother     lung  . Cancer Father   . COPD Father   . Diabetes Sister   . Hypertension Sister    Past Surgical History  Procedure Laterality Date  . Abdominal hysterectomy  1976    complete  . Breast surgery  1968    benign  . Urethra surgery    . Neck surgery  1993    ?bone spur removal  . Wrist fracture surgery    . Cataract extraction, bilateral  2016    [facility]    Social History   Social History  . Marital Status: Married    Spouse Name: N/A  . Number of Children: N/A  . Years of Education: N/A   Social History Main Topics  . Smoking status: Never Smoker   . Smokeless tobacco: Never Used  . Alcohol Use: No  . Drug Use: No  . Sexual Activity: Not Asked   Other Topics Concern  . None   Social History Narrative   Lives in [place].   Work - Runner, broadcasting/film/video    Review of Systems  Constitutional: Negative for fever, chills, appetite change, fatigue and unexpected weight change.  Eyes: Negative for  visual disturbance.  Respiratory: Negative for cough and shortness of breath.   Cardiovascular: Negative for chest pain and leg swelling.  Gastrointestinal: Negative for nausea, vomiting, abdominal pain, diarrhea and constipation.  Musculoskeletal: Negative for myalgias and arthralgias.  Skin: Negative for color change and rash.  Hematological: Negative for adenopathy. Does not bruise/bleed easily.  Psychiatric/Behavioral: Negative for sleep disturbance and dysphoric mood. The patient is nervous/anxious.        Objective:    BP 122/73 mmHg  Pulse 65  Temp(Src) 97.8 F (36.6 C) (Oral)  Ht  (1.702 m)  Wt 229 lb 8 oz (104.101 kg)  BMI 35.94 kg/m2  SpO2 99% Physical Exam  Constitutional: She is oriented to person, place, and time. She appears well-developed and well-nourished. No distress.  HENT:  Head: Normocephalic and atraumatic.  Right Ear: External ear normal.  Left Ear: External ear normal.  Nose: Nose normal.  Mouth/Throat: Oropharynx is clear and moist. No oropharyngeal exudate.  Eyes: Conjunctivae are normal. Pupils are equal, round, and reactive to light. Right eye exhibits no discharge. Left eye exhibits no discharge. No scleral icterus.  Neck: Normal range of motion. Neck supple. No tracheal deviation present. No thyromegaly  present.  Cardiovascular: Normal rate, regular rhythm, normal heart sounds and intact distal pulses.  Exam reveals no gallop and no friction rub.   No murmur heard. Pulmonary/Chest: Effort normal and breath sounds normal. No respiratory distress. She has no wheezes. She has no rales. She exhibits no tenderness.  Musculoskeletal: Normal range of motion. She exhibits no edema or tenderness.  Lymphadenopathy:    She has no cervical adenopathy.  Neurological: She is alert and oriented to person, place, and time. No cranial nerve deficit. She exhibits normal muscle tone. Coordination normal.  Skin: Skin is warm and dry. No rash noted. She is not  diaphoretic. No erythema. No pallor.  Psychiatric: Her speech is normal and behavior is normal. Judgment and thought content normal. Her mood appears anxious. Cognition and memory are normal.          Assessment & Plan:   Problem List Items Addressed This Visit      Unprioritized   Adjustment disorder with mixed anxiety and depressed mood - Primary    Symptoms improved with Fluoxetine. Will continue to monitor.      Diabetes mellitus type 2, controlled (HCC)    BG improved. Encouraged continued healthy diet and compliance with medication. Call if any BG<70.      Obesity (BMI 30-39.9)    Wt Readings from Last 3 Encounters:  03/02/15 229 lb 8 oz (104.101 kg)  02/01/15 230 lb 4 oz (104.441 kg)  10/26/14 236 lb 2 oz (107.106 kg)   Body mass index is 35.94 kg/(m^2). Congratulated pt on weight loss. Encouraged continued efforts at healthy diet and physical activity.      Obstructive sleep apnea    Tolerating CPAP. Encouraged her to continue with CPAP.          Return in about 3 months (around 05/30/2015) for Recheck of Diabetes.

## 2015-03-02 NOTE — Assessment & Plan Note (Signed)
Symptoms improved with Fluoxetine. Will continue to monitor.

## 2015-03-02 NOTE — Assessment & Plan Note (Signed)
Tolerating CPAP. Encouraged her to continue with CPAP.

## 2015-03-02 NOTE — Assessment & Plan Note (Signed)
Wt Readings from Last 3 Encounters:  03/02/15 229 lb 8 oz (104.101 kg)  02/01/15 230 lb 4 oz (104.441 kg)  10/26/14 236 lb 2 oz (107.106 kg)   Body mass index is 35.94 kg/(m^2). Congratulated pt on weight loss. Encouraged continued efforts at healthy diet and physical activity.

## 2015-03-15 ENCOUNTER — Other Ambulatory Visit: Payer: Self-pay

## 2015-03-15 MED ORDER — GLIPIZIDE ER 2.5 MG PO TB24: 2.5000 mg | ORAL_TABLET | Freq: Every day | ORAL | Status: DC

## 2015-04-26 ENCOUNTER — Ambulatory Visit (INDEPENDENT_AMBULATORY_CARE_PROVIDER_SITE_OTHER): Payer: Medicare Other | Admitting: Internal Medicine

## 2015-04-26 VITALS — BP 137/81 | HR 75 | Temp 98.1°F | Wt 230.5 lb

## 2015-04-26 DIAGNOSIS — M25521 Pain in right elbow: Secondary | ICD-10-CM | POA: Insufficient documentation

## 2015-04-26 NOTE — Assessment & Plan Note (Signed)
Right elbow pain after fall. Will set up ortho evaluation for imaging. Pain currently managed without medication. Will follow up after ortho evaluation.

## 2015-04-26 NOTE — Patient Instructions (Signed)
We will set up an evaluation with orthopedics today.  Please apply ice to your elbow for 10-1620min several times per day.  Please call if pain is worsening or if any other concerns.

## 2015-04-26 NOTE — Progress Notes (Signed)
Pre visit review using our clinic review tool, if applicable. No additional management support is needed unless otherwise documented below in the visit note. 

## 2015-04-26 NOTE — Progress Notes (Signed)
Subjective:    Patient ID: Courtney Stone, female    DOB: [birth year], 75 y.o.   MRN: [id number]  HPI  75YO female presents for acute visit.   Right elbow pain - Fell 4 days ago. Landed on right elbow onto asphalt. Having pain and limited flexion at elbow. No weakness in forearm. Not taking anything for pain. No numbness.   Wt Readings from Last 3 Encounters:  04/26/15 230 lb 8 oz (104.554 kg)  03/02/15 229 lb 8 oz (104.101 kg)  02/01/15 230 lb 4 oz (104.441 kg)   BP Readings from Last 3 Encounters:  04/26/15 137/81  03/02/15 122/73  02/01/15 133/74    Past Medical History  Diagnosis Date  . HTN (hypertension)     120/80's at home  . Diabetes mellitus    Family History  Problem Relation Age of Onset  . Cancer Mother     lung  . Cancer Father   . COPD Father   . Diabetes Sister   . Hypertension Sister    Past Surgical History  Procedure Laterality Date  . Abdominal hysterectomy  1976    complete  . Breast surgery  1968    benign  . Urethra surgery    . Neck surgery  1993    ?bone spur removal  . Wrist fracture surgery    . Cataract extraction, bilateral  2016    [facility]    Social History   Social History  . Marital Status: Married    Spouse Name: N/A  . Number of Children: N/A  . Years of Education: N/A   Social History Main Topics  . Smoking status: Never Smoker   . Smokeless tobacco: Never Used  . Alcohol Use: No  . Drug Use: No  . Sexual Activity: Not on file   Other Topics Concern  . Not on file   Social History Narrative   Lives in [place].   Work - Runner, broadcasting/film/videoteacher    Review of Systems  Constitutional: Negative for fever, chills, appetite change, fatigue and unexpected weight change.  Eyes: Negative for visual disturbance.  Respiratory: Negative for shortness of breath.   Cardiovascular: Negative for chest pain and leg swelling.  Gastrointestinal: Negative for abdominal pain.  Musculoskeletal: Positive for myalgias, joint  swelling and arthralgias.  Skin: Positive for color change. Negative for rash.  Neurological: Positive for weakness (flexion right arm). Negative for numbness.  Hematological: Negative for adenopathy. Does not bruise/bleed easily.  Psychiatric/Behavioral: Negative for dysphoric mood. The patient is not nervous/anxious.        Objective:    BP 137/81 mmHg  Pulse 75  Temp(Src) 98.1 F (36.7 C) (Oral)  Wt 230 lb 8 oz (104.554 kg)  SpO2 99% Physical Exam  Constitutional: She is oriented to person, place, and time. She appears well-developed and well-nourished. No distress.  HENT:  Head: Normocephalic and atraumatic.  Right Ear: External ear normal.  Left Ear: External ear normal.  Nose: Nose normal.  Mouth/Throat: Oropharynx is clear and moist.  Eyes: Conjunctivae are normal. Pupils are equal, round, and reactive to light. Right eye exhibits no discharge. Left eye exhibits no discharge. No scleral icterus.  Neck: Normal range of motion. Neck supple. No tracheal deviation present. No thyromegaly present.  Cardiovascular: Normal rate, regular rhythm, normal heart sounds and intact distal pulses.  Exam reveals no gallop and no friction rub.   No murmur heard. Pulmonary/Chest: Effort normal and breath sounds normal. No respiratory distress. She has no  wheezes. She has no rales. She exhibits no tenderness.  Musculoskeletal: She exhibits no edema.       Right elbow: She exhibits decreased range of motion and swelling. Tenderness found. Olecranon process tenderness noted.       Arms: Lymphadenopathy:    She has no cervical adenopathy.  Neurological: She is alert and oriented to person, place, and time. No cranial nerve deficit. She exhibits normal muscle tone. Coordination normal.  Skin: Skin is warm and dry. No rash noted. She is not diaphoretic. No erythema. No pallor.  Psychiatric: She has a normal mood and affect. Her behavior is normal. Judgment and thought content normal.           Assessment & Plan:   Problem List Items Addressed This Visit      Unprioritized   Right elbow pain - Primary    Right elbow pain after fall. Will set up ortho evaluation for imaging. Pain currently managed without medication. Will follow up after ortho evaluation.      Relevant Orders   Ambulatory referral to Orthopedic Surgery       No Follow-up on file.  Courtney Polio, MD Internal Medicine [facility]

## 2015-05-03 ENCOUNTER — Telehealth: Payer: Self-pay | Admitting: Internal Medicine

## 2015-05-03 NOTE — Telephone Encounter (Signed)
Raynelle FanningJulie [phone] called from [facility] in [place]  regarding pt starting a new medication called Statin? Being that pt is a diabetic this medication is needed. Fax number [phone]. [email or web address]

## 2015-05-31 ENCOUNTER — Ambulatory Visit (INDEPENDENT_AMBULATORY_CARE_PROVIDER_SITE_OTHER): Payer: Medicare Other | Admitting: Internal Medicine

## 2015-05-31 ENCOUNTER — Encounter: Payer: Self-pay | Admitting: Internal Medicine

## 2015-05-31 VITALS — BP 132/86 | HR 71 | Temp 98.4°F | Ht 67.0 in | Wt 230.1 lb

## 2015-05-31 DIAGNOSIS — E079 Disorder of thyroid, unspecified: Secondary | ICD-10-CM | POA: Diagnosis not present

## 2015-05-31 DIAGNOSIS — I10 Essential (primary) hypertension: Secondary | ICD-10-CM

## 2015-05-31 DIAGNOSIS — E119 Type 2 diabetes mellitus without complications: Secondary | ICD-10-CM

## 2015-05-31 LAB — COMPREHENSIVE METABOLIC PANEL
ALT: 15 U/L (ref 0–35)
AST: 17 U/L (ref 0–37)
Albumin: 4.2 g/dL (ref 3.5–5.2)
Alkaline Phosphatase: 63 U/L (ref 39–117)
BUN: 17 mg/dL (ref 6–23)
CHLORIDE: 105 meq/L (ref 96–112)
CO2: 28 meq/L (ref 19–32)
Calcium: 9.6 mg/dL (ref 8.4–10.5)
Creatinine, Ser: 0.86 mg/dL (ref 0.40–1.20)
GFR: 68.43 mL/min (ref >60.00)
GLUCOSE: 100 mg/dL — AB (ref 70–99)
POTASSIUM: 4.2 meq/L (ref 3.5–5.1)
SODIUM: 142 meq/L (ref 135–145)
Total Bilirubin: 1.2 mg/dL (ref 0.2–1.2)
Total Protein: 6.2 g/dL (ref 6.0–8.3)

## 2015-05-31 LAB — TSH: TSH: 0.25 u[IU]/mL — ABNORMAL LOW (ref 0.35–4.50)

## 2015-05-31 LAB — HEMOGLOBIN A1C: Hgb A1c MFr Bld: 6.7 % — ABNORMAL HIGH (ref 4.6–6.5)

## 2015-05-31 NOTE — Progress Notes (Signed)
Pre visit review using our clinic review tool, if applicable. No additional management support is needed unless otherwise documented below in the visit note. 

## 2015-05-31 NOTE — Assessment & Plan Note (Signed)
BP Readings from Last 3 Encounters:  05/31/15 132/86  04/26/15 137/81  03/02/15 122/73   BP well controlled. Continue current medications. Renal function with labs.

## 2015-05-31 NOTE — Assessment & Plan Note (Signed)
Repeat TSH with labs today. 

## 2015-05-31 NOTE — Progress Notes (Signed)
Subjective:    Patient ID: Courtney Stone, female    DOB: 05/06/1940, 75 y.o.   MRN: [id number]  HPI  75YO female presents for follow up.  Recently seen by Dr. Lenard ForthMundy to follow up right radial head fracture. Doing well. Working on range of motion. No recent pain.  DM - BG have been in the 130s. No lows below 70. Compliant with medication.  Wt Readings from Last 3 Encounters:  05/31/15 230 lb 2 oz (104.384 kg)  04/26/15 230 lb 8 oz (104.554 kg)  03/02/15 229 lb 8 oz (104.101 kg)   BP Readings from Last 3 Encounters:  05/31/15 132/86  04/26/15 137/81  03/02/15 122/73    Past Medical History  Diagnosis Date  . HTN (hypertension)     120/80's at home  . Diabetes mellitus    Family History  Problem Relation Age of Onset  . Cancer Mother     lung  . Cancer Father   . COPD Father   . Diabetes Sister   . Hypertension Sister    Past Surgical History  Procedure Laterality Date  . Abdominal hysterectomy  1976    complete  . Breast surgery  1968    benign  . Urethra surgery    . Neck surgery  1993    ?bone spur removal  . Wrist fracture surgery    . Cataract extraction, bilateral  2016    [facility]    Social History   Social History  . Marital Status: Married    Spouse Name: N/A  . Number of Children: N/A  . Years of Education: N/A   Social History Main Topics  . Smoking status: Never Smoker   . Smokeless tobacco: Never Used  . Alcohol Use: No  . Drug Use: No  . Sexual Activity: Not Asked   Other Topics Concern  . None   Social History Narrative   [place]ives in [place].   Work - Runner, broadcasting/film/videoteacher    Review of Systems  Constitutional: Negative for fever, chills, appetite change, fatigue and unexpected weight change.  Eyes: Negative for visual disturbance.  Respiratory: Negative for shortness of breath.   Cardiovascular: Negative for chest pain and leg swelling.  Gastrointestinal: Negative for nausea, vomiting, abdominal pain, diarrhea and  constipation.  Musculoskeletal: Positive for myalgias and arthralgias.  Skin: Negative for color change and rash.  Hematological: Negative for adenopathy. Does not bruise/bleed easily.  Psychiatric/Behavioral: Negative for sleep disturbance and dysphoric mood. The patient is not nervous/anxious.        Objective:    BP 132/86 mmHg  Pulse 71  Temp(Src) 98.4 F (36.9 C) (Oral)  Ht 5\' 7"  (1.702 m)  Wt 230 lb 2 oz (104.384 kg)  BMI 36.03 kg/m2  SpO2 97% Physical Exam  Constitutional: She is oriented to person, place, and time. She appears well-developed and well-nourished. No distress.  HENT:  Head: Normocephalic and atraumatic.  Right Ear: External ear normal.  Left Ear: External ear normal.  Nose: Nose normal.  Mouth/Throat: Oropharynx is clear and moist. No oropharyngeal exudate.  Eyes: Conjunctivae are normal. Pupils are equal, round, and reactive to light. Right eye exhibits no discharge. Left eye exhibits no discharge. No scleral icterus.  Neck: Normal range of motion. Neck supple. No tracheal deviation present. No thyromegaly present.  Cardiovascular: Normal rate, regular rhythm, normal heart sounds and intact distal pulses.  Exam reveals no gallop and no friction rub.   No murmur heard. Pulmonary/Chest: Effort normal and breath  sounds normal. No respiratory distress. She has no wheezes. She has no rales. She exhibits no tenderness.  Musculoskeletal: Normal range of motion. She exhibits no edema or tenderness.  Lymphadenopathy:    She has no cervical adenopathy.  Neurological: She is alert and oriented to person, place, and time. No cranial nerve deficit. She exhibits normal muscle tone. Coordination normal.  Skin: Skin is warm and dry. No rash noted. She is not diaphoretic. No erythema. No pallor.  Psychiatric: She has a normal mood and affect. Her behavior is normal. Judgment and thought content normal.          Assessment & Plan:   Problem List Items Addressed This  Visit      Unprioritized   Diabetes mellitus type 2, controlled (HCC)    Will check A1c with labs. Continue Metformin and Glipizide.      Relevant Orders   Hemoglobin A1c   Hypertension - Primary    BP Readings from Last 3 Encounters:  05/31/15 132/86  04/26/15 137/81  03/02/15 122/73   BP well controlled. Continue current medications. Renal function with labs.      Relevant Orders   Comprehensive metabolic panel   Thyroid disorder    Repeat TSH with labs today.      Relevant Orders   TSH       Return in about 3 months (around 08/31/2015) for Recheck of Diabetes.  Ronna Polio, MD Internal Medicine [facility]

## 2015-05-31 NOTE — Patient Instructions (Signed)
Labs today.   Follow up in 3 months.  

## 2015-05-31 NOTE — Assessment & Plan Note (Signed)
Will check A1c with labs. Continue Metformin and Glipizide. 

## 2015-06-12 ENCOUNTER — Other Ambulatory Visit: Payer: Self-pay | Admitting: Internal Medicine

## 2015-07-27 ENCOUNTER — Telehealth: Payer: Self-pay

## 2015-07-27 NOTE — Telephone Encounter (Signed)
Patient is on the list for [employer] 2017 and may be a good candidate for an AWV in 2017. Please let me know if/when appt is scheduled.   Note: pt is transferring to [facility] in August and may be able to do AWV same day.

## 2015-07-31 NOTE — Telephone Encounter (Signed)
Pt sch for AWV on 8/15, [place]

## 2015-08-17 LAB — HM DIABETES EYE EXAM

## 2015-09-05 ENCOUNTER — Ambulatory Visit (INDEPENDENT_AMBULATORY_CARE_PROVIDER_SITE_OTHER): Payer: Medicare Other

## 2015-09-05 ENCOUNTER — Ambulatory Visit: Payer: Medicare Other | Admitting: Internal Medicine

## 2015-09-05 ENCOUNTER — Ambulatory Visit (INDEPENDENT_AMBULATORY_CARE_PROVIDER_SITE_OTHER): Payer: Medicare Other | Admitting: Primary Care

## 2015-09-05 ENCOUNTER — Encounter: Payer: Self-pay | Admitting: Primary Care

## 2015-09-05 VITALS — BP 122/76 | HR 64 | Temp 98.5°F | Ht 66.5 in | Wt 227.5 lb

## 2015-09-05 DIAGNOSIS — E119 Type 2 diabetes mellitus without complications: Secondary | ICD-10-CM

## 2015-09-05 DIAGNOSIS — G4733 Obstructive sleep apnea (adult) (pediatric): Secondary | ICD-10-CM

## 2015-09-05 DIAGNOSIS — I10 Essential (primary) hypertension: Secondary | ICD-10-CM

## 2015-09-05 DIAGNOSIS — Z0000 Encounter for general adult medical examination without abnormal findings: Secondary | ICD-10-CM | POA: Diagnosis not present

## 2015-09-05 DIAGNOSIS — E079 Disorder of thyroid, unspecified: Secondary | ICD-10-CM

## 2015-09-05 DIAGNOSIS — F4323 Adjustment disorder with mixed anxiety and depressed mood: Secondary | ICD-10-CM

## 2015-09-05 DIAGNOSIS — Z1239 Encounter for other screening for malignant neoplasm of breast: Secondary | ICD-10-CM | POA: Diagnosis not present

## 2015-09-05 NOTE — Patient Instructions (Signed)
Continue your efforts towards a healthy lifestyle through diet and exercise.  Start exercising. You should be getting 1 hour of moderate intensity exercise 3-5 days weekly.  Please e-mail me once you need refills so I can update the prescription. We will only need to do this once per medication.  Schedule a lab only appointment in 3 months for re-evaluation of A1C.  It was a pleasure to meet you today! Please don't hesitate to call me with any questions. [facility]!  Diabetes Mellitus and Food It is important for you to manage your blood sugar (glucose) level. Your blood glucose level can be greatly affected by what you eat. Eating healthier foods in the appropriate amounts throughout the day at about the same time each day will help you control your blood glucose level. It can also help slow or prevent worsening of your diabetes mellitus. Healthy eating may even help you improve the level of your blood pressure and reach or maintain a healthy weight.  General recommendations for healthful eating and cooking habits include:  Eating meals and snacks regularly. Avoid going long periods of time without eating to lose weight.  Eating a diet that consists mainly of plant-based foods, such as fruits, vegetables, nuts, legumes, and whole grains.  Using low-heat cooking methods, such as baking, instead of high-heat cooking methods, such as deep frying. Work with your dietitian to make sure you understand how to use the Nutrition Facts information on food labels. HOW CAN FOOD AFFECT ME? Carbohydrates Carbohydrates affect your blood glucose level more than any other type of food. Your dietitian will help you determine how many carbohydrates to eat at each meal and teach you how to count carbohydrates. Counting carbohydrates is important to keep your blood glucose at a healthy level, especially if you are using insulin or taking certain medicines for diabetes  mellitus. Alcohol Alcohol can cause sudden decreases in blood glucose (hypoglycemia), especially if you use insulin or take certain medicines for diabetes mellitus. Hypoglycemia can be a life-threatening condition. Symptoms of hypoglycemia (sleepiness, dizziness, and disorientation) are similar to symptoms of having too much alcohol.  If your health care provider has given you approval to drink alcohol, do so in moderation and use the following guidelines:  Women should not have more than one drink per day, and men should not have more than two drinks per day. One drink is equal to:  12 oz of beer.  5 oz of wine.  1 oz of hard liquor.  Do not drink on an empty stomach.  Keep yourself hydrated. Have water, diet soda, or unsweetened iced tea.  Regular soda, juice, and other mixers might contain a lot of carbohydrates and should be counted. WHAT FOODS ARE NOT RECOMMENDED? As you make food choices, it is important to remember that all foods are not the same. Some foods have fewer nutrients per serving than other foods, even though they might have the same number of calories or carbohydrates. It is difficult to get your body what it needs when you eat foods with fewer nutrients. Examples of foods that you should avoid that are high in calories and carbohydrates but low in nutrients include:  Trans fats (most processed foods list trans fats on the Nutrition Facts label).  Regular soda.  Juice.  Candy.  Sweets, such as cake, pie, doughnuts, and cookies.  Fried foods. WHAT FOODS CAN I EAT? Eat nutrient-rich foods, which will nourish your body and keep you healthy. The  food you should eat also will depend on several factors, including:  The calories you need.  The medicines you take.  Your weight.  Your blood glucose level.  Your blood pressure level.  Your cholesterol level. You should eat a variety of foods, including:  Protein.  Lean cuts of meat.  Proteins low in  saturated fats, such as fish, egg whites, and beans. Avoid processed meats.  Fruits and vegetables.  Fruits and vegetables that may help control blood glucose levels, such as apples, mangoes, and yams.  Dairy products.  Choose fat-free or low-fat dairy products, such as milk, yogurt, and cheese.  Grains, bread, pasta, and rice.  Choose whole grain products, such as multigrain bread, whole oats, and brown rice. These foods may help control blood pressure.  Fats.  Foods containing healthful fats, such as nuts, avocado, olive oil, canola oil, and fish. DOES EVERYONE WITH DIABETES MELLITUS HAVE THE SAME MEAL PLAN? Because every person with diabetes mellitus is different, there is not one meal plan that works for everyone. It is very important that you meet with a dietitian who will help you create a meal plan that is just right for you.   This information is not intended to replace advice given to you by your health care provider. Make sure you discuss any questions you have with your health care provider.   Document Released: 10/04/2004 Document Revised: 01/28/2014 Document Reviewed: 12/04/2012 Elsevier Interactive Patient Education [employer]2016 Elsevier Inc.

## 2015-09-05 NOTE — Assessment & Plan Note (Signed)
Labs with evidence of hyperthyroidism, she claims to follow with the specialist through ENT. Will review her chart in greater detail for further information. Overall seems that her thyroid function has been stable.

## 2015-09-05 NOTE — Assessment & Plan Note (Signed)
Feels well managed on Prozac 20 mg. Denies SI/HI.

## 2015-09-05 NOTE — Progress Notes (Signed)
PCP notes:   Health maintenance:  Flu vaccine - addressed Mammogram - ordered Foot exam - will complete at CPE Tetanus - postponed/insurance  Abnormal screenings:   Hearing - failed Fall risk - hx of fall with injury  Patient concerns:   None  Nurse concerns:  None  Next PCP appt:   09/05/2015 @ 1015

## 2015-09-05 NOTE — Progress Notes (Signed)
Subjective:    Patient ID: Courtney Stone, female    DOB: 09/02/1940, 75 y.o.   MRN: [id number]  HPI  Courtney Stone is a 75 year old female who presents today to transfer care from [facility]. She was seen earlier today for her annual wellness visit.  1) Type 2 Diabetes: Diagnosed 5-6 years ago. Currently managed on Metformin XR 500 mg at bedtime and Glipizide XL 2.5 mg. A1C in May 2017 of 6.7, urine microalbumin on file/UTD and is negative. She has met with a diabetic nutritionist in the past and is familiar with a diabetic diet. She checks her blood sugars three times weekly and gets readings of 120-130 fasting. Denies numbness, tingling, fatigue.  2) Essential Hypertension: Diagnosed several years ago. Currently managed on lisinopril-HCTZ 10/12.5 mg. Her BP in the office today is stable. She does experience edema to her lower extremities occasionally.   3) Anxiety and Depression: Diagnosed years ago. Currently managed on Fluoxitine 20 mg that she recently restarted in December 2016 given increased anxiety and stress and personal life. Previously managed on fluoxetine several years ago. She feels well managed on this medication. Denies SI/HI. She does not currently follow with therapy.   4) Thyroid Disorder: TSH of 0.25 in May 2017. She currently follows with Dr. Pryor Ochoa with ENT annually. Her next appointment is in December 2017. She is not currently managed on medication for hyperthyroidism. Denies chest pain, palpitations, fatigue.  Review of Systems  Constitutional: Negative for fatigue and unexpected weight change.  Respiratory: Negative for shortness of breath.   Cardiovascular: Negative for chest pain and palpitations.  Neurological: Negative for dizziness, numbness and headaches.  Psychiatric/Behavioral: Negative for suicidal ideas. The patient is not nervous/anxious.        Past Medical History:  Diagnosis Date  . Anxiety and depression   . Diabetes mellitus   . HTN  (hypertension)    120/80's at home  . Thyroid disorder      Social History   Social History  . Marital status: Married    Spouse name: N/A  . Number of children: N/A  . Years of education: N/A   Occupational History  . Not on file.   Social History Main Topics  . Smoking status: Never Smoker  . Smokeless tobacco: Never Used  . Alcohol use No  . Drug use: No  . Sexual activity: No   Other Topics Concern  . Not on file   Social History Narrative   Married.    2 children, 2 grand children.   Lives in [place].   Retired, once worked as a Pharmacist, hospital.    Enjoys spending time with family, reading.     Past Surgical History:  Procedure Laterality Date  . ABDOMINAL HYSTERECTOMY  1976   complete  . BREAST SURGERY  1968   benign  . CATARACT EXTRACTION, BILATERAL  2016   [facility]   . NECK SURGERY  1993   ?bone spur removal  . URETHRA SURGERY    . WRIST FRACTURE SURGERY      Family History  Problem Relation Age of Onset  . Cancer Mother     lung  . Cancer Father   . COPD Father   . Diabetes Sister   . Hypertension Sister     Allergies  Allergen Reactions  . Adhesive [Tape]   . Sulfa Antibiotics     Headache    Current Outpatient Prescriptions on File Prior to Visit  Medication Sig Dispense  Refill  . FLUoxetine (PROZAC) 20 MG tablet take 1 tablet by mouth once daily 90 tablet 3  . glipiZIDE (GLUCOTROL XL) 2.5 MG 24 hr tablet Take 1 tablet (2.5 mg total) by mouth daily with breakfast. 90 tablet 1  . lisinopril-hydrochlorothiazide (PRINZIDE,ZESTORETIC) 10-12.5 MG per tablet take 1 tablet by mouth once daily 90 tablet 3  . metFORMIN (GLUCOPHAGE-XR) 500 MG 24 hr tablet Take 1 tablet (500 mg total) by mouth at bedtime. 90 tablet 4  . mometasone (NASONEX) 50 MCG/ACT nasal spray Place 2 sprays into the nose daily. (Patient taking differently: Place 2 sprays into the nose daily as needed. ) 17 g 6  . Multiple Vitamins-Minerals (CENTRUM SILVER PO) Take 1  tablet by mouth daily.    Angelia Mould TEST test strip TEST twice a day as directed 100 each 12  . vitamin B-12 (CYANOCOBALAMIN) 250 MCG tablet Take 250 mcg by mouth daily.     No current facility-administered medications on file prior to visit.     BP 122/76 (BP Location: Left Arm, Patient Position: Sitting, Cuff Size: Normal)   Pulse 64   Temp 98.5 F (36.9 C) (Oral)   Ht 5' 6.5" (1.689 m)   Wt 227 lb 8 oz (103.2 kg)   SpO2 97%   BMI 36.17 kg/m    Objective:   Physical Exam  Constitutional: She appears well-nourished.  Neck: Neck supple.  Cardiovascular: Normal rate and regular rhythm.   Pulmonary/Chest: Effort normal and breath sounds normal.  Skin: Skin is warm and dry.  Psychiatric: She has a normal mood and affect.          Assessment & Plan:

## 2015-09-05 NOTE — Assessment & Plan Note (Addendum)
A1c of 6.7 in May 2017. Urine microalbumin and pneumonia vaccinations up-to-date. Continue glipizide and metformin. Managed on Ace. No current statin therapy. Repeat A1c in 3 months.

## 2015-09-05 NOTE — Progress Notes (Signed)
Pre visit review using our clinic review tool, if applicable. No additional management support is needed unless otherwise documented below in the visit note. 

## 2015-09-05 NOTE — Assessment & Plan Note (Signed)
Stable in the office today, continue lisinopril/HCTZ. BMP stable in May 2017.

## 2015-09-05 NOTE — Progress Notes (Signed)
Subjective:   Courtney Stone is a 75 y.o. female who presents for an Initial Medicare Annual Wellness Visit.  Review of Systems    N/A  Cardiac Risk Factors include: advanced age (>77men, >24 women);obesity (BMI >30kg/m2);diabetes mellitus;sedentary lifestyle     Objective:    Today's Vitals   09/05/15 0951  BP: 122/76  Pulse: 64  Temp: 98.5 F (36.9 C)  TempSrc: Oral  SpO2: 97%  Weight: 227 lb 8 oz (103.2 kg)  Height: 5' 6.5" (1.689 m)  PainSc: 0-No pain   Body mass index is 36.17 kg/m.   Current Medications (verified) Outpatient Encounter Prescriptions as of 09/05/2015  Medication Sig  . FLUoxetine (PROZAC) 20 MG tablet take 1 tablet by mouth once daily  . glipiZIDE (GLUCOTROL XL) 2.5 MG 24 hr tablet Take 1 tablet (2.5 mg total) by mouth daily with breakfast.  . lisinopril-hydrochlorothiazide (PRINZIDE,ZESTORETIC) 10-12.5 MG per tablet take 1 tablet by mouth once daily  . metFORMIN (GLUCOPHAGE-XR) 500 MG 24 hr tablet Take 1 tablet (500 mg total) by mouth at bedtime.  . mometasone (NASONEX) 50 MCG/ACT nasal spray Place 2 sprays into the nose daily. (Patient taking differently: Place 2 sprays into the nose daily as needed. )  . Multiple Vitamins-Minerals (CENTRUM SILVER PO) Take 1 tablet by mouth daily.  Gilman Schmidt TEST test strip TEST twice a day as directed  . vitamin B-12 (CYANOCOBALAMIN) 250 MCG tablet Take 250 mcg by mouth daily.  . [DISCONTINUED] cyclobenzaprine (FLEXERIL) 5 MG tablet Take 1 tablet (5 mg total) by mouth 3 (three) times daily as needed for muscle spasms.   No facility-administered encounter medications on file as of 09/05/2015.     Allergies (verified) Adhesive [tape] and Sulfa antibiotics   History: Past Medical History:  Diagnosis Date  . Diabetes mellitus   . HTN (hypertension)    120/80's at home   Past Surgical History:  Procedure Laterality Date  . ABDOMINAL HYSTERECTOMY  1976   complete  . BREAST SURGERY  1968   benign  .  CATARACT EXTRACTION, BILATERAL  2016   [facility]   . NECK SURGERY  1993   ?bone spur removal  . URETHRA SURGERY    . WRIST FRACTURE SURGERY     Family History  Problem Relation Age of Onset  . Cancer Mother     lung  . Cancer Father   . COPD Father   . Diabetes Sister   . Hypertension Sister    Social History   Occupational History  . Not on file.   Social History Main Topics  . Smoking status: Never Smoker  . Smokeless tobacco: Never Used  . Alcohol use No  . Drug use: No  . Sexual activity: No    Tobacco Counseling Counseling given: No   Activities of Daily Living In your present state of health, do you have any difficulty performing the following activities: 09/05/2015  Hearing? Y  Vision? N  Difficulty concentrating or making decisions? Y  Walking or climbing stairs? N  Dressing or bathing? N  Doing errands, shopping? N  Preparing Food and eating ? N  Using the Toilet? N  In the past six months, have you accidently leaked urine? N  Do you have problems with loss of bowel control? N  Managing your Medications? N  Managing your Finances? N  Housekeeping or managing your Housekeeping? N  Some recent data might be hidden    Immunizations and Health Maintenance Immunization History  Administered  Date(s) Administered  . Influenza Split 09/30/2011  . Influenza,inj,Quad PF,36+ Mos 10/21/2012, 10/16/2013, 10/26/2014  . Pneumococcal Conjugate-13 05/14/2013  . Pneumococcal Polysaccharide-23 06/20/2009  . Zoster 06/20/2009   There are no preventive care reminders to display for this patient.  Patient Care Team: Doreene NestKatherine K Clark, NP as PCP - General (Internal Medicine)  Indicate any recent Medical Services you may have received from other than [facility] providers in the past year (date may be approximate).     Assessment:   This is a routine wellness examination for Courtney Stone.   Hearing/Vision screen  Hearing Screening   125Hz  250Hz  500Hz  1000Hz   2000Hz  3000Hz  4000Hz  6000Hz  8000Hz   Right ear:   0 0 40  0    Left ear:   40 40 40  0    Vision Screening Comments: Last vision exam with Earleen ReaperJames Bryan on August 16, 2015  Dietary issues and exercise activities discussed: Current Exercise Habits: The patient does not participate in regular exercise at present, Exercise limited by: None identified  Goals    . Increase physical activity          Starting 09/05/2015, I will attempt to do at least 15 min of chair exercises daily. Additionally, I will attempt to drink at least 6-8 glasses of water daily.       Depression Screen PHQ 2/9 Scores 09/05/2015 03/14/2014 02/05/2013 12/24/2011  PHQ - 2 Score 0 0 0 0    Fall Risk Fall Risk  09/05/2015 03/14/2014 02/05/2013 12/24/2011  Falls in the past year? Yes No No No  Number falls in past yr: 1 - - -  Injury with Fall? Yes - - -  Risk for fall due to : History of fall(s) - - -  Follow up Falls evaluation completed;Falls prevention discussed - - -    Cognitive Function: MMSE - Mini Mental State Exam 09/05/2015  Orientation to time 5  Orientation to Place 5  Registration 3  Attention/ Calculation 0  Recall 3  Language- name 2 objects 0  Language- repeat 1  Language- follow 3 step command 3  Language- read & follow direction 0  Write a sentence 0  Copy design 0  Total score 20   PLEASE NOTE: A Mini-Cog screen was completed. Maximum score is 20. A value of 0 denotes this part of Folstein MMSE was not completed or the patient failed this part of the Mini-Cog screening.   Mini-Cog Screening Orientation to Time - Max 5 pts Orientation to Place - Max 5 pts Registration - Max 3 pts Recall - Max 3 pts Language Repeat - Max 1 pts Language Follow 3 Step Command - Max 3 pts  Screening Tests Health Maintenance  Topic Date Due  . INFLUENZA VACCINE  01/21/2016 (Originally 08/22/2015)  . MAMMOGRAM  01/21/2016 (Originally 03/16/2015)  . FOOT EXAM  09/04/2016 (Originally 06/13/2015)  . TETANUS/TDAP   09/04/2016 (Originally 09/19/1959)  . HEMOGLOBIN A1C  12/01/2015  . OPHTHALMOLOGY EXAM  08/16/2016  . COLONOSCOPY  06/20/2021  . DEXA SCAN  Completed  . ZOSTAVAX  Completed  . PNA vac Low Risk Adult  Completed      Plan:     I have personally reviewed and addressed the Medicare Annual Wellness questionnaire and have noted the following in the patient's chart:  A. Medical and social history B. Use of alcohol, tobacco or illicit drugs  C. Current medications and supplements D. Functional ability and status E.  Nutritional status F.  Physical activity G. Advance  directives H. List of other physicians I.  Hospitalizations, surgeries, and ER visits in previous 12 months J.  Vitals K. Screenings to include hearing, vision, cognitive, depression L. Referrals and appointments - none  In addition, I have reviewed and discussed with patient certain preventive protocols, quality metrics, and best practice recommendations. A written personalized care plan for preventive services as well as general preventive health recommendations were provided to patient.  See attached scanned questionnaire for additional information.   Signed,   Randa EvensLesia Emile Ringgenberg, MHA, BS, LPN Health Advisor

## 2015-09-05 NOTE — Patient Instructions (Signed)
[name] , Thank you for taking time to come for your Medicare Wellness Visit. I appreciate your ongoing commitment to your health goals. Please review the following plan we discussed and let me know if I can assist you in the future.   These are the goals we discussed: Goals    . Increase physical activity          Starting 09/05/2015, I will attempt to do at least 15 min of chair exercises daily. Additionally, I will attempt to drink at least 6-8 glasses of water daily.        This is a list of the screening recommended for you and due dates:  Health Maintenance  Topic Date Due  . Tetanus Vaccine  09/19/1959  . Mammogram  03/16/2015  . Complete foot exam   06/13/2015  . Flu Shot  01/21/2016*  . Hemoglobin A1C  12/01/2015  . Eye exam for diabetics  08/16/2016  . Colon Cancer Screening  06/20/2021  . DEXA scan (bone density measurement)  Completed  . Shingles Vaccine  Completed  . Pneumonia vaccines  Completed  *Topic was postponed. The date shown is not the original due date.   Preventive Care for Adults  A healthy lifestyle and preventive care can promote health and wellness. Preventive health guidelines for adults include the following key practices.  . A routine yearly physical is a good way to check with your health care provider about your health and preventive screening. It is a chance to share any concerns and updates on your health and to receive a thorough exam.  . Visit your dentist for a routine exam and preventive care every 6 months. Brush your teeth twice a day and floss once a day. Good oral hygiene prevents tooth decay and gum disease.  . The frequency of eye exams is based on your age, health, family medical history, use  of contact lenses, and other factors. Follow your health care provider's ecommendations for frequency of eye exams.  . Eat a healthy diet. Foods like vegetables, fruits, whole grains, low-fat dairy products, and lean protein foods contain  the nutrients you need without too many calories. Decrease your intake of foods high in solid fats, added sugars, and salt. Eat the right amount of calories for you. Get information about a proper diet from your health care provider, if necessary.  . Regular physical exercise is one of the most important things you can do for your health. Most adults should get at least 150 minutes of moderate-intensity exercise (any activity that increases your heart rate and causes you to sweat) each week. In addition, most adults need muscle-strengthening exercises on 2 or more days a week.  Silver Sneakers may be a benefit available to you. To determine eligibility, you may visit the website: www.silversneakers.com or contact program at (262)[phone] Mon-Fri between 8AM-8PM.   . Maintain a healthy weight. The body mass index (BMI) is a screening tool to identify possible weight problems. It provides an estimate of body fat based on height and weight. Your health care provider can find your BMI and can help you achieve or maintain a healthy weight.   For adults 20 years and older: ? A BMI below 18.5 is considered underweight. ? A BMI of 18.5 to 24.9 is normal. ? A BMI of 25 to 29.9 is considered overweight. ? A BMI of 30 and above is considered obese.   . Maintain normal blood lipids and cholesterol levels by exercising and minimizing  your intake of saturated fat. Eat a balanced diet with plenty of fruit and vegetables. Blood tests for lipids and cholesterol should begin at age 17 and be repeated every 5 years. If your lipid or cholesterol levels are high, you are over 50, or you are at high risk for heart disease, you may need your cholesterol levels checked more frequently. Ongoing high lipid and cholesterol levels should be treated with medicines if diet and exercise are not working.  . If you smoke, find out from your health care provider how to quit. If you do not use tobacco, please do not start.  . If  you choose to drink alcohol, please do not consume more than 2 drinks per day. One drink is considered to be 12 ounces (355 mL) of beer, 5 ounces (148 mL) of wine, or 1.5 ounces (44 mL) of liquor.  . If you are 7-32 years old, ask your health care provider if you should take aspirin to prevent strokes.  . Use sunscreen. Apply sunscreen liberally and repeatedly throughout the day. You should seek shade when your shadow is shorter than you. Protect yourself by wearing long sleeves, pants, a wide-brimmed hat, and sunglasses year round, whenever you are outdoors.  . Once a month, do a whole body skin exam, using a mirror to look at the skin on your back. Tell your health care provider of new moles, moles that have irregular borders, moles that are larger than a pencil eraser, or moles that have changed in shape or color.

## 2015-09-05 NOTE — Assessment & Plan Note (Signed)
Sleeps with CPAP every night 

## 2015-09-07 ENCOUNTER — Telehealth: Payer: Self-pay | Admitting: Primary Care

## 2015-09-07 DIAGNOSIS — M8580 Other specified disorders of bone density and structure, unspecified site: Secondary | ICD-10-CM

## 2015-09-07 NOTE — Telephone Encounter (Signed)
Please notify patient that her insurance is requiring a repeat bone density test which is reasonable given her last one was obtained in 2013. I have placed the order to be obtained through [facility]. If she does not hear back from [facility] within one week please have her call to schedule.

## 2015-09-08 NOTE — Progress Notes (Signed)
I reviewed health advisor's note, was available for consultation, and agree with documentation and plan.  

## 2015-09-11 NOTE — Telephone Encounter (Signed)
Spoken and notified patient of Kate's comments. Patient verbalized understanding. 

## 2015-09-25 ENCOUNTER — Other Ambulatory Visit: Payer: Self-pay | Admitting: Primary Care

## 2015-09-25 DIAGNOSIS — E119 Type 2 diabetes mellitus without complications: Secondary | ICD-10-CM

## 2015-10-11 ENCOUNTER — Other Ambulatory Visit: Payer: Self-pay | Admitting: Primary Care

## 2015-10-11 ENCOUNTER — Ambulatory Visit
Admission: RE | Admit: 2015-10-11 | Discharge: 2015-10-11 | Disposition: A | Discharge status: Home / Self Care | Payer: Medicare Other | Source: Ambulatory Visit | Attending: Primary Care | Admitting: Primary Care

## 2015-10-11 DIAGNOSIS — Z1239 Encounter for other screening for malignant neoplasm of breast: Secondary | ICD-10-CM

## 2015-10-11 DIAGNOSIS — Z1231 Encounter for screening mammogram for malignant neoplasm of breast: Secondary | ICD-10-CM | POA: Insufficient documentation

## 2015-10-11 DIAGNOSIS — M8588 Other specified disorders of bone density and structure, other site: Secondary | ICD-10-CM | POA: Diagnosis not present

## 2015-10-11 DIAGNOSIS — M8580 Other specified disorders of bone density and structure, unspecified site: Secondary | ICD-10-CM

## 2015-12-01 ENCOUNTER — Other Ambulatory Visit: Payer: Self-pay | Admitting: Primary Care

## 2015-12-01 DIAGNOSIS — E119 Type 2 diabetes mellitus without complications: Secondary | ICD-10-CM

## 2015-12-06 ENCOUNTER — Other Ambulatory Visit (INDEPENDENT_AMBULATORY_CARE_PROVIDER_SITE_OTHER): Payer: Medicare Other

## 2015-12-06 DIAGNOSIS — E119 Type 2 diabetes mellitus without complications: Secondary | ICD-10-CM

## 2015-12-06 LAB — HEMOGLOBIN A1C: HEMOGLOBIN A1C: 6.7 % — AB (ref 4.6–6.5)

## 2016-03-13 ENCOUNTER — Ambulatory Visit (INDEPENDENT_AMBULATORY_CARE_PROVIDER_SITE_OTHER): Payer: Medicare Other | Admitting: Primary Care

## 2016-03-13 VITALS — BP 124/80 | HR 64 | Temp 98.0°F | Ht 67.0 in | Wt 228.8 lb

## 2016-03-13 DIAGNOSIS — I10 Essential (primary) hypertension: Secondary | ICD-10-CM | POA: Diagnosis not present

## 2016-03-13 DIAGNOSIS — E079 Disorder of thyroid, unspecified: Secondary | ICD-10-CM

## 2016-03-13 DIAGNOSIS — E119 Type 2 diabetes mellitus without complications: Secondary | ICD-10-CM

## 2016-03-13 LAB — HEMOGLOBIN A1C: Hgb A1c MFr Bld: 7.4 % — ABNORMAL HIGH (ref 4.6–6.5)

## 2016-03-13 MED ORDER — MOMETASONE FUROATE 50 MCG/ACT NA SUSP: 2.0000 | Freq: Every day | NASAL | 5 refills | Status: DC

## 2016-03-13 MED ORDER — GLUCOSE BLOOD VI STRP: ORAL_STRIP | 11 refills | Status: DC

## 2016-03-13 NOTE — Progress Notes (Signed)
Subjective:    Patient ID: Courtney Stone, female    DOB: [birth year], 76 y.o.   MRN: [id number]  HPI  Courtney Stone is a 76 year old female who presents today for follow up.  1) Type 2 Diabetes: Currently managed on Metformin XR 500 mg once daily and Glipizide XL 2.5 mg. Her A1C in November 2017 was 6.7. Her urine microalbumin was negative in January 2017, she is managed on an ACE. Pneumonia vaccinations are up-to-date. She is scheduled in July for her eye exam in July 2018.  She's checking her sugars once weekly before breakfast and will get 120-140's. She's had a few numbers in the 110's. She denies visual changes, dizziness, numbness/tinlging. She is needing a refill of her test strips.  2) Thyroid Disorder: Currently managed by specialist per ENT and will follow annually.   3) Essential Hypertension: Currently managed on Lisinopril/HCTZ. Her BP in the office today is 124/80. She denies chest pain, dizziness, shortness of breath.  Review of Systems  Constitutional: Negative for fatigue.  Eyes: Negative for visual disturbance.  Respiratory: Negative for shortness of breath.   Cardiovascular: Negative for chest pain.  Neurological: Negative for dizziness, numbness and headaches.       Past Medical History:  Diagnosis Date  . Anxiety and depression   . Diabetes mellitus   . HTN (hypertension)    120/80's at home  . Thyroid disorder      Social History   Social History  . Marital status: Married    Spouse name: N/A  . Number of children: N/A  . Years of education: N/A   Occupational History  . Not on file.   Social History Main Topics  . Smoking status: Never Smoker  . Smokeless tobacco: Never Used  . Alcohol use No  . Drug use: No  . Sexual activity: No   Other Topics Concern  . Not on file   Social History Narrative   Married.    2 children, 2 grand children.   Lives in [place].   Retired, once worked as a Runner, broadcasting/film/video.    Enjoys spending time with family,  reading.     Past Surgical History:  Procedure Laterality Date  . ABDOMINAL HYSTERECTOMY  1976   complete  . BREAST BIOPSY Right    neg  . BREAST SURGERY  1968   benign  . CATARACT EXTRACTION, BILATERAL  2016   [facility]   . NECK SURGERY  1993   ?bone spur removal  . URETHRA SURGERY    . WRIST FRACTURE SURGERY      Family History  Problem Relation Age of Onset  . Cancer Mother     lung  . Cancer Father   . COPD Father   . Diabetes Sister   . Hypertension Sister   . Breast cancer Paternal Grandmother 45    Allergies  Allergen Reactions  . Adhesive [Tape]   . Sulfa Antibiotics     Headache  . Latex Rash    Current Outpatient Prescriptions on File Prior to Visit  Medication Sig Dispense Refill  . FLUoxetine (PROZAC) 20 MG tablet take 1 tablet by mouth once daily 90 tablet 3  . glipiZIDE (GLUCOTROL XL) 2.5 MG 24 hr tablet take 1 tablet by mouth once daily WITH BREAKFAST 90 tablet 3  . lisinopril-hydrochlorothiazide (PRINZIDE,ZESTORETIC) 10-12.5 MG tablet take 1 tablet by mouth once daily 90 tablet 3  . metFORMIN (GLUCOPHAGE-XR) 500 MG 24 hr tablet take 1 tablet by  mouth at bedtime 90 tablet 3  . Multiple Vitamins-Minerals (CENTRUM SILVER PO) Take 1 tablet by mouth daily.    . vitamin B-12 (CYANOCOBALAMIN) 250 MCG tablet Take 250 mcg by mouth daily.     No current facility-administered medications on file prior to visit.     BP 124/80   Pulse 64   Temp 98 F (36.7 C) (Oral)   Ht 5\' 7"  (1.702 m)   Wt 228 lb 12.8 oz (103.8 kg)   SpO2 96%   BMI 35.84 kg/m    Objective:   Physical Exam  Constitutional: She appears well-nourished.  Cardiovascular: Normal rate and regular rhythm.   Pulmonary/Chest: Effort normal and breath sounds normal.  Skin: Skin is warm and dry.  Psychiatric: She has a normal mood and affect.          Assessment & Plan:

## 2016-03-13 NOTE — Assessment & Plan Note (Signed)
Stable today, continue current regimen. 

## 2016-03-13 NOTE — Assessment & Plan Note (Signed)
Continues to follow annually with specialist.

## 2016-03-13 NOTE — Assessment & Plan Note (Signed)
Due for repeat A1C today, if stable then check in 6 months. Vaccinations UTD. Eye exam scheduled for July 2018. Foot exam due in August. Managed on ACE. Discuss statin treatment during CPE.

## 2016-03-13 NOTE — Progress Notes (Signed)
Pre visit review using our clinic review tool, if applicable. No additional management support is needed unless otherwise documented below in the visit note. 

## 2016-03-13 NOTE — Patient Instructions (Signed)
Complete lab work prior to leaving today. I will notify you of your results once received.   Please schedule a physical with me in 6 months. You may also schedule a lab only appointment 3-4 days prior. We will discuss your lab results in detail during your physical.  It was a pleasure to see you today!  

## 2016-08-05 ENCOUNTER — Other Ambulatory Visit: Payer: Self-pay | Admitting: Primary Care

## 2016-08-05 DIAGNOSIS — F4323 Adjustment disorder with mixed anxiety and depressed mood: Secondary | ICD-10-CM

## 2016-08-05 NOTE — Telephone Encounter (Signed)
Rx refill sent to pharmacy. She has a follow up visit in late August 2018.

## 2016-08-05 NOTE — Telephone Encounter (Signed)
Ok to refill? Electronically refill request for FLUoxetine (PROZAC) 20 MG tablet  Last prescribed by Dr Dan HumphreysWalker on 06/12/2015. Last seen by Jae DireKate on 03/13/2016

## 2016-09-12 ENCOUNTER — Encounter: Payer: Self-pay | Admitting: Primary Care

## 2016-09-12 ENCOUNTER — Ambulatory Visit (INDEPENDENT_AMBULATORY_CARE_PROVIDER_SITE_OTHER): Payer: Medicare Other | Admitting: Primary Care

## 2016-09-12 DIAGNOSIS — E119 Type 2 diabetes mellitus without complications: Secondary | ICD-10-CM

## 2016-09-12 DIAGNOSIS — I10 Essential (primary) hypertension: Secondary | ICD-10-CM

## 2016-09-12 DIAGNOSIS — Z0000 Encounter for general adult medical examination without abnormal findings: Secondary | ICD-10-CM

## 2016-09-12 DIAGNOSIS — F4323 Adjustment disorder with mixed anxiety and depressed mood: Secondary | ICD-10-CM

## 2016-09-12 DIAGNOSIS — E079 Disorder of thyroid, unspecified: Secondary | ICD-10-CM

## 2016-09-12 LAB — LIPID PANEL
CHOL/HDL RATIO: 3
Cholesterol: 109 mg/dL (ref 0–200)
HDL: 41.9 mg/dL (ref >39.00)
LDL Cholesterol: 54 mg/dL (ref 0–99)
NONHDL: 66.96
Triglycerides: 66 mg/dL (ref 0.0–149.0)
VLDL: 13.2 mg/dL (ref 0.0–40.0)

## 2016-09-12 LAB — COMPREHENSIVE METABOLIC PANEL
ALBUMIN: 3.9 g/dL (ref 3.5–5.2)
ALK PHOS: 69 U/L (ref 39–117)
ALT: 16 U/L (ref 0–35)
AST: 15 U/L (ref 0–37)
BILIRUBIN TOTAL: 1.3 mg/dL — AB (ref 0.2–1.2)
BUN: 18 mg/dL (ref 6–23)
CO2: 31 mEq/L (ref 19–32)
CREATININE: 0.87 mg/dL (ref 0.40–1.20)
Calcium: 9.5 mg/dL (ref 8.4–10.5)
Chloride: 105 mEq/L (ref 96–112)
GFR: 67.29 mL/min (ref >60.00)
GLUCOSE: 147 mg/dL — AB (ref 70–99)
POTASSIUM: 4.2 meq/L (ref 3.5–5.1)
SODIUM: 140 meq/L (ref 135–145)
TOTAL PROTEIN: 6.4 g/dL (ref 6.0–8.3)

## 2016-09-12 LAB — HEMOGLOBIN A1C: Hgb A1c MFr Bld: 7.9 % — ABNORMAL HIGH (ref 4.6–6.5)

## 2016-09-12 LAB — TSH: TSH: 0.08 u[IU]/mL — AB (ref 0.35–4.50)

## 2016-09-12 NOTE — Assessment & Plan Note (Signed)
Due for repeat A1C today. Discussed the importance of a healthy diet and regular exercise in order for weight loss, and to reduce the risk of other medical problems. Eye exam UTD. Pneumonia vaccinations UTD. Managed on Ace.  Check lipids. Continue metformin and Glipizide for now.

## 2016-09-12 NOTE — Assessment & Plan Note (Signed)
Immunizations UTD. Declines colonoscopy given age. Mammogram and Bone Density due in 2019. Discussed the importance of a healthy diet and regular exercise in order for weight loss, and to reduce the risk of other medical problems. Advanced directives in place. All recommendations provided at end of visit. Follow up in 6 months for diabetes check, 1 year for MWV.  I have personally reviewed and have noted: 1. The patient's medical and social history 2. Their use of alcohol, tobacco or illicit drugs 3. Their current medications and supplements 4. The patient's functional ability including ADL's, fall  risks, home safety risks and hearing or visual  impairment. 5. Diet and physical activities 6. Evidence for depression or mood disorder

## 2016-09-12 NOTE — Assessment & Plan Note (Signed)
Check TSH today

## 2016-09-12 NOTE — Patient Instructions (Addendum)
Complete lab work prior to leaving today. I will notify you of your results once received.   Increase consumption of vegetables, fruit, whole grains.  Ensure you are consuming 64 ounces of water daily.  Start exercising. You should be getting 150 minutes of exercise weekly.  We will repeat your mammogram and bone density tests next year.  Follow up in 6 months for re-evaluation or sooner if needed.  It was a pleasure to see you today!

## 2016-09-12 NOTE — Assessment & Plan Note (Signed)
Stable on Prozac, refills provided.

## 2016-09-12 NOTE — Progress Notes (Signed)
Patient ID: Courtney Stone, female   DOB: 1940/07/19, 76 y.o.   MRN: [id number]  HPI: Courtney Stone is a 76 year old female who presents today for MWV.  Past Medical History:  Diagnosis Date  . Anxiety and depression   . Diabetes mellitus   . HTN (hypertension)    120/80's at home  . Thyroid disorder     Current Outpatient Prescriptions  Medication Sig Dispense Refill  . FLUoxetine (PROZAC) 20 MG capsule take 1 capsule by mouth once daily 90 capsule 0  . glipiZIDE (GLUCOTROL XL) 2.5 MG 24 hr tablet take 1 tablet by mouth once daily WITH BREAKFAST 90 tablet 3  . glucose blood (TRUETRACK TEST) test strip Use as instruct to test blood sugar twice a day. 100 each 11  . lisinopril-hydrochlorothiazide (PRINZIDE,ZESTORETIC) 10-12.5 MG tablet take 1 tablet by mouth once daily 90 tablet 3  . metFORMIN (GLUCOPHAGE-XR) 500 MG 24 hr tablet take 1 tablet by mouth at bedtime 90 tablet 3  . mometasone (NASONEX) 50 MCG/ACT nasal spray Place 2 sprays into the nose daily. 17 g 5  . Multiple Vitamins-Minerals (CENTRUM SILVER PO) Take 1 tablet by mouth daily.     No current facility-administered medications for this visit.     Allergies  Allergen Reactions  . Adhesive [Tape]   . Sulfa Antibiotics     Headache  . Latex Rash    Family History  Problem Relation Age of Onset  . Cancer Mother        lung  . Cancer Father   . COPD Father   . Diabetes Sister   . Hypertension Sister   . Breast cancer Paternal Grandmother 21    Social History   Social History  . Marital status: Married    Spouse name: N/A  . Number of children: N/A  . Years of education: N/A   Occupational History  . Not on file.   Social History Main Topics  . Smoking status: Never Smoker  . Smokeless tobacco: Never Used  . Alcohol use No  . Drug use: No  . Sexual activity: No   Other Topics Concern  . Not on file   Social History Narrative   Married.    2 children, 2 grand children.   Lives in [place].    Retired, once worked as a Runner, broadcasting/film/video.    Enjoys spending time with family, reading.     Hospitiliaztions: None  Health Maintenance:    Flu: Completed last season, due this season.  Tetanus: Posey Rea, will check with insurance.  Pneumovax: Completed in 2011  Prevnar: Completed in 2015  Zostavax: Completed at age 73  Bone Density: Osteopenia in 2017  Colonoscopy: Attempted years ago, declines today.  Eye Doctor: Completes regularly, last visit in August 2018.  Dental Exam: Completes annually  Mammogram: Completed in 2017, normal  Pap: Hysterectomy      Providers: Vernona Rieger, PCP; Dr. Judie Grieve, Opthalmology; ENT; Dentist   I have personally reviewed and have noted: 1. The patient's medical and social history 2. Their use of alcohol, tobacco or illicit drugs 3. Their current medications and supplements 4. The patient's functional ability including ADL's, fall risks, home safety  risks and hearing or visual impairment. 5. Diet and physical activities 6. Evidence for depression or mood disorder  Subjective:   Review of Systems:   Constitutional: Denies fever, malaise, fatigue, headache or abrupt weight changes.  HEENT: Denies eye pain, eye redness, ear pain, ringing in the ears, wax buildup,  runny nose, nasal congestion, bloody nose, or sore throat. Respiratory: Denies difficulty breathing, shortness of breath, cough or sputum production.   Cardiovascular: Denies chest pain, chest tightness, palpitations or swelling in the hands or feet.  Gastrointestinal: Denies abdominal pain, bloating, constipation, diarrhea or blood in the stool.  GU: Denies urgency, frequency, pain with urination, burning sensation, blood in urine, odor or discharge. Musculoskeletal: Denies difficulty with gait, muscle pain. She does experience left knee pain and stiffness.  Skin: Denies redness, rashes, lesions or ulcercations.  Neurological: Denies dizziness, difficulty with memory, difficulty with speech  or problems with coordination.  Psychiatric: Denies concerns for anxiety or depression.   No other specific complaints in a complete review of systems (except as listed in HPI above).  Objective:  PE:   BP 136/84   Pulse 66   Temp 98.1 F (36.7 C) (Oral)   Ht 5\' 7"  (1.702 m)   Wt 240 lb 6.4 oz (109 kg)   SpO2 98%   BMI 37.65 kg/m  Wt Readings from Last 3 Encounters:  09/12/16 240 lb 6.4 oz (109 kg)  03/13/16 228 lb 12.8 oz (103.8 kg)  09/05/15 227 lb 8 oz (103.2 kg)    General: Appears their stated age, well developed, well nourished in NAD. Skin: Warm, dry and intact. No rashes, lesions or ulcerations noted. HEENT: Head: normal shape and size; Eyes: sclera white, no icterus, conjunctiva pink, PERRLA and EOMs intact; Ears: Tm's gray and intact, normal light reflex; Nose: mucosa pink and moist, septum midline; Throat/Mouth: Teeth present, mucosa pink and moist, no exudate, lesions or ulcerations noted.  Neck: Normal range of motion. Neck supple, trachea midline. No massses, lumps or thyromegaly present.  Cardiovascular: Normal rate and rhythm. S1,S2 noted.  No murmur, rubs or gallops noted. No JVD or BLE edema. No carotid bruits noted. Pulmonary/Chest: Normal effort and positive vesicular breath sounds. No respiratory distress. No wheezes, rales or ronchi noted.  Abdomen: Soft and nontender. Normal bowel sounds, no bruits noted. No distention or masses noted. Liver, spleen and kidneys non palpable. Musculoskeletal: Slight decrease in ROM to left knee when standing, improved after walking. No signs of joint swelling. No difficulty with gait.  Neurological: Alert and oriented. Cranial nerves II-XII intact. Coordination normal. +DTRs bilaterally. Psychiatric: Mood and affect normal. Behavior is normal. Judgment and thought content normal.    BMET    Component Value Date/Time   NA 142 05/31/2015 0912   K 4.2 05/31/2015 0912   CL 105 05/31/2015 0912   CO2 28 05/31/2015 0912    GLUCOSE 100 (H) 05/31/2015 0912   BUN 17 05/31/2015 0912   CREATININE 0.86 05/31/2015 0912   CALCIUM 9.6 05/31/2015 0912    Lipid Panel     Component Value Date/Time   CHOL 113 02/01/2015 1018   TRIG 104.0 02/01/2015 1018   HDL 42.50 02/01/2015 1018   CHOLHDL 3 02/01/2015 1018   VLDL 20.8 02/01/2015 1018   LDLCALC 50 02/01/2015 1018    CBC    Component Value Date/Time   WBC 8.3 09/28/2014 1126   RBC 4.78 09/28/2014 1126   HGB 14.5 09/28/2014 1126   HCT 43.6 09/28/2014 1126   PLT 142.0 (L) 09/28/2014 1126   MCV 91.3 09/28/2014 1126   MCHC 33.3 09/28/2014 1126   RDW 13.8 09/28/2014 1126   LYMPHSABS 2.4 09/28/2014 1126   MONOABS 0.8 09/28/2014 1126   EOSABS 0.1 09/28/2014 1126   BASOSABS 0.0 09/28/2014 1126    Hgb A1C Lab Results  Component Value Date   HGBA1C 7.4 (H) 03/13/2016      Assessment and Plan:   Medicare Annual Wellness Visit:  Diet: She endorses a healthy diet. Breakfast: Cereal, boiled egg with toast Lunch: Sandwich (home made), veggies Dinner: Restaurants, fast food Snacks: Fruit, cheese and crackers Desserts: Occasionally once weekly Beverages: Water, crystal light Physical activity: Does not regularly exercise Depression/mood screen: Negative Hearing: Intact to whispered voice Visual acuity: Performs annual eye exam  ADLs: Capable Fall risk: None Home safety: Good Cognitive evaluation: Intact to orientation, naming, recall and repetition EOL planning: Adv directives, full code/ I agree  Preventative Medicine: Immunizations UTD. Declines colonoscopy given age. Mammogram and Bone Density due in 2019. Discussed the importance of a healthy diet and regular exercise in order for weight loss, and to reduce the risk of other medical problems. Advanced directives in place. All recommendations provided at end of visit. Follow up in 6 months for diabetes check, 1 year for MWV.   Next appointment: 6 months.

## 2016-09-12 NOTE — Assessment & Plan Note (Signed)
Has not been taking lisinopril-HCTZ given effects of urinary frequency. This has been keeping her from going out running errands. BP today overall stable, but needs lisinopril for renal protection. Stop lisinopril-HCTZ, start lisinopril 10 mg once daily. Will call her for BP readings in 2 weeks.

## 2016-09-13 ENCOUNTER — Other Ambulatory Visit: Payer: Self-pay | Admitting: Primary Care

## 2016-09-13 DIAGNOSIS — E119 Type 2 diabetes mellitus without complications: Secondary | ICD-10-CM

## 2016-09-13 MED ORDER — METFORMIN HCL ER 500 MG PO TB24: 1000.0000 mg | ORAL_TABLET | Freq: Every day | ORAL | 1 refills | Status: DC

## 2016-09-15 ENCOUNTER — Encounter: Payer: Self-pay | Admitting: Primary Care

## 2016-09-15 DIAGNOSIS — R7989 Other specified abnormal findings of blood chemistry: Secondary | ICD-10-CM

## 2016-10-07 ENCOUNTER — Encounter: Payer: Self-pay | Admitting: Primary Care

## 2016-10-08 MED ORDER — LISINOPRIL 10 MG PO TABS: 10.0000 mg | ORAL_TABLET | Freq: Every day | ORAL | 1 refills | Status: DC

## 2016-10-08 MED ORDER — GLIPIZIDE ER 2.5 MG PO TB24: ORAL_TABLET | ORAL | 1 refills | Status: DC

## 2016-11-15 ENCOUNTER — Encounter: Payer: Self-pay | Admitting: Internal Medicine

## 2016-11-15 ENCOUNTER — Ambulatory Visit (INDEPENDENT_AMBULATORY_CARE_PROVIDER_SITE_OTHER): Payer: Medicare Other | Admitting: Internal Medicine

## 2016-11-15 VITALS — BP 130/70 | HR 78 | Ht 67.0 in | Wt 242.0 lb

## 2016-11-15 DIAGNOSIS — E042 Nontoxic multinodular goiter: Secondary | ICD-10-CM

## 2016-11-15 DIAGNOSIS — E0590 Thyrotoxicosis, unspecified without thyrotoxic crisis or storm: Secondary | ICD-10-CM

## 2016-11-15 HISTORY — DX: Nontoxic multinodular goiter: E04.2

## 2016-11-15 LAB — TSH: TSH: 0.4 u[IU]/mL (ref 0.35–4.50)

## 2016-11-15 LAB — T3, FREE: T3 FREE: 3.3 pg/mL (ref 2.3–4.2)

## 2016-11-15 LAB — T4, FREE: FREE T4: 0.76 ng/dL (ref 0.60–1.60)

## 2016-11-15 NOTE — Patient Instructions (Addendum)
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  We will check labs today.  We may need a Thyroid Uptake and Scan.  I will let the results through MyChart.  Please return in 3 months.

## 2016-11-15 NOTE — Progress Notes (Signed)
Patient ID: Courtney HumblesLynda B Graef, female   DOB: [birth year], 76 y.o.   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  I reviewed pt's thyroid tests: Lab Results  Component Value Date   TSH 0.08 (L) 09/12/2016   TSH 0.25 (L) 05/31/2015   TSH 0.32 (L) 09/13/2014   TSH 0.42 06/13/2014   TSH 0.45 12/18/2011   TSH 0.43 09/26/2011   TSH 0.07 (L) 06/21/2011   FREET4 0.78 12/18/2011   FREET4 0.79 09/26/2011    Thyroid U/S (01/2010): MNG. Followed for nodules for last 40 years. Stable.  Pt denies feeling nodules in neck, dysphagia/odynophagia, SOB with lying down; she denies: - fatigue - tremors - palpitations - weight loss - hair loss  But she does mention: - Depression/anxiety - Heat intolerance - Hyper defecation (after meals)  Pt does not have a FH of thyroid ds: mother. No FH of thyroid cancer. No h/o radiation tx to head or neck.  No seaweed or kelp, no recent contrast studies. No steroid use. No herbal supplements. No Biotin use.  Pt. also has a history of diabetes, OSA, HTN.  ROS: Constitutional: + see HPI Eyes: no blurry vision, no xerophthalmia ENT: no sore throat, + see HPI Cardiovascular: no CP/SOB/palpitations/+ leg swelling Respiratory: + cough/no SOB Gastrointestinal: no N/V/+ D/no C Musculoskeletal: no muscle/joint aches Skin: no rashes Neurological: no tremors/numbness/tingling/dizziness Psychiatric: + both; depression/anxiety  Past Medical History:  Diagnosis Date  . Anxiety and depression   . Diabetes mellitus   . HTN (hypertension)    120/80's at home  . Thyroid disorder    Past Surgical History:  Procedure Laterality Date  . ABDOMINAL HYSTERECTOMY  1976   complete  . BREAST BIOPSY Right    neg  . BREAST SURGERY  1968   benign  . CATARACT EXTRACTION, BILATERAL  2016   [facility]   . NECK SURGERY  1993   ?bone spur  removal  . URETHRA SURGERY    . WRIST FRACTURE SURGERY     Social History   Social History Main Topics  . Smoking status: Never Smoker  . Smokeless tobacco: Never Used  . Alcohol use No  . Drug use: No   Social History Narrative   Married.    2 children, 2 grand children.   [place]ives in [place].   Retired, once worked as a Runner, broadcasting/film/videoteacher.    Enjoys spending time with family, reading.    Current Outpatient Prescriptions on File Prior to Visit  Medication Sig Dispense Refill  . FLUoxetine (PROZAC) 20 MG capsule take 1 capsule by mouth once daily 90 capsule 0  . glipiZIDE (GLUCOTROL XL) 2.5 MG 24 hr tablet take 1 tablet by mouth once daily WITH BREAKFAST 90 tablet 1  . glucose blood (TRUETRACK TEST) test strip Use as instruct to test blood sugar twice a day. 100 each 11  . lisinopril (PRINIVIL,ZESTRIL) 10 MG tablet Take 1 tablet (10 mg total) by mouth daily. 90 tablet 1  . metFORMIN (GLUCOPHAGE-XR) 500 MG 24 hr tablet Take 2 tablets (1,000 mg total) by mouth daily with breakfast. 180 tablet 1  . mometasone (NASONEX) 50 MCG/ACT nasal spray Place 2 sprays into the nose daily. 17 g 5  . Multiple Vitamins-Minerals (CENTRUM SILVER PO) Take 1 tablet by mouth daily.     No current facility-administered medications on file prior to visit.  Allergies  Allergen Reactions  . Adhesive [Tape]   . Sulfa Antibiotics     Headache  . Latex Rash   Family History  Problem Relation Age of Onset  . Cancer Mother        lung  . Cancer Father   . COPD Father   . Diabetes Sister   . Hypertension Sister   . Breast cancer Paternal Grandmother 39    PE: BP 130/70 (BP Location: Left Arm, Patient Position: Sitting)   Pulse 78   Ht 5\' 7"  (1.702 m)   Wt 242 lb (109.8 kg)   SpO2 97%   BMI 37.90 kg/m  Wt Readings from Last 3 Encounters:  11/15/16 242 lb (109.8 kg)  09/12/16 240 lb 6.4 oz (109 kg)  03/13/16 228 lb 12.8 oz (103.8 kg)   Constitutional: obese, in NAD Eyes: PERRLA, EOMI, no  exophthalmos, no lid lag, no stare ENT: moist mucous membranes, no thyromegaly, no thyroid bruits, + 1 cm nodule felt in L-anterior lower neck, no cervical lymphadenopathy Cardiovascular: RRR, No MRG Respiratory: CTA B Gastrointestinal: abdomen soft, NT, ND, BS+ Musculoskeletal: no deformities, strength intact in all 4 Skin: moist, warm, no rashes Neurological: no tremor with outstretched hands, DTR normal in all 4  ASSESSMENT: 1. Thyrotoxicosis  2. MNG  PLAN:  1. Patient with a history of low TSH at least since 2013, with few thyrotoxic sxs:  heat intolerance, hyperdefecation, anxiety, but no tremors, palpitations, weight loss.  - she does not appear to have exogenous causes for the low TSH.  - We discussed that possible causes of thyrotoxicosis are:  Graves ds   Thyroiditis toxic multinodular goiter/ toxic adenoma (she does have multinodular goiter on ultrasound). - will check the TSH, fT3 and fT4 and also add thyroid stimulating antibodies to screen for Graves' disease.  - If the tests remain abnormal, we would likely continue by checking a an uptake and scan to differentiate between the 3 above possible etiologies  - we discussed about possible modalities of treatment for the above conditions, to include methimazole use in case of Graves' disease (however, my suspicion is low for this), radioactive iodine ablation or (last resort) surgery. - I do not feel that we need to add beta blockers at this time, since she is not tachycardic or tremulous  - also, since her possible hyper thyroid symptoms are relatively mild, we will not start methimazole at this point. However, I will base my decision also on her TFTs today. - RTC in 3 months, but likely sooner for repeat labs  2. MNG -  patient with a long history of thyroid nodules (40 years), followed closely by ENT, with stability of the nodules over time -  she does not have neck compression symptoms -  one of her nodules palpated in the  anterior neck but has not changed over the years reportedly  - I do not feel we need further investigation for these nodules. She agrees.    Orders Placed This Encounter  Procedures  . TSH  . T4, free  . T3, free  . Thyroid stimulating immunoglobulin   CC: Dr. Bud Face, MD - fax [phone]  Component     Latest Ref Rng & Units 11/15/2016  TSH     0.35 - 4.50 uIU/mL 0.40  T4,Free(Direct)     0.60 - 1.60 ng/dL 0.98  Triiodothyronine,Free,Serum     2.3 - 4.2 pg/mL 3.3  TSI     <140 % baseline <89  All tests normal >> no intervention needed for now but I will repeat her TFTs at next visit and may need a thyroid uptake and scan then.  Carlus Pavlov, MD PhD [facility] Endocrinology

## 2016-11-20 LAB — THYROID STIMULATING IMMUNOGLOBULIN: TSI: <89 % baseline (ref <140)

## 2016-12-04 ENCOUNTER — Other Ambulatory Visit: Payer: Self-pay | Admitting: Primary Care

## 2016-12-04 DIAGNOSIS — F4323 Adjustment disorder with mixed anxiety and depressed mood: Secondary | ICD-10-CM

## 2016-12-05 MED ORDER — FLUOXETINE HCL 20 MG PO CAPS
20.0000 mg | ORAL_CAPSULE | Freq: Every day | ORAL | 2 refills | Status: DC
Start: 2016-12-05 — End: 2017-09-16

## 2016-12-05 NOTE — Telephone Encounter (Signed)
Refill sent to pharmacy.   

## 2016-12-14 ENCOUNTER — Other Ambulatory Visit: Payer: Self-pay | Admitting: Primary Care

## 2016-12-14 DIAGNOSIS — E119 Type 2 diabetes mellitus without complications: Secondary | ICD-10-CM

## 2016-12-17 ENCOUNTER — Other Ambulatory Visit (INDEPENDENT_AMBULATORY_CARE_PROVIDER_SITE_OTHER): Payer: Medicare Other

## 2016-12-17 DIAGNOSIS — E119 Type 2 diabetes mellitus without complications: Secondary | ICD-10-CM | POA: Diagnosis not present

## 2016-12-17 LAB — HEMOGLOBIN A1C: Hgb A1c MFr Bld: 7.9 % — ABNORMAL HIGH (ref 4.6–6.5)

## 2017-02-19 LAB — HM DIABETES EYE EXAM

## 2017-02-20 ENCOUNTER — Ambulatory Visit: Payer: Medicare Other | Admitting: Internal Medicine

## 2017-02-20 ENCOUNTER — Encounter: Payer: Self-pay | Admitting: Internal Medicine

## 2017-02-20 VITALS — BP 150/80 | HR 73 | Ht 67.0 in | Wt 243.6 lb

## 2017-02-20 DIAGNOSIS — E0520 Thyrotoxicosis with toxic multinodular goiter without thyrotoxic crisis or storm: Secondary | ICD-10-CM | POA: Diagnosis not present

## 2017-02-20 LAB — TSH: TSH: 0.36 u[IU]/mL (ref 0.35–4.50)

## 2017-02-20 LAB — T4, FREE: FREE T4: 0.8 ng/dL (ref 0.60–1.60)

## 2017-02-20 LAB — T3, FREE: T3 FREE: 2.9 pg/mL (ref 2.3–4.2)

## 2017-02-20 NOTE — Progress Notes (Signed)
Patient ID: Courtney Stone, female   DOB: 09-Nov-1940, 77 y.o.   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  Last visit 3 months ago.  I reviewed pt's thyroid tests -latest set normal: Lab Results  Component Value Date   TSH 0.40 11/15/2016   TSH 0.08 (L) 09/12/2016   TSH 0.25 (L) 05/31/2015   TSH 0.32 (L) 09/13/2014   TSH 0.42 06/13/2014   TSH 0.45 12/18/2011   TSH 0.43 09/26/2011   TSH 0.07 (L) 06/21/2011   FREET4 0.76 11/15/2016   FREET4 0.78 12/18/2011   FREET4 0.79 09/26/2011    Lab Results  Component Value Date   T3FREE 3.3 11/15/2016   Thyroid U/S (01/2010): MNG. Followed for nodules for last 40 years.  Stable.  Pt denies: - weight loss - heat intolerance - tremors - palpitations - anxiety - hyperdefecation - hair loss  But she does mention: - Depression/anxiety - stable for "many years" - Hyper defecation (after meals)  Pt denies: - feeling nodules in neck - hoarseness - dysphagia - choking - SOB with lying down  Pt does not have a FH of thyroid ds: mother. No FH of thyroid cancer. No h/o radiation tx to head or neck.  No seaweed or kelp. No recent contrast studies. No herbal supplements. No Biotin use. No recent steroids use.   Pt. also has a history of diabetes, OSA, HTN.  ROS: Constitutional: + see HPI: Eyes: no blurry vision, no xerophthalmia ENT: no sore throat,  + see HPI Cardiovascular: no CP/no SOB/no palpitations/no leg swelling Respiratory: no cough/no SOB/no wheezing Gastrointestinal: no N/no V/no D/no C/no acid reflux Musculoskeletal: no muscle aches/no joint aches Skin: no rashes, no hair loss Neurological: no tremors/no numbness/no tingling/no dizziness  I reviewed pt's medications, allergies, PMH, social hx, family hx, and changes were documented in the history of present illness. Otherwise, unchanged  from my initial visit note.  Past Medical History:  Diagnosis Date  . Anxiety and depression   . Diabetes mellitus   . HTN (hypertension)    120/80's at home  . Thyroid disorder    Past Surgical History:  Procedure Laterality Date  . ABDOMINAL HYSTERECTOMY  1976   complete  . BREAST BIOPSY Right    neg  . BREAST SURGERY  1968   benign  . CATARACT EXTRACTION, BILATERAL  2016   [facility]   . NECK SURGERY  1993   ?bone spur removal  . URETHRA SURGERY    . WRIST FRACTURE SURGERY     Social History   Social History Main Topics  . Smoking status: Never Smoker  . Smokeless tobacco: Never Used  . Alcohol use No  . Drug use: No   Social History Narrative   Married.    2 children, 2 grand children.   Lives in [place].   Retired, once worked as a Runner, broadcasting/film/video.    Enjoys spending time with family, reading.    Current Outpatient Medications on File Prior to Visit  Medication Sig Dispense Refill  . FLUoxetine (PROZAC) 20 MG capsule Take 1 capsule (20 mg total) daily by mouth. 90 capsule 2  . glipiZIDE (GLUCOTROL XL) 2.5 MG 24 hr tablet take 1 tablet by mouth once daily WITH BREAKFAST 90 tablet 1  . glucose blood (TRUETRACK TEST) test strip Use as instruct to test blood sugar twice a day. 100 each  11  . lisinopril (PRINIVIL,ZESTRIL) 10 MG tablet Take 1 tablet (10 mg total) by mouth daily. 90 tablet 1  . metFORMIN (GLUCOPHAGE-XR) 500 MG 24 hr tablet Take 2 tablets (1,000 mg total) by mouth daily with breakfast. 180 tablet 1  . mometasone (NASONEX) 50 MCG/ACT nasal spray Place 2 sprays into the nose daily. 17 g 5  . Multiple Vitamins-Minerals (CENTRUM SILVER PO) Take 1 tablet by mouth daily.     No current facility-administered medications on file prior to visit.    Allergies  Allergen Reactions  . Adhesive [Tape]   . Sulfa Antibiotics     Headache  . Latex Rash   Family History  Problem Relation Age of Onset  . Cancer Mother        lung  . Cancer Father   .  COPD Father   . Diabetes Sister   . Hypertension Sister   . Breast cancer Paternal Grandmother [id number]    PE: BP (!) 150/80   Pulse 73   Ht 5\' 7"  (1.702 m)   Wt 243 lb 9.6 oz (110.5 kg)   SpO2 97%   BMI 38.15 kg/m  Wt Readings from Last 3 Encounters:  02/20/17 243 lb 9.6 oz (110.5 kg)  11/15/16 242 lb (109.8 kg)  09/12/16 240 lb 6.4 oz (109 kg)   Constitutional: overweight, in NAD Eyes: PERRLA, EOMI, no exophthalmos ENT: moist mucous membranes, no thyromegaly, no thyroid bruits, +1 cm nodule felt in L-anterior lower neck,no cervical lymphadenopathy Cardiovascular: RRR, No MRG Respiratory: CTA B Gastrointestinal: abdomen soft, NT, ND, BS+ Musculoskeletal: no deformities, strength intact in all 4 Skin: moist, warm, no rashes Neurological: no tremor with outstretched hands, DTR normal in all 4  ASSESSMENT: 1. Thyrotoxicosis  2. MNG  PLAN:  1. Patient with a history of low TSH at least since 2013, with a few thyrotoxic symptoms: Heat intolerance, hyper defecation, anxiety, but no tremors, palpitations, weight loss.  She has a multinodular goiter on ultrasound, possibly toxic goiter, and negative Graves' antibodies.  There were no obvious exogenous causes for the low TSH. At last visit, her TFTs were all normal so we decided to follow her expectantly. - we again discussed about possible etiologies, Graves' disease, toxic multinodular goiter, thyroiditis and the treatments recommended in each case.  - For now, we will repeat her TFTs >>  If worse, we may need a thyroid uptake and scan to differentiate between the above etiologies, but for now, our working diagnosis is toxic multinodular goiter, silent as of last visit. - There is no need to add beta-blockers at this time, as she is not tachycardic or tremulous  - Return in 6 months  2. MNG  - patient with a long history of thyroid nodules (40 years), followed closely by ENT, with stability of the nodules over time - No neck  compression symptoms  - 1 of her nodules is palpated in the anterior neck but has not changed over the years, reportedly - We do not need further investigation for these nodules.  She agrees.  Component     Latest Ref Rng & Units 02/20/2017  TSH     0.35 - 4.50 uIU/mL 0.36  T4,Free(Direct)     0.60 - 1.60 ng/dL [id number]  Triiodothyronine,Free,Serum     2.3 - 4.2 pg/mL 2.9   TFTs remain normal. Carlus Pavlovristina Kindel Rochefort, MD PhD [facility]

## 2017-02-20 NOTE — Patient Instructions (Signed)
Please stop at the lab.  Please come back for a follow-up appointment in 6 months.  

## 2017-02-21 ENCOUNTER — Encounter: Payer: Self-pay | Admitting: Primary Care

## 2017-03-18 ENCOUNTER — Encounter: Payer: Self-pay | Admitting: Primary Care

## 2017-03-18 ENCOUNTER — Ambulatory Visit: Payer: Medicare Other | Admitting: Primary Care

## 2017-03-18 VITALS — BP 126/66 | HR 65 | Temp 98.0°F | Ht 67.0 in | Wt 243.0 lb

## 2017-03-18 DIAGNOSIS — E119 Type 2 diabetes mellitus without complications: Secondary | ICD-10-CM | POA: Diagnosis not present

## 2017-03-18 DIAGNOSIS — I10 Essential (primary) hypertension: Secondary | ICD-10-CM

## 2017-03-18 LAB — HEMOGLOBIN A1C: Hgb A1c MFr Bld: 7.9 % — ABNORMAL HIGH (ref 4.6–6.5)

## 2017-03-18 NOTE — Assessment & Plan Note (Addendum)
Due for repeat A1C today. Consider increasing Glipizide if A1C at or above 8.  Discussed the importance of a healthy diet and regular exercise in order for weight loss, and to reduce the risk of any potential medical problems. Foot exam today unremarkable. Managed on ACE, no statin. Repeat lipids this August 2019. Follow up in 6 months.

## 2017-03-18 NOTE — Assessment & Plan Note (Signed)
Stable in the office today, continue lisinopril 10 mg. Urinary frequency has improved since removal of HCTZ.

## 2017-03-18 NOTE — Patient Instructions (Addendum)
Stop by the lab prior to leaving today. I will notify you of your results once received.   Continue to work on your diet, increase vegetables, whole grains, lean protein.  Ensure you are consuming 64 ounces of water daily.  Start exercising. You should be getting 150 minutes of exercise weekly.  Please schedule a Wellness Visit with our nurse and a physical with me in 6 months.   It was a pleasure to see you today!   Diabetes Mellitus and Nutrition When you have diabetes (diabetes mellitus), it is very important to have healthy eating habits because your blood sugar (glucose) levels are greatly affected by what you eat and drink. Eating healthy foods in the appropriate amounts, at about the same times every day, can help you:  Control your blood glucose.  Lower your risk of heart disease.  Improve your blood pressure.  Reach or maintain a healthy weight.  Every person with diabetes is different, and each person has different needs for a meal plan. Your health care provider may recommend that you work with a diet and nutrition specialist (dietitian) to make a meal plan that is best for you. Your meal plan may vary depending on factors such as:  The calories you need.  The medicines you take.  Your weight.  Your blood glucose, blood pressure, and cholesterol levels.  Your activity level.  Other health conditions you have, such as heart or kidney disease.  How do carbohydrates affect me? Carbohydrates affect your blood glucose level more than any other type of food. Eating carbohydrates naturally increases the amount of glucose in your blood. Carbohydrate counting is a method for keeping track of how many carbohydrates you eat. Counting carbohydrates is important to keep your blood glucose at a healthy level, especially if you use insulin or take certain oral diabetes medicines. It is important to know how many carbohydrates you can safely have in each meal. This is different for  every person. Your dietitian can help you calculate how many carbohydrates you should have at each meal and for snack. Foods that contain carbohydrates include:  Bread, cereal, rice, pasta, and crackers.  Potatoes and corn.  Peas, beans, and lentils.  Milk and yogurt.  Fruit and juice.  Desserts, such as cakes, cookies, ice cream, and candy.  How does alcohol affect me? Alcohol can cause a sudden decrease in blood glucose (hypoglycemia), especially if you use insulin or take certain oral diabetes medicines. Hypoglycemia can be a life-threatening condition. Symptoms of hypoglycemia (sleepiness, dizziness, and confusion) are similar to symptoms of having too much alcohol. If your health care provider says that alcohol is safe for you, follow these guidelines:  Limit alcohol intake to no more than 1 drink per day for nonpregnant women and 2 drinks per day for men. One drink equals 12 oz of beer, 5 oz of wine, or 1 oz of hard liquor.  Do not drink on an empty stomach.  Keep yourself hydrated with water, diet soda, or unsweetened iced tea.  Keep in mind that regular soda, juice, and other mixers may contain a lot of sugar and must be counted as carbohydrates.  What are tips for following this plan? Reading food labels  Start by checking the serving size on the label. The amount of calories, carbohydrates, fats, and other nutrients listed on the label are based on one serving of the food. Many foods contain more than one serving per package.  Check the total grams (g) of carbohydrates  in one serving. You can calculate the number of servings of carbohydrates in one serving by dividing the total carbohydrates by 15. For example, if a food has 30 g of total carbohydrates, it would be equal to 2 servings of carbohydrates.  Check the number of grams (g) of saturated and trans fats in one serving. Choose foods that have low or no amount of these fats.  Check the number of milligrams (mg) of  sodium in one serving. Most people should limit total sodium intake to less than 2,300 mg per day.  Always check the nutrition information of foods labeled as "low-fat" or "nonfat". These foods may be higher in added sugar or refined carbohydrates and should be avoided.  Talk to your dietitian to identify your daily goals for nutrients listed on the label. Shopping  Avoid buying canned, premade, or processed foods. These foods tend to be high in fat, sodium, and added sugar.  Shop around the outside edge of the grocery store. This includes fresh fruits and vegetables, bulk grains, fresh meats, and fresh dairy. Cooking  Use low-heat cooking methods, such as baking, instead of high-heat cooking methods like deep frying.  Cook using healthy oils, such as olive, canola, or sunflower oil.  Avoid cooking with butter, cream, or high-fat meats. Meal planning  Eat meals and snacks regularly, preferably at the same times every day. Avoid going long periods of time without eating.  Eat foods high in fiber, such as fresh fruits, vegetables, beans, and whole grains. Talk to your dietitian about how many servings of carbohydrates you can eat at each meal.  Eat 4-6 ounces of lean protein each day, such as lean meat, chicken, fish, eggs, or tofu. 1 ounce is equal to 1 ounce of meat, chicken, or fish, 1 egg, or 1/4 cup of tofu.  Eat some foods each day that contain healthy fats, such as avocado, nuts, seeds, and fish. Lifestyle   Check your blood glucose regularly.  Exercise at least 30 minutes 5 or more days each week, or as told by your health care provider.  Take medicines as told by your health care provider.  Do not use any products that contain nicotine or tobacco, such as cigarettes and e-cigarettes. If you need help quitting, ask your health care provider.  Work with a Veterinary surgeon or diabetes educator to identify strategies to manage stress and any emotional and social challenges. What are  some questions to ask my health care provider?  Do I need to meet with a diabetes educator?  Do I need to meet with a dietitian?  What number can I call if I have questions?  When are the best times to check my blood glucose? Where to find more information:  American Diabetes Association: diabetes.org/food-and-fitness/food  Academy of Nutrition and Dietetics: https://www.vargas.com/  [employer] of Diabetes and Digestive and Kidney Diseases (NIH): FindJewelers.cz Summary  A healthy meal plan will help you control your blood glucose and maintain a healthy lifestyle.  Working with a diet and nutrition specialist (dietitian) can help you make a meal plan that is best for you.  Keep in mind that carbohydrates and alcohol have immediate effects on your blood glucose levels. It is important to count carbohydrates and to use alcohol carefully. This information is not intended to replace advice given to you by your health care provider. Make sure you discuss any questions you have with your health care provider. Document Released: 10/04/2004 Document Revised: 02/12/2016 Document Reviewed: 02/12/2016 Elsevier Interactive Patient  Education  2018 Elsevier Inc.  

## 2017-03-18 NOTE — Progress Notes (Signed)
Subjective:    Patient ID: Courtney Stone, female    DOB: April 04, 1940, [age] y.o.   MRN: [id number]  HPI  Courtney Stone is a 77 year old female who presents today for follow up.  1) Type 2 Diabetes:   Current medications include: Metformin XR 1000 mg once daily, glipizide XL 2.5 mg  She is checking her blood glucose one times weekly and is getting readings of: AM fasting: Typically 140's.  Highest number: 168 Lowest number: 120's  Last A1C: 7.9 in November 2018 Last Eye Exam: Completed in January 2019 Last Foot Exam:  Pneumonia Vaccination: UTD ACE/ARB: Lisinopril Statin: No statin, Lipids in August 2018 with LDL at goal  Diet currently consists of:  Breakfast: Cereal (shreaded wheat), boiled egg, toast Lunch: Sandwich  Dinner: Fish, chicken, vegetables, potatoes; restaurants 2-3 times weekly Snacks: Cheese and crackers, fruit Desserts: Occasional cookie Beverages: Crystal light, water  Exercise: She is not currently exercising    Review of Systems  Constitutional: Negative for fatigue.  Respiratory: Negative for shortness of breath.   Cardiovascular: Negative for chest pain.  Neurological: Negative for dizziness, numbness and headaches.       Past Medical History:  Diagnosis Date  . Anxiety and depression   . Diabetes mellitus   . HTN (hypertension)    120/80's at home  . Thyroid disorder      Social History   Socioeconomic History  . Marital status: Married    Spouse name: Not on file  . Number of children: Not on file  . Years of education: Not on file  . Highest education level: Not on file  Social Needs  . Financial resource strain: Not on file  . Food insecurity - worry: Not on file  . Food insecurity - inability: Not on file  . Transportation needs - medical: Not on file  . Transportation needs - non-medical: Not on file  Occupational History  . Not on file  Tobacco Use  . Smoking status: Never Smoker  . Smokeless tobacco: Never Used    Substance and Sexual Activity  . Alcohol use: No  . Drug use: No  . Sexual activity: No  Other Topics Concern  . Not on file  Social History Narrative   Married.    2 children, 2 grand children.   Lives in [place].   Retired, once worked as a Runner, broadcasting/film/videoteacher.    Enjoys spending time with family, reading.     Past Surgical History:  Procedure Laterality Date  . ABDOMINAL HYSTERECTOMY  1976   complete  . BREAST BIOPSY Right    neg  . BREAST SURGERY  1968   benign  . CATARACT EXTRACTION, BILATERAL  2016   [facility]   . NECK SURGERY  1993   ?bone spur removal  . URETHRA SURGERY    . WRIST FRACTURE SURGERY      Family History  Problem Relation Age of Onset  . Cancer Mother        lung  . Cancer Father   . COPD Father   . Diabetes Sister   . Hypertension Sister   . Breast cancer Paternal Grandmother [id number]    Allergies  Allergen Reactions  . Adhesive [Tape]   . Sulfa Antibiotics     Headache  . Latex Rash    Current Outpatient Medications on File Prior to Visit  Medication Sig Dispense Refill  . FLUoxetine (PROZAC) 20 MG capsule Take 1 capsule (20 mg total) daily by mouth.  90 capsule 2  . glipiZIDE (GLUCOTROL XL) 2.5 MG 24 hr tablet take 1 tablet by mouth once daily WITH BREAKFAST 90 tablet 1  . glucose blood (TRUETRACK TEST) test strip Use as instruct to test blood sugar twice a day. 100 each 11  . lisinopril (PRINIVIL,ZESTRIL) 10 MG tablet Take 1 tablet (10 mg total) by mouth daily. 90 tablet 1  . metFORMIN (GLUCOPHAGE-XR) 500 MG 24 hr tablet Take 2 tablets (1,000 mg total) by mouth daily with breakfast. 180 tablet 1  . mometasone (NASONEX) 50 MCG/ACT nasal spray Place 2 sprays into the nose daily. 17 g 5  . Multiple Vitamins-Minerals (CENTRUM SILVER PO) Take 1 tablet by mouth daily.     No current facility-administered medications on file prior to visit.     BP 126/66   Pulse 65   Temp 98 F (36.7 C) (Oral)   Ht 5\' 7"  (1.702 m)   Wt 243 lb (110.2  kg)   SpO2 98%   BMI 38.06 kg/m    Objective:   Physical Exam  Constitutional: She appears well-nourished.  Neck: Neck supple.  Cardiovascular: Normal rate and regular rhythm.  Pulmonary/Chest: Effort normal and breath sounds normal.  Skin: Skin is warm and dry.  Psychiatric: She has a normal mood and affect.          Assessment & Plan:

## 2017-03-19 MED ORDER — GLIPIZIDE ER 5 MG PO TB24: 5.0000 mg | ORAL_TABLET | Freq: Every day | ORAL | 0 refills | Status: DC

## 2017-03-23 ENCOUNTER — Other Ambulatory Visit: Payer: Self-pay | Admitting: Primary Care

## 2017-03-23 DIAGNOSIS — E119 Type 2 diabetes mellitus without complications: Secondary | ICD-10-CM

## 2017-06-15 ENCOUNTER — Other Ambulatory Visit: Payer: Self-pay | Admitting: Primary Care

## 2017-06-15 DIAGNOSIS — E119 Type 2 diabetes mellitus without complications: Secondary | ICD-10-CM

## 2017-06-17 NOTE — Telephone Encounter (Signed)
LAST OV: 03/18/17 Last Refill: Glipizide ER , #90 on 03/19/17 Due for recheck/appt scheduled for: 09/16/17  Will refill X #90 only, no additional to carry patient through appointment.    Thanks.

## 2017-08-18 ENCOUNTER — Encounter: Payer: Self-pay | Admitting: Internal Medicine

## 2017-08-18 ENCOUNTER — Ambulatory Visit: Payer: Medicare Other | Admitting: Internal Medicine

## 2017-08-18 VITALS — BP 138/70 | HR 70 | Ht 67.0 in | Wt 245.0 lb

## 2017-08-18 DIAGNOSIS — E042 Nontoxic multinodular goiter: Secondary | ICD-10-CM | POA: Diagnosis not present

## 2017-08-18 DIAGNOSIS — E119 Type 2 diabetes mellitus without complications: Secondary | ICD-10-CM

## 2017-08-18 DIAGNOSIS — E0590 Thyrotoxicosis, unspecified without thyrotoxic crisis or storm: Secondary | ICD-10-CM

## 2017-08-18 LAB — POCT GLYCOSYLATED HEMOGLOBIN (HGB A1C): Hemoglobin A1C: 7.4 % — AB (ref 4.0–5.6)

## 2017-08-18 LAB — TSH: TSH: 0.09 u[IU]/mL — AB (ref 0.35–4.50)

## 2017-08-18 LAB — T3, FREE: T3 FREE: 3.5 pg/mL (ref 2.3–4.2)

## 2017-08-18 LAB — T4, FREE: Free T4: 0.9 ng/dL (ref 0.60–1.60)

## 2017-08-18 NOTE — Patient Instructions (Addendum)
Please stop at the lab.  Please change: - Glipizide XL 5 mg before b'fast - Metformin ER 1000 mg with dinner  Please come back for a follow-up appointment in 1 year.

## 2017-08-18 NOTE — Progress Notes (Addendum)
Patient ID: Courtney Stone, female   DOB: 1940/12/20, 77 y.o.   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  Last visit  6 mo ago.  I reviewed pt's thyroid tests -latest set normal: Lab Results  Component Value Date   TSH 0.36 02/20/2017   TSH 0.40 11/15/2016   TSH 0.08 (L) 09/12/2016   TSH 0.25 (L) 05/31/2015   TSH 0.32 (L) 09/13/2014   TSH 0.42 06/13/2014   TSH 0.45 12/18/2011   TSH 0.43 09/26/2011   TSH 0.07 (L) 06/21/2011   FREET4 0.80 02/20/2017   FREET4 0.76 11/15/2016   FREET4 0.78 12/18/2011   FREET4 0.79 09/26/2011    Lab Results  Component Value Date   T3FREE 2.9 02/20/2017   T3FREE 3.3 11/15/2016   Thyroid U/S (01/2010): MNG. Followed for nodules for last 40 years.   Pt denies: - weight loss (+2 lbs weight gain) - heat intolerance - tremors - palpitations - anxiety I hope is not abnormal I really hold so - hyperdefecation - hair loss  But she does have -Depression/anxiety, which are stable for many years -Hyper defecation after meals  Pt denies: - feeling nodules in neck - hoarseness - dysphagia - choking - SOB with lying down  Pt does not have a FH of thyroid ds: mother. No FH of thyroid cancer. No h/o radiation tx to head or neck.  No seaweed or kelp. No recent contrast studies. No herbal supplements. No Biotin use. No recent steroids use.   Pt. also has a history of diabetes, OSA, HTN.  ROS: Constitutional: no weight gain/no weight loss, no fatigue, no subjective hyperthermia, no subjective hypothermia Eyes: no blurry vision, no xerophthalmia ENT: no sore throat, + see HPI Cardiovascular: no CP/no SOB/no palpitations/no leg swelling Respiratory: no cough/no SOB/no wheezing Gastrointestinal: no N/no V/+ D/no C/no acid reflux Musculoskeletal: no muscle aches/no joint aches Skin: no rashes, no hair  loss Neurological: no tremors/no numbness/no tingling/no dizziness  I reviewed pt's medications, allergies, PMH, social hx, family hx, and changes were documented in the history of present illness. Otherwise, unchanged from my initial visit note.  Past Medical History:  Diagnosis Date  . Anxiety and depression   . Diabetes mellitus   . HTN (hypertension)    120/80's at home  . Thyroid disorder    Past Surgical History:  Procedure Laterality Date  . ABDOMINAL HYSTERECTOMY  1976   complete  . BREAST BIOPSY Right    neg  . BREAST SURGERY  1968   benign  . CATARACT EXTRACTION, BILATERAL  2016   [facility]   . NECK SURGERY  1993   ?bone spur removal  . URETHRA SURGERY    . WRIST FRACTURE SURGERY     Social History   Social History Main Topics  . Smoking status: Never Smoker  . Smokeless tobacco: Never Used  . Alcohol use No  . Drug use: No   Social History Narrative   Married.    2 children, 2 grand children.   Lives in [place].   Retired, once worked as a Runner, broadcasting/film/video.    Enjoys spending time with family, reading.    Current Outpatient Medications on File Prior to Visit  Medication Sig Dispense Refill  . FLUoxetine (PROZAC) 20 MG capsule Take 1 capsule (20 mg total) daily by mouth. 90 capsule 2  . GLIPIZIDE XL 5 MG  24 hr tablet TAKE 1 TABLET(5 MG) BY MOUTH DAILY WITH BREAKFAST 90 tablet 0  . glucose blood (TRUETRACK TEST) test strip Use as instruct to test blood sugar twice a day. 100 each 11  . lisinopril (PRINIVIL,ZESTRIL) 10 MG tablet TAKE 1 TABLET(10 MG) BY MOUTH DAILY 90 tablet 1  . metFORMIN (GLUCOPHAGE-XR) 500 MG 24 hr tablet TAKE 2 TABLETS(1000 MG) BY MOUTH DAILY WITH BREAKFAST 180 tablet 1  . mometasone (NASONEX) 50 MCG/ACT nasal spray Place 2 sprays into the nose daily. 17 g 5  . Multiple Vitamins-Minerals (CENTRUM SILVER PO) Take 1 tablet by mouth daily.     No current facility-administered medications on file prior to visit.    Allergies   Allergen Reactions  . Adhesive [Tape]   . Sulfa Antibiotics     Headache  . Latex Rash   Family History  Problem Relation Age of Onset  . Cancer Mother        lung  . Cancer Father   . COPD Father   . Diabetes Sister   . Hypertension Sister   . Breast cancer Paternal Grandmother 33    PE: BP 138/70   Pulse 70   Ht 5\' 7"  (1.702 m)   Wt 245 lb (111.1 kg)   SpO2 98%   BMI 38.37 kg/m  Wt Readings from Last 3 Encounters:  08/18/17 245 lb (111.1 kg)  03/18/17 243 lb (110.2 kg)  02/20/17 243 lb 9.6 oz (110.5 kg)   Constitutional: overweight, in NAD Eyes: PERRLA, EOMI, no exophthalmos ENT: moist mucous membranes, no thyromegaly, no cervical lymphadenopathy, +1 cm nodule felt in L-anterior lower neck Cardiovascular: RRR, No MRG Respiratory: CTA B Gastrointestinal: abdomen soft, NT, ND, BS+ Musculoskeletal: no deformities, strength intact in all 4 Skin: moist, warm, no rashes Neurological: no tremor with outstretched hands, DTR normal in all 4  ASSESSMENT: 1. Thyrotoxicosis  2. MNG  3. DM2  PLAN:  1. Patient with a history of low TSH at least since 2013, with a few thyrotoxic symptoms: Heat intolerance, hyper defecation, anxiety, but no tremors, palpitations, weight loss.  She has a history of a multinodular goiter on ultrasound, possibly toxic goiter, and negative Graves' antibodies.  There were no obvious exogenous causes for the low TSH.  We are following her expectantly since her TFTs were recently normal -  we did review at last visit and again today about possible causes for thyrotoxicosis to include Graves' disease, toxic adenoma, or thyroiditis -  as of now, her working diagnosis is toxic multinodular goiter, silent - There is no need to add beta-blockers at this time since she is not tachycardic or tremulous - Check TFTs today - Return in 1 year  2. MNG   Patient with long history of thyroid nodules (40 years), followed closely by ENT, with stability of the  nodules over time  No neck compression symptoms  1 of her nodules is palpated in the anterior neck, but has not changed over the years per her report  We do not need further investigation for the nodules, but will continue to follow her clinically.  3. DM2 - managed by PCP - poc HbA1c today (checked by mistake) 7.4% (better) - now taking Glipizide XL at night and Metformin ER in am >> upon her Q'ing about the best way to take them >> will switch them around  Patient Instructions  Please stop at the lab.  Please change: - Glipizide XL 5 mg before b'fast - Metformin ER 1000  mg with dinner  Please come back for a follow-up appointment in 1 year.  Component     Latest Ref Rng & Units 08/18/2017  Hemoglobin A1C     4.0 - 5.6 % 7.4 (A)  TSH     0.35 - 4.50 uIU/mL 0.09 (L)  T4,Free(Direct)     0.60 - 1.60 ng/dL 8.650.90  Triiodothyronine,Free,Serum     2.3 - 4.2 pg/mL 3.5   TSH now suppressed with normal free T4 and free T3 (subclinical hypothyroidism).  I would suggest to obtain a thyroid uptake and scan to clarify the etiology.  No absolute need for treatment is needed since she is asymptomatic, but I would like to repeat her TFTs in 2 months.  Narrative    CLINICAL DATA: Thyrotoxicosis without thyroid storm  EXAM: THYROID SCAN AND UPTAKE - 4 AND 24 HOURS  TECHNIQUE: Following oral administration of I-123 capsule, anterior planar imaging was acquired at 24 hours. Thyroid uptake was calculated with a thyroid probe at 4-6 hours and 24 hours.  RADIOPHARMACEUTICALS: 290 uCi I-123 sodium iodide p.o.  COMPARISON: None  FINDINGS: Small RIGHT thyroid lobe versus LEFT versus cold nodule at inferior pole RIGHT lobe.  No additional areas of increased or decreased tracer localization.  6 hour I-123 uptake = 11.8% (normal 5-20%)  24 hour I-123 uptake = 35.0% (normal 10-30%)  IMPRESSION: Mildly elevated 24 hour radio iodine uptake consistent with hyperthyroidism.  Question  asymmetric thyroid lobes smaller on RIGHT versus cold nodule at inferior pole RIGHT lobe; thyroid ultrasound recommended to exclude inferior pole RIGHT lobe mass.   Electronically Signed By: Ulyses SouthwardMark Boles M.D. On: 09/16/2017 11:26   Mildly elevated I-123 uptake.  She does have several thyroid nodules for many years.  We will continue to follow her clinically for this.  However, for her hypothyroidism, I would suggest hopefully a low-dose RAI treatment for now.  We will discuss with the patient.  If she ends up having diarrhea, I would like to check her TFTs approximately 5 to 6 weeks after the treatment.  Carlus Pavlovristina Keilany Burnette, MD PhD [facility]

## 2017-08-21 ENCOUNTER — Telehealth: Payer: Self-pay

## 2017-08-21 ENCOUNTER — Encounter: Payer: Self-pay | Admitting: Primary Care

## 2017-08-21 NOTE — Telephone Encounter (Signed)
Received a fax from [email or web address] stating that the pt has been approved for thyroid imaging w/bf w/quant meas. CPT code is [phone] [id number] Authorization number is [id number] Approval is good from 08/20/17 until 10/04/17

## 2017-08-22 ENCOUNTER — Encounter: Payer: Self-pay | Admitting: Internal Medicine

## 2017-08-22 ENCOUNTER — Telehealth: Payer: Self-pay

## 2017-08-22 NOTE — Telephone Encounter (Signed)
Pt would like to check on the status of her Thyroid scan, please advise.

## 2017-08-29 NOTE — Telephone Encounter (Signed)
[facility] is aware and will call pt today, No pre-cert needed.

## 2017-09-01 ENCOUNTER — Other Ambulatory Visit: Payer: Self-pay | Admitting: Primary Care

## 2017-09-01 DIAGNOSIS — E119 Type 2 diabetes mellitus without complications: Secondary | ICD-10-CM

## 2017-09-15 ENCOUNTER — Encounter (HOSPITAL_COMMUNITY)
Admission: RE | Admit: 2017-09-15 | Discharge: 2017-09-15 | Disposition: A | Discharge status: Home / Self Care | Payer: Medicare Other | Source: Ambulatory Visit | Attending: Internal Medicine | Admitting: Internal Medicine

## 2017-09-15 ENCOUNTER — Other Ambulatory Visit: Payer: Self-pay | Admitting: Primary Care

## 2017-09-15 DIAGNOSIS — I10 Essential (primary) hypertension: Secondary | ICD-10-CM

## 2017-09-15 DIAGNOSIS — E0590 Thyrotoxicosis, unspecified without thyrotoxic crisis or storm: Secondary | ICD-10-CM | POA: Diagnosis not present

## 2017-09-15 DIAGNOSIS — D696 Thrombocytopenia, unspecified: Secondary | ICD-10-CM

## 2017-09-15 MED ORDER — SODIUM IODIDE I-123 7.4 MBQ CAPS
290.0000 | ORAL_CAPSULE | Freq: Once | ORAL | Status: AC
  Administered 2017-09-15: 290 via ORAL

## 2017-09-16 ENCOUNTER — Ambulatory Visit (INDEPENDENT_AMBULATORY_CARE_PROVIDER_SITE_OTHER): Payer: Medicare Other | Admitting: Primary Care

## 2017-09-16 ENCOUNTER — Ambulatory Visit: Payer: Medicare Other

## 2017-09-16 ENCOUNTER — Ambulatory Visit (INDEPENDENT_AMBULATORY_CARE_PROVIDER_SITE_OTHER): Payer: Medicare Other

## 2017-09-16 ENCOUNTER — Encounter (HOSPITAL_COMMUNITY)
Admission: RE | Admit: 2017-09-16 | Discharge: 2017-09-16 | Disposition: A | Discharge status: Home / Self Care | Payer: Medicare Other | Source: Ambulatory Visit | Attending: Internal Medicine | Admitting: Internal Medicine

## 2017-09-16 VITALS — BP 116/72 | HR 70 | Temp 98.1°F | Ht 66.75 in | Wt 241.5 lb

## 2017-09-16 DIAGNOSIS — Z0000 Encounter for general adult medical examination without abnormal findings: Secondary | ICD-10-CM | POA: Diagnosis not present

## 2017-09-16 DIAGNOSIS — E119 Type 2 diabetes mellitus without complications: Secondary | ICD-10-CM | POA: Diagnosis not present

## 2017-09-16 DIAGNOSIS — J302 Other seasonal allergic rhinitis: Secondary | ICD-10-CM | POA: Diagnosis not present

## 2017-09-16 DIAGNOSIS — F4323 Adjustment disorder with mixed anxiety and depressed mood: Secondary | ICD-10-CM | POA: Diagnosis not present

## 2017-09-16 DIAGNOSIS — I10 Essential (primary) hypertension: Secondary | ICD-10-CM | POA: Diagnosis not present

## 2017-09-16 DIAGNOSIS — D696 Thrombocytopenia, unspecified: Secondary | ICD-10-CM

## 2017-09-16 DIAGNOSIS — Z0001 Encounter for general adult medical examination with abnormal findings: Secondary | ICD-10-CM | POA: Insufficient documentation

## 2017-09-16 DIAGNOSIS — E042 Nontoxic multinodular goiter: Secondary | ICD-10-CM

## 2017-09-16 DIAGNOSIS — B009 Herpesviral infection, unspecified: Secondary | ICD-10-CM

## 2017-09-16 MED ORDER — LISINOPRIL 10 MG PO TABS: ORAL_TABLET | ORAL | 3 refills | Status: DC

## 2017-09-16 MED ORDER — MOMETASONE FUROATE 50 MCG/ACT NA SUSP: 2.0000 | Freq: Every day | NASAL | 5 refills | Status: DC

## 2017-09-16 MED ORDER — METFORMIN HCL ER 500 MG PO TB24: ORAL_TABLET | ORAL | 0 refills | Status: DC

## 2017-09-16 MED ORDER — GLIPIZIDE ER 5 MG PO TB24: ORAL_TABLET | ORAL | 3 refills | Status: DC

## 2017-09-16 MED ORDER — FLUOXETINE HCL 20 MG PO CAPS: 20.0000 mg | ORAL_CAPSULE | Freq: Every day | ORAL | 3 refills | Status: DC

## 2017-09-16 MED ORDER — ACYCLOVIR 5 % EX OINT: TOPICAL_OINTMENT | CUTANEOUS | 0 refills | Status: DC

## 2017-09-16 NOTE — Progress Notes (Signed)
PCP notes:   Health maintenance:  Flu vaccine - addressed Tetanus vaccine - postponed/insurance  Abnormal screenings:   Hearing - failed  Hearing Screening   125Hz  250Hz  500Hz  1000Hz  2000Hz  3000Hz  4000Hz  6000Hz  8000Hz   Right ear:   0 40 40  40    Left ear:   40 40 40  40     Patient concerns:   Acute left hip pain related to doing yard work. Pain scale: 5/10  Patient is requesting refills of medications. Will provide list to PCP at next appt.   Nurse concerns:  None  Next PCP appt:   09/16/17 @ 1440

## 2017-09-16 NOTE — Assessment & Plan Note (Signed)
Doing well on Fluoxetine 20 mg, continue same. Denies SI/HI. 

## 2017-09-16 NOTE — Assessment & Plan Note (Signed)
Repeat CBC pending. 

## 2017-09-16 NOTE — Assessment & Plan Note (Signed)
Immunizations UTD. Declines bone density scan and mammogram. Colonoscopy UTD. Recommended to improve diet with vegetables/fruit/whole grains/lean protein. Start exercising. Exam unremarkable. Labs pending. Follow up in 1 year for CPE.

## 2017-09-16 NOTE — Assessment & Plan Note (Signed)
Following with endocrinology. Recent thyroid uptake scan, results pending.

## 2017-09-16 NOTE — Assessment & Plan Note (Signed)
Recent A1C of 7.4 during endocrinology appointment for whom she follows for thyroid disorder. Discussed to work on diet and regular exercise. Continue Glipizide XL 5 mg and Metformin XR 1000 mg daily.  Managed on ACE and statin. Pneumonia vaccination UTD. Eye exam UTD.  Follow up in 6 months for diabetes check.

## 2017-09-16 NOTE — Assessment & Plan Note (Signed)
Breakouts twice annually on average. Refill provided for Zovirax.

## 2017-09-16 NOTE — Patient Instructions (Addendum)
Stop by the lab prior to leaving today. I will notify you of your results once received.  Start exercising. You should be getting 150 minutes of exercise weekly.  Ensure you are consuming 64 ounces of water daily.  Make sure to eat plenty of vegetables, fruit, whole grains.  Follow up in 6 months for diabetes check.   It was a pleasure to see you today!   Preventive Care 77 Years and Older, Female Preventive care refers to lifestyle choices and visits with your health care provider that can promote health and wellness. What does preventive care include?  A yearly physical exam. This is also called an annual well check.  Dental exams once or twice a year.  Routine eye exams. Ask your health care provider how often you should have your eyes checked.  Personal lifestyle choices, including: ? Daily care of your teeth and gums. ? Regular physical activity. ? Eating a healthy diet. ? Avoiding tobacco and drug use. ? Limiting alcohol use. ? Practicing safe sex. ? Taking low-dose aspirin every day. ? Taking vitamin and mineral supplements as recommended by your health care provider. What happens during an annual well check? The services and screenings done by your health care provider during your annual well check will depend on your age, overall health, lifestyle risk factors, and family history of disease. Counseling Your health care provider may ask you questions about your:  Alcohol use.  Tobacco use.  Drug use.  Emotional well-being.  Home and relationship well-being.  Sexual activity.  Eating habits.  History of falls.  Memory and ability to understand (cognition).  Work and work Statistician.  Reproductive health.  Screening You may have the following tests or measurements:  Height, weight, and BMI.  Blood pressure.  Lipid and cholesterol levels. These may be checked every 5 years, or more frequently if you are over 16 years old.  Skin check.  Lung  cancer screening. You may have this screening every year starting at age 32 if you have a 30-pack-year history of smoking and currently smoke or have quit within the past 15 years.  Fecal occult blood test (FOBT) of the stool. You may have this test every year starting at age 38.  Flexible sigmoidoscopy or colonoscopy. You may have a sigmoidoscopy every 5 years or a colonoscopy every 10 years starting at age 63.  Hepatitis C blood test.  Hepatitis B blood test.  Sexually transmitted disease (STD) testing.  Diabetes screening. This is done by checking your blood sugar (glucose) after you have not eaten for a while (fasting). You may have this done every 1-3 years.  Bone density scan. This is done to screen for osteoporosis. You may have this done starting at age 42.  Mammogram. This may be done every 1-2 years. Talk to your health care provider about how often you should have regular mammograms.  Talk with your health care provider about your test results, treatment options, and if necessary, the need for more tests. Vaccines Your health care provider may recommend certain vaccines, such as:  Influenza vaccine. This is recommended every year.  Tetanus, diphtheria, and acellular pertussis (Tdap, Td) vaccine. You may need a Td booster every 10 years.  Varicella vaccine. You may need this if you have not been vaccinated.  Zoster vaccine. You may need this after age 69.  Measles, mumps, and rubella (MMR) vaccine. You may need at least one dose of MMR if you were born in 1957 or later. You  may also need a second dose.  Pneumococcal 13-valent conjugate (PCV13) vaccine. One dose is recommended after age 39.  Pneumococcal polysaccharide (PPSV23) vaccine. One dose is recommended after age 65.  Meningococcal vaccine. You may need this if you have certain conditions.  Hepatitis A vaccine. You may need this if you have certain conditions or if you travel or work in places where you may be  exposed to hepatitis A.  Hepatitis B vaccine. You may need this if you have certain conditions or if you travel or work in places where you may be exposed to hepatitis B.  Haemophilus influenzae type b (Hib) vaccine. You may need this if you have certain conditions.  Talk to your health care provider about which screenings and vaccines you need and how often you need them. This information is not intended to replace advice given to you by your health care provider. Make sure you discuss any questions you have with your health care provider. Document Released: 02/03/2015 Document Revised: 09/27/2015 Document Reviewed: 11/08/2014 Elsevier Interactive Patient Education  [employer].

## 2017-09-16 NOTE — Assessment & Plan Note (Signed)
Stable in the office today, continue current regimen. 

## 2017-09-16 NOTE — Progress Notes (Signed)
Subjective:    Patient ID: Courtney Stone, female    DOB: [birth year], 77 y.o.   MRN: [id number]  HPI  [name] is a 77 year old female who presents today for complete physical.  Immunizations: -Influenza: Due this season -Pneumonia: Completed Prevnar 2015, Pneumovax in 2011 -Shingles: Completed in 2011  Diet: She endorses a fair diet. Breakfast: Cereal Lunch: Sandwich Dinner: Meat, vegetable Snacks: Crackers with cheese, fruit Desserts: None Beverages: Water, crystal light  Exercise: She is not exercising Eye exam: Completed in January 2019 Dental exam: Completes semi-annually  Colonoscopy: Completed in 2016 Dexa: Completed in 2 years, declines today. Mammogram: Completed in 2017, declines today.  BP Readings from Last 3 Encounters:  09/16/17 116/72  09/16/17 116/72  08/18/17 138/70     Review of Systems  Constitutional: Negative for unexpected weight change.  HENT: Negative for rhinorrhea.   Respiratory: Negative for cough and shortness of breath.   Cardiovascular: Negative for chest pain.  Gastrointestinal: Negative for constipation and diarrhea.  Genitourinary: Negative for difficulty urinating.  Musculoskeletal: Negative for arthralgias and myalgias.  Skin: Negative for rash.  Allergic/Immunologic: Negative for environmental allergies.  Neurological: Negative for dizziness, numbness and headaches.  Psychiatric/Behavioral: Negative for suicidal ideas.       Doing well on fluoxetine.        Past Medical History:  Diagnosis Date  . Anxiety and depression   . Diabetes mellitus   . HTN (hypertension)    120/80's at home  . Thyroid disorder      Social History   Socioeconomic History  . Marital status: Married    Spouse name: Not on file  . Number of children: Not on file  . Years of education: Not on file  . Highest education level: Not on file  Occupational History  . Not on file  Social Needs  . Financial resource strain: Not on file  .  Food insecurity:    Worry: Not on file    Inability: Not on file  . Transportation needs:    Medical: Not on file    Non-medical: Not on file  Tobacco Use  . Smoking status: Never Smoker  . Smokeless tobacco: Never Used  Substance and Sexual Activity  . Alcohol use: No  . Drug use: No  . Sexual activity: Never  Lifestyle  . Physical activity:    Days per week: Not on file    Minutes per session: Not on file  . Stress: Not on file  Relationships  . Social connections:    Talks on phone: Not on file    Gets together: Not on file    Attends religious service: Not on file    Active member of club or organization: Not on file    Attends meetings of clubs or organizations: Not on file    Relationship status: Not on file  . Intimate partner violence:    Fear of current or ex partner: Not on file    Emotionally abused: Not on file    Physically abused: Not on file    Forced sexual activity: Not on file  Other Topics Concern  . Not on file  Social History Narrative   Married.    2 children, 2 grand children.   [place]   Retired, once worked as a Runner, broadcasting/film/videoteacher.    Enjoys spending time with family, reading.     Past Surgical History:  Procedure Laterality Date  . ABDOMINAL HYSTERECTOMY  1976  complete  . BREAST BIOPSY Right    neg  . BREAST SURGERY  1968   benign  . CATARACT EXTRACTION, BILATERAL  2016   [facility]   . NECK SURGERY  1993   ?bone spur removal  . URETHRA SURGERY    . WRIST FRACTURE SURGERY      Family History  Problem Relation Age of Onset  . Cancer Mother        lung  . Cancer Father   . COPD Father   . Diabetes Sister   . Hypertension Sister   . Breast cancer Paternal Grandmother 50    Allergies  Allergen Reactions  . Adhesive [Tape]   . Sulfa Antibiotics     Headache  . Latex Rash    Current Outpatient Medications on File Prior to Visit  Medication Sig Dispense Refill  . glucose blood (TRUETRACK TEST) test strip  Use as instruct to test blood sugar twice a day. 100 each 11  . Multiple Vitamins-Minerals (CENTRUM SILVER PO) Take 1 tablet by mouth daily.     No current facility-administered medications on file prior to visit.     BP 116/72 (BP Location: Right Arm, Patient Position: Sitting, Cuff Size: Normal)   Pulse 70   Temp 98.1 F (36.7 C) (Oral)   Ht 5' 6.75" (1.695 m) Comment: shoes  Wt 241 lb 8 oz (109.5 kg)   SpO2 97%   BMI 38.11 kg/m    Objective:   Physical Exam  Constitutional: She is oriented to person, place, and time. She appears well-nourished.  HENT:  Mouth/Throat: No oropharyngeal exudate.  Eyes: Pupils are equal, round, and reactive to light. EOM are normal.  Neck: Neck supple.  Cardiovascular: Normal rate and regular rhythm.  Respiratory: Effort normal and breath sounds normal.  GI: Soft. Bowel sounds are normal. There is no tenderness.  Musculoskeletal: Normal range of motion.  Neurological: She is alert and oriented to person, place, and time.  Skin: Skin is warm and dry.  Psychiatric: She has a normal mood and affect.           Assessment & Plan:

## 2017-09-16 NOTE — Progress Notes (Signed)
Subjective:   Courtney Stone is a 77 y.o. female who presents for Medicare Annual (Subsequent) preventive examination.  Review of Systems:  N/A Cardiac Risk Factors include: advanced age (>60men, >5 women);obesity (BMI >30kg/m2);diabetes mellitus;hypertension     Objective:     Vitals: BP 116/72 (BP Location: Right Arm, Patient Position: Sitting, Cuff Size: Normal)   Pulse 70   Temp 98.1 F (36.7 C) (Oral)   Ht 5' 6.75" (1.695 m) Comment: shoes  Wt 241 lb 8 oz (109.5 kg)   SpO2 97%   BMI 38.11 kg/m   Body mass index is 38.11 kg/m.  Advanced Directives 09/16/2017 09/05/2015  Does Patient Have a Medical Advance Directive? Yes Yes  Type of Estate agent of [employer] Power of [place];Living will  Does patient want to make changes to medical advance directive? - No - Patient declined  Copy of Healthcare Power of Attorney in Chart? No - copy requested -    Tobacco Social History   Tobacco Use  Smoking Status Never Smoker  Smokeless Tobacco Never Used     Counseling given: No   Clinical Intake:  Pre-visit preparation completed: Yes  Pain Score: 5      Nutritional Status: BMI > 30  Obese Nutritional Risks: None Diabetes: Yes CBG done?: No Did pt. bring in CBG monitor from home?: No  How often do you need to have someone help you when you read instructions, pamphlets, or other written materials from your doctor or pharmacy?: 1 - Never What is the last grade level you completed in school?: Masters degree  Interpreter Needed?: No  Comments: pt lives with spouse Information entered by :: LPinson, LPN  Past Medical History:  Diagnosis Date  . Anxiety and depression   . Diabetes mellitus   . HTN (hypertension)    120/80's at home  . Thyroid disorder    Past Surgical History:  Procedure Laterality Date  . ABDOMINAL HYSTERECTOMY  1976   complete  . BREAST BIOPSY Right    neg  . BREAST SURGERY  1968   benign  . CATARACT  EXTRACTION, BILATERAL  2016   [facility]   . NECK SURGERY  1993   ?bone spur removal  . URETHRA SURGERY    . WRIST FRACTURE SURGERY     Family History  Problem Relation Age of Onset  . Cancer Mother        lung  . Cancer Father   . COPD Father   . Diabetes Sister   . Hypertension Sister   . Breast cancer Paternal Grandmother 59   Social History   Socioeconomic History  . Marital status: Married    Spouse name: Not on file  . Number of children: Not on file  . Years of education: Not on file  . Highest education level: Not on file  Occupational History  . Not on file  Social Needs  . Financial resource strain: Not on file  . Food insecurity:    Worry: Not on file    Inability: Not on file  . Transportation needs:    Medical: Not on file    Non-medical: Not on file  Tobacco Use  . Smoking status: Never Smoker  . Smokeless tobacco: Never Used  Substance and Sexual Activity  . Alcohol use: No  . Drug use: No  . Sexual activity: Never  Lifestyle  . Physical activity:    Days per week: Not on file    Minutes per  session: Not on file  . Stress: Not on file  Relationships  . Social connections:    Talks on phone: Not on file    Gets together: Not on file    Attends religious service: Not on file    Active member of club or organization: Not on file    Attends meetings of clubs or organizations: Not on file    Relationship status: Not on file  Other Topics Concern  . Not on file  Social History Narrative   Married.    2 children, 2 grand children.   Lives in [place].   Retired, once worked as a Runner, broadcasting/film/videoteacher.    Enjoys spending time with family, reading.     Outpatient Encounter Medications as of 09/16/2017  Medication Sig  . FLUoxetine (PROZAC) 20 MG capsule Take 1 capsule (20 mg total) daily by mouth.  Marland Kitchen. GLIPIZIDE XL 5 MG 24 hr tablet TAKE 1 TABLET(5 MG) BY MOUTH DAILY WITH BREAKFAST  . glucose blood (TRUETRACK TEST) test strip Use as instruct to  test blood sugar twice a day.  . lisinopril (PRINIVIL,ZESTRIL) 10 MG tablet TAKE 1 TABLET(10 MG) BY MOUTH DAILY  . metFORMIN (GLUCOPHAGE-XR) 500 MG 24 hr tablet TAKE 2 TABLETS(1000 MG) BY MOUTH DAILY WITH BREAKFAST  . mometasone (NASONEX) 50 MCG/ACT nasal spray Place 2 sprays into the nose daily.  . Multiple Vitamins-Minerals (CENTRUM SILVER PO) Take 1 tablet by mouth daily.   No facility-administered encounter medications on file as of 09/16/2017.     Activities of Daily Living In your present state of health, do you have any difficulty performing the following activities: 09/16/2017  Hearing? N  Vision? N  Difficulty concentrating or making decisions? N  Walking or climbing stairs? Y  Dressing or bathing? N  Doing errands, shopping? N  Preparing Food and eating ? N  Using the Toilet? N  In the past six months, have you accidently leaked urine? N  Do you have problems with loss of bowel control? N  Managing your Medications? N  Managing your Finances? N  Housekeeping or managing your Housekeeping? N  Some recent data might be hidden    Patient Care Team: Doreene Nestlark, Katherine K, NP as PCP - General (Internal Medicine) Rudi RummageBryan, James Alexander III, MD as Referring Physician (Ophthalmology)    Assessment:   This is a routine wellness examination for Courtney Stone.   Hearing Screening   125Hz  250Hz  500Hz  1000Hz  2000Hz  3000Hz  4000Hz  6000Hz  8000Hz   Right ear:   0 40 40  40    Left ear:   40 40 40  40    Vision Screening Comments: Vision exam in Jan 2019 with Dr. Merdis DelayJames Bryan/[facility]    Exercise Activities and Dietary recommendations Current Exercise Habits: Home exercise routine, Type of exercise: Other - see comments(chair exercises), Time (Minutes): 20, Frequency (Times/Week): 3, Weekly Exercise (Minutes/Week): 60, Intensity: Mild, Exercise limited by: None identified  Goals    . Increase physical activity     Starting 09/16/2017, I will continue to do chair exercises for 20  minutes 3 days per week.        Fall Risk Fall Risk  09/16/2017 09/12/2016 09/05/2015 03/14/2014 02/05/2013  Falls in the past year? No No Yes No No  Comment - - pt fell off sidewalk and broke a bone in elbow - -  Number falls in past yr: - - 1 - -  Injury with Fall? - - Yes - -  Risk for fall due to : - -  History of fall(s) - -  Follow up - - Falls evaluation completed;Falls prevention discussed - -   Depression Screen PHQ 2/9 Scores 09/16/2017 09/12/2016 09/05/2015 03/14/2014  PHQ - 2 Score 0 0 0 0  PHQ- 9 Score 0 - - -     Cognitive Function MMSE - Mini Mental State Exam 09/16/2017 09/05/2015  Orientation to time 5 5  Orientation to Place 5 5  Registration 3 3  Attention/ Calculation 0 0  Recall 3 3  Language- name 2 objects 0 0  Language- repeat 1 1  Language- follow 3 step command 3 3  Language- read & follow direction 0 0  Write a sentence 0 0  Copy design 0 0  Total score 20 20     PLEASE NOTE: A Mini-Cog screen was completed. Maximum score is 20. A value of 0 denotes this part of Folstein MMSE was not completed or the patient failed this part of the Mini-Cog screening.   Mini-Cog Screening Orientation to Time - Max 5 pts Orientation to Place - Max 5 pts Registration - Max 3 pts Recall - Max 3 pts Language Repeat - Max 1 pts Language Follow 3 Step Command - Max 3 pts     Immunization History  Administered Date(s) Administered  . Influenza Split 09/30/2011  . Influenza,inj,Quad PF,6+ Mos 10/21/2012, 10/16/2013, 10/26/2014  . Influenza-Unspecified 11/05/2015  . Pneumococcal Conjugate-13 05/14/2013  . Pneumococcal Polysaccharide-23 06/20/2009  . Zoster 06/20/2009    Screening Tests Health Maintenance  Topic Date Due  . INFLUENZA VACCINE  04/22/2018 (Originally 08/21/2017)  . TETANUS/TDAP  09/17/2018 (Originally 09/19/1959)  . HEMOGLOBIN A1C  02/18/2018  . OPHTHALMOLOGY EXAM  02/19/2018  . FOOT EXAM  03/18/2018  . DEXA SCAN  Completed  . PNA vac Low Risk Adult   Completed     Plan:     I have personally reviewed, addressed, and noted the following in the patient's chart:  A. Medical and social history B. Use of alcohol, tobacco or illicit drugs  C. Current medications and supplements D. Functional ability and status E.  Nutritional status F.  Physical activity G. Advance directives H. List of other physicians I.  Hospitalizations, surgeries, and ER visits in previous 12 months J.  Vitals K. Screenings to include hearing, vision, cognitive, depression L. Referrals and appointments - none  In addition, I have reviewed and discussed with patient certain preventive protocols, quality metrics, and best practice recommendations. A written personalized care plan for preventive services as well as general preventive health recommendations were provided to patient.  See attached scanned questionnaire for additional information.   Signed,   Randa Evens, MHA, BS, LPN Health Coach

## 2017-09-16 NOTE — Patient Instructions (Signed)
[name] , Thank you for taking time to come for your Medicare Wellness Visit. I appreciate your ongoing commitment to your health goals. Please review the following plan we discussed and let me know if I can assist you in the future.   These are the goals we discussed: Goals    . Increase physical activity     Starting 09/16/2017, I will continue to do chair exercises for 20 minutes 3 days per week.        This is a list of the screening recommended for you and due dates:  Health Maintenance  Topic Date Due  . Flu Shot  04/22/2018*  . Tetanus Vaccine  09/17/2018*  . Hemoglobin A1C  02/18/2018  . Eye exam for diabetics  02/19/2018  . Complete foot exam   03/18/2018  . DEXA scan (bone density measurement)  Completed  . Pneumonia vaccines  Completed  *Topic was postponed. The date shown is not the original due date.   Preventive Care for Adults  A healthy lifestyle and preventive care can promote health and wellness. Preventive health guidelines for adults include the following key practices.  . A routine yearly physical is a good way to check with your health care provider about your health and preventive screening. It is a chance to share any concerns and updates on your health and to receive a thorough exam.  . Visit your dentist for a routine exam and preventive care every 6 months. Brush your teeth twice a day and floss once a day. Good oral hygiene prevents tooth decay and gum disease.  . The frequency of eye exams is based on your age, health, family medical history, use  of contact lenses, and other factors. Follow your health care provider's recommendations for frequency of eye exams.  . Eat a healthy diet. Foods like vegetables, fruits, whole grains, low-fat dairy products, and lean protein foods contain the nutrients you need without too many calories. Decrease your intake of foods high in solid fats, added sugars, and salt. Eat the right amount of calories for you. Get  information about a proper diet from your health care provider, if necessary.  . Regular physical exercise is one of the most important things you can do for your health. Most adults should get at least 150 minutes of moderate-intensity exercise (any activity that increases your heart rate and causes you to sweat) each week. In addition, most adults need muscle-strengthening exercises on 2 or more days a week.  Silver Sneakers may be a benefit available to you. To determine eligibility, you may visit the website: www.silversneakers.com or contact program at [phone] Mon-Fri between 8AM-8PM.   . Maintain a healthy weight. The body mass index (BMI) is a screening tool to identify possible weight problems. It provides an estimate of body fat based on height and weight. Your health care provider can find your BMI and can help you achieve or maintain a healthy weight.   For adults 20 years and older: ? A BMI below 18.5 is considered underweight. ? A BMI of 18.5 to 24.9 is normal. ? A BMI of 25 to 29.9 is considered overweight. ? A BMI of 30 and above is considered obese.   . Maintain normal blood lipids and cholesterol levels by exercising and minimizing your intake of saturated fat. Eat a balanced diet with plenty of fruit and vegetables. Blood tests for lipids and cholesterol should begin at age 77 and be repeated every 5 years. If your lipid  or cholesterol levels are high, you are over 50, or you are at high risk for heart disease, you may need your cholesterol levels checked more frequently. Ongoing high lipid and cholesterol levels should be treated with medicines if diet and exercise are not working.  . If you smoke, find out from your health care provider how to quit. If you do not use tobacco, please do not start.  . If you choose to drink alcohol, please do not consume more than 2 drinks per day. One drink is considered to be 12 ounces (355 mL) of beer, 5 ounces (148 mL) of wine, or 1.5  ounces (44 mL) of liquor.  . If you are 58-58 years old, ask your health care provider if you should take aspirin to prevent strokes.  . Use sunscreen. Apply sunscreen liberally and repeatedly throughout the day. You should seek shade when your shadow is shorter than you. Protect yourself by wearing long sleeves, pants, a wide-brimmed hat, and sunglasses year round, whenever you are outdoors.  . Once a month, do a whole body skin exam, using a mirror to look at the skin on your back. Tell your health care provider of new moles, moles that have irregular borders, moles that are larger than a pencil eraser, or moles that have changed in shape or color.

## 2017-09-17 LAB — COMPREHENSIVE METABOLIC PANEL
ALBUMIN: 4.1 g/dL (ref 3.5–5.2)
ALT: 18 U/L (ref 0–35)
AST: 20 U/L (ref 0–37)
Alkaline Phosphatase: 62 U/L (ref 39–117)
BUN: 18 mg/dL (ref 6–23)
CHLORIDE: 104 meq/L (ref 96–112)
CO2: 27 mEq/L (ref 19–32)
CREATININE: 0.94 mg/dL (ref 0.40–1.20)
Calcium: 9.9 mg/dL (ref 8.4–10.5)
GFR: 61.37 mL/min (ref >60.00)
Glucose, Bld: 117 mg/dL — ABNORMAL HIGH (ref 70–99)
Potassium: 4.1 mEq/L (ref 3.5–5.1)
SODIUM: 139 meq/L (ref 135–145)
TOTAL PROTEIN: 6.6 g/dL (ref 6.0–8.3)
Total Bilirubin: 0.8 mg/dL (ref 0.2–1.2)

## 2017-09-17 LAB — CBC
HCT: 42.6 % (ref 36.0–46.0)
Hemoglobin: 14.4 g/dL (ref 12.0–15.0)
MCHC: 33.9 g/dL (ref 30.0–36.0)
MCV: 91.8 fl (ref 78.0–100.0)
Platelets: 153 10*3/uL (ref 150.0–400.0)
RBC: 4.64 Mil/uL (ref 3.87–5.11)
RDW: 13.9 % (ref 11.5–15.5)
WBC: 7.7 10*3/uL (ref 4.0–10.5)

## 2017-09-17 LAB — LIPID PANEL
CHOL/HDL RATIO: 2
CHOLESTEROL: 108 mg/dL (ref 0–200)
HDL: 44.3 mg/dL (ref >39.00)
LDL CALC: 43 mg/dL (ref 0–99)
NonHDL: 63.51
Triglycerides: 103 mg/dL (ref 0.0–149.0)
VLDL: 20.6 mg/dL (ref 0.0–40.0)

## 2017-09-20 NOTE — Progress Notes (Signed)
I reviewed health advisor's note, was available for consultation, and agree with documentation and plan.  

## 2017-11-27 ENCOUNTER — Other Ambulatory Visit: Payer: Self-pay | Admitting: Primary Care

## 2017-11-27 DIAGNOSIS — E119 Type 2 diabetes mellitus without complications: Secondary | ICD-10-CM

## 2017-12-02 ENCOUNTER — Other Ambulatory Visit: Payer: Self-pay | Admitting: Primary Care

## 2018-03-19 ENCOUNTER — Encounter: Payer: Self-pay | Admitting: Primary Care

## 2018-03-19 ENCOUNTER — Ambulatory Visit: Payer: Medicare Other | Admitting: Primary Care

## 2018-03-19 VITALS — BP 130/82 | HR 77 | Temp 98.0°F | Ht 66.75 in | Wt 246.2 lb

## 2018-03-19 DIAGNOSIS — E119 Type 2 diabetes mellitus without complications: Secondary | ICD-10-CM

## 2018-03-19 DIAGNOSIS — Z23 Encounter for immunization: Secondary | ICD-10-CM

## 2018-03-19 LAB — POCT GLYCOSYLATED HEMOGLOBIN (HGB A1C): Hemoglobin A1C: 8.1 % — AB (ref 4.0–5.6)

## 2018-03-19 MED ORDER — GLIPIZIDE ER 10 MG PO TB24: 10.0000 mg | ORAL_TABLET | Freq: Every day | ORAL | 3 refills | Status: DC

## 2018-03-19 NOTE — Progress Notes (Signed)
Subjective:    Patient ID: Courtney Stone, female    DOB: [birth year], 78 y.o.   MRN: [id number]  HPI  Courtney Stone is a 78 year old female who presents today for follow up of diabetes.  Current medications include: Metformin ER 1000 mg daily, Glipizide XL 5 mg daily.  She is checking her blood glucose once weekly and is getting readings of: AM fasting: 140-170  Highest reading: 200 Lowest reading: 127  Last A1C: 7.4 in July 2019, 8.1 today Last Eye Exam: Completed in February 2020 Last Foot Exam: Due today  Pneumonia Vaccination: Completed in 2015, due today ACE/ARB: Lisinopril  Statin: None. LDL of 43 in July 2019  Diet currently consists of:  Breakfast: Cereal with fruit Lunch: Sandwich Dinner: Meat, vegetable, starch Snacks: Crackers with cheese, fruit, peanut butter Desserts: Once weekly  Beverages: Water, crystal light, coffee, cranberry juice  Exercise: She is not exercising    BP Readings from Last 3 Encounters:  03/19/18 130/82  09/16/17 116/72  09/16/17 116/72       Review of Systems  Respiratory: Negative for shortness of breath.   Cardiovascular: Negative for chest pain.  Neurological: Negative for dizziness and numbness.       Past Medical History:  Diagnosis Date  . Anxiety and depression   . Diabetes mellitus   . HTN (hypertension)    120/80's at home  . Thyroid disorder      Social History   Socioeconomic History  . Marital status: Married    Spouse name: Not on file  . Number of children: Not on file  . Years of education: Not on file  . Highest education level: Not on file  Occupational History  . Not on file  Social Needs  . Financial resource strain: Not on file  . Food insecurity:    Worry: Not on file    Inability: Not on file  . Transportation needs:    Medical: Not on file    Non-medical: Not on file  Tobacco Use  . Smoking status: Never Smoker  . Smokeless tobacco: Never Used  Substance and Sexual Activity  .  Alcohol use: No  . Drug use: No  . Sexual activity: Never  Lifestyle  . Physical activity:    Days per week: Not on file    Minutes per session: Not on file  . Stress: Not on file  Relationships  . Social connections:    Talks on phone: Not on file    Gets together: Not on file    Attends religious service: Not on file    Active member of club or organization: Not on file    Attends meetings of clubs or organizations: Not on file    Relationship status: Not on file  . Intimate partner violence:    Fear of current or ex partner: Not on file    Emotionally abused: Not on file    Physically abused: Not on file    Forced sexual activity: Not on file  Other Topics Concern  . Not on file  Social History Narrative   Married.    2 children, 2 grand children.   Lives in [place].   Retired, once worked as a Runner, broadcasting/film/video.    Enjoys spending time with family, reading.     Past Surgical History:  Procedure Laterality Date  . ABDOMINAL HYSTERECTOMY  1976   complete  . BREAST BIOPSY Right    neg  . BREAST SURGERY  1968  benign  . CATARACT EXTRACTION, BILATERAL  2016   [facility]   . NECK SURGERY  1993   ?bone spur removal  . URETHRA SURGERY    . WRIST FRACTURE SURGERY      Family History  Problem Relation Age of Onset  . Cancer Mother        lung  . Cancer Father   . COPD Father   . Diabetes Sister   . Hypertension Sister   . Breast cancer Paternal Grandmother 46    Allergies  Allergen Reactions  . Adhesive [Tape]   . Sulfa Antibiotics     Headache  . Latex Rash    Current Outpatient Medications on File Prior to Visit  Medication Sig Dispense Refill  . acyclovir ointment (ZOVIRAX) 5 % Apply topically five times daily for three days as needed for cold sore. 5 g 0  . FLUoxetine (PROZAC) 20 MG capsule Take 1 capsule (20 mg total) by mouth daily. 90 capsule 3  . glipiZIDE (GLIPIZIDE XL) 5 MG 24 hr tablet Take 1 tablet by mouth once daily before breakfast  for diabetes. 90 tablet 3  . glucose blood (TRUETRACK TEST) test strip Use as instruct to test blood sugar twice a day. 100 each 11  . lisinopril (PRINIVIL,ZESTRIL) 10 MG tablet Take 1 tablet by mouth once daily for blood pressure. 90 tablet 3  . metFORMIN (GLUCOPHAGE-XR) 500 MG 24 hr tablet Take 2 tablets by mouth every evening with dinner for diabetes. 180 tablet 0  . mometasone (NASONEX) 50 MCG/ACT nasal spray Place 2 sprays into the nose daily. 17 g 5  . Multiple Vitamins-Minerals (CENTRUM SILVER PO) Take 1 tablet by mouth daily.     No current facility-administered medications on file prior to visit.     BP 130/82   Pulse 77   Temp 98 F (36.7 C) (Oral)   Ht 5' 6.75" (1.695 m)   Wt 246 lb 4 oz (111.7 kg)   SpO2 98%   BMI 38.86 kg/m    Objective:   Physical Exam  Constitutional: She appears well-nourished.  Neck: Neck supple.  Cardiovascular: Normal rate and regular rhythm.  Respiratory: Effort normal and breath sounds normal.  Skin: Skin is warm and dry.           Assessment & Plan:

## 2018-03-19 NOTE — Patient Instructions (Addendum)
We've increased the dose of your glipizide to 10 mg. Take 1 tablet daily with breakfast. Continue Metformin 1000 mg once daily.  Start exercising. You should be getting 150 minutes of exercise weekly.  Continue to work on [employer]. Ensure you are consuming 64 ounces of water daily.  Schedule a lab only appointment for 3 months to repeat your A1C.   We will see you in September as scheduled. It was a pleasure to see you today!

## 2018-03-19 NOTE — Assessment & Plan Note (Signed)
Increase in A1C today to 8.1 from 7.4 six months ago. Recommended to continue to work on diet, start exercising. Increase Glipizide to 10 mg daily. Continue Metformin 1000 mg daily.  Foot exam today. Eye exam UTD, she will have optometry send report. Pneumonia vaccination due today, provided. Managed on ACE. LDL of 43 in July 2019 without statin.  Follow up in 6 months. Repeat A1C in 3 months.

## 2018-03-19 NOTE — Addendum Note (Signed)
Addended by: Tawnya Crook on: 03/19/2018 11:54 AM   Modules accepted: Orders

## 2018-05-26 ENCOUNTER — Other Ambulatory Visit: Payer: Self-pay | Admitting: Primary Care

## 2018-05-26 DIAGNOSIS — E119 Type 2 diabetes mellitus without complications: Secondary | ICD-10-CM

## 2018-06-16 ENCOUNTER — Other Ambulatory Visit: Payer: Medicare Other

## 2018-08-24 ENCOUNTER — Other Ambulatory Visit: Payer: Self-pay | Admitting: Primary Care

## 2018-08-24 DIAGNOSIS — E119 Type 2 diabetes mellitus without complications: Secondary | ICD-10-CM

## 2018-09-09 ENCOUNTER — Ambulatory Visit: Payer: Medicare Other | Admitting: Internal Medicine

## 2018-09-11 ENCOUNTER — Other Ambulatory Visit: Payer: Self-pay | Admitting: Primary Care

## 2018-09-11 DIAGNOSIS — F4323 Adjustment disorder with mixed anxiety and depressed mood: Secondary | ICD-10-CM

## 2018-09-11 DIAGNOSIS — I10 Essential (primary) hypertension: Secondary | ICD-10-CM

## 2018-09-24 ENCOUNTER — Ambulatory Visit: Payer: Medicare Other

## 2018-09-24 ENCOUNTER — Encounter: Payer: Medicare Other | Admitting: Primary Care

## 2018-09-29 ENCOUNTER — Other Ambulatory Visit: Payer: Self-pay | Admitting: Primary Care

## 2018-09-29 DIAGNOSIS — J302 Other seasonal allergic rhinitis: Secondary | ICD-10-CM

## 2018-10-15 ENCOUNTER — Encounter: Payer: Self-pay | Admitting: Primary Care

## 2018-10-15 ENCOUNTER — Other Ambulatory Visit: Payer: Self-pay

## 2018-10-15 ENCOUNTER — Ambulatory Visit (INDEPENDENT_AMBULATORY_CARE_PROVIDER_SITE_OTHER): Payer: Medicare Other | Admitting: Primary Care

## 2018-10-15 VITALS — BP 128/80 | HR 82 | Temp 97.6°F | Ht 66.75 in | Wt 249.2 lb

## 2018-10-15 DIAGNOSIS — E119 Type 2 diabetes mellitus without complications: Secondary | ICD-10-CM

## 2018-10-15 DIAGNOSIS — Z0000 Encounter for general adult medical examination without abnormal findings: Secondary | ICD-10-CM | POA: Diagnosis not present

## 2018-10-15 DIAGNOSIS — I10 Essential (primary) hypertension: Secondary | ICD-10-CM | POA: Diagnosis not present

## 2018-10-15 DIAGNOSIS — G4733 Obstructive sleep apnea (adult) (pediatric): Secondary | ICD-10-CM

## 2018-10-15 DIAGNOSIS — B009 Herpesviral infection, unspecified: Secondary | ICD-10-CM

## 2018-10-15 DIAGNOSIS — Z23 Encounter for immunization: Secondary | ICD-10-CM | POA: Diagnosis not present

## 2018-10-15 DIAGNOSIS — F4323 Adjustment disorder with mixed anxiety and depressed mood: Secondary | ICD-10-CM

## 2018-10-15 DIAGNOSIS — E042 Nontoxic multinodular goiter: Secondary | ICD-10-CM

## 2018-10-15 DIAGNOSIS — D696 Thrombocytopenia, unspecified: Secondary | ICD-10-CM | POA: Diagnosis not present

## 2018-10-15 LAB — COMPREHENSIVE METABOLIC PANEL
ALT: 18 U/L (ref 0–35)
AST: 20 U/L (ref 0–37)
Albumin: 4 g/dL (ref 3.5–5.2)
Alkaline Phosphatase: 73 U/L (ref 39–117)
BUN: 19 mg/dL (ref 6–23)
CO2: 27 mEq/L (ref 19–32)
Calcium: 9.8 mg/dL (ref 8.4–10.5)
Chloride: 105 mEq/L (ref 96–112)
Creatinine, Ser: 0.95 mg/dL (ref 0.40–1.20)
GFR: 56.88 mL/min — ABNORMAL LOW (ref >60.00)
Glucose, Bld: 158 mg/dL — ABNORMAL HIGH (ref 70–99)
Potassium: 4.4 mEq/L (ref 3.5–5.1)
Sodium: 139 mEq/L (ref 135–145)
Total Bilirubin: 0.9 mg/dL (ref 0.2–1.2)
Total Protein: 6.2 g/dL (ref 6.0–8.3)

## 2018-10-15 LAB — HEMOGLOBIN A1C: Hgb A1c MFr Bld: 8.8 % — ABNORMAL HIGH (ref 4.6–6.5)

## 2018-10-15 LAB — LIPID PANEL
Cholesterol: 109 mg/dL (ref 0–200)
HDL: 43.5 mg/dL (ref >39.00)
LDL Cholesterol: 48 mg/dL (ref 0–99)
NonHDL: 65.15
Total CHOL/HDL Ratio: 2
Triglycerides: 88 mg/dL (ref 0.0–149.0)
VLDL: 17.6 mg/dL (ref 0.0–40.0)

## 2018-10-15 LAB — TSH: TSH: 0.11 u[IU]/mL — ABNORMAL LOW (ref 0.35–4.50)

## 2018-10-15 LAB — CBC
HCT: 41.5 % (ref 36.0–46.0)
Hemoglobin: 13.9 g/dL (ref 12.0–15.0)
MCHC: 33.5 g/dL (ref 30.0–36.0)
MCV: 91 fl (ref 78.0–100.0)
Platelets: 138 10*3/uL — ABNORMAL LOW (ref 150.0–400.0)
RBC: 4.56 Mil/uL (ref 3.87–5.11)
RDW: 13.7 % (ref 11.5–15.5)
WBC: 7.3 10*3/uL (ref 4.0–10.5)

## 2018-10-15 MED ORDER — TRUETRACK TEST VI STRP: ORAL_STRIP | 11 refills | Status: DC

## 2018-10-15 NOTE — Assessment & Plan Note (Signed)
Compliant to CPAP nightly, continue same.  

## 2018-10-15 NOTE — Addendum Note (Signed)
Addended by: Jacqualin Combes on: 10/15/2018 12:38 PM   Modules accepted: Orders

## 2018-10-15 NOTE — Assessment & Plan Note (Signed)
Repeat CBC pending. 

## 2018-10-15 NOTE — Assessment & Plan Note (Signed)
Immunizations UTD. Declines mammogram, bone density scan, colonoscopy given age and despite recommendations. Strongly advised she work on weight loss through a healthy diet and regular exercise. Exam today stable. Labs pending.

## 2018-10-15 NOTE — Assessment & Plan Note (Signed)
Stable in the office today, continue current regimen. 

## 2018-10-15 NOTE — Assessment & Plan Note (Signed)
Repeat A1C pending, last A1C of 8.1 which is above goal. Long discussion today regarding importance of weight loss through diet and exercise.  Managed on ACE. Lipid panel pending. Pneumonia vaccination UTD. Foot exam UTD.  Follow up in 3-6 months.

## 2018-10-15 NOTE — Assessment & Plan Note (Signed)
Infrequent outbreaks, continue PRN acyclovir.

## 2018-10-15 NOTE — Assessment & Plan Note (Signed)
Doing well on fluoxetine daily, continue same.

## 2018-10-15 NOTE — Patient Instructions (Signed)
Stop by the lab prior to leaving today. I will notify you of your results once received.   Start exercising. You should be getting 150 minutes of moderate intensity exercise weekly.  It's important to improve your diet by reducing consumption of fast food, fried food, processed snack foods, sugary drinks. Increase consumption of fresh vegetables and fruits, whole grains, water.  Ensure you are drinking 64 ounces of water daily.  Please schedule a follow up appointment in 6 months for diabetes check.  It was a pleasure to see you today!   Preventive Care 78 Years and Older, Female Preventive care refers to lifestyle choices and visits with your health care provider that can promote health and wellness. This includes:  A yearly physical exam. This is also called an annual well check.  Regular dental and eye exams.  Immunizations.  Screening for certain conditions.  Healthy lifestyle choices, such as diet and exercise. What can I expect for my preventive care visit? Physical exam Your health care provider will check:  Height and weight. These may be used to calculate body mass index (BMI), which is a measurement that tells if you are at a healthy weight.  Heart rate and blood pressure.  Your skin for abnormal spots. Counseling Your health care provider may ask you questions about:  Alcohol, tobacco, and drug use.  Emotional well-being.  Home and relationship well-being.  Sexual activity.  Eating habits.  History of falls.  Memory and ability to understand (cognition).  Work and work Statistician.  Pregnancy and menstrual history. What immunizations do I need?  Influenza (flu) vaccine  This is recommended every year. Tetanus, diphtheria, and pertussis (Tdap) vaccine  You may need a Td booster every 10 years. Varicella (chickenpox) vaccine  You may need this vaccine if you have not already been vaccinated. Zoster (shingles) vaccine  You may need this after  age 40. Pneumococcal conjugate (PCV13) vaccine  One dose is recommended after age 13. Pneumococcal polysaccharide (PPSV23) vaccine  One dose is recommended after age 1. Measles, mumps, and rubella (MMR) vaccine  You may need at least one dose of MMR if you were born in 1957 or later. You may also need a second dose. Meningococcal conjugate (MenACWY) vaccine  You may need this if you have certain conditions. Hepatitis A vaccine  You may need this if you have certain conditions or if you travel or work in places where you may be exposed to hepatitis A. Hepatitis B vaccine  You may need this if you have certain conditions or if you travel or work in places where you may be exposed to hepatitis B. Haemophilus influenzae type b (Hib) vaccine  You may need this if you have certain conditions. You may receive vaccines as individual doses or as more than one vaccine together in one shot (combination vaccines). Talk with your health care provider about the risks and benefits of combination vaccines. What tests do I need? Blood tests  Lipid and cholesterol levels. These may be checked every 5 years, or more frequently depending on your overall health.  Hepatitis C test.  Hepatitis B test. Screening  Lung cancer screening. You may have this screening every year starting at age 78 if you have a 30-pack-year history of smoking and currently smoke or have quit within the past 15 years.  Colorectal cancer screening. All adults should have this screening starting at age 4 and continuing until age 30. Your health care provider may recommend screening at age 96  if you are at increased risk. You will have tests every 1-10 years, depending on your results and the type of screening test.  Diabetes screening. This is done by checking your blood sugar (glucose) after you have not eaten for a while (fasting). You may have this done every 1-3 years.  Mammogram. This may be done every 1-2 years. Talk  with your health care provider about how often you should have regular mammograms.  BRCA-related cancer screening. This may be done if you have a family history of breast, ovarian, tubal, or peritoneal cancers. Other tests  Sexually transmitted disease (STD) testing.  Bone density scan. This is done to screen for osteoporosis. You may have this done starting at age 53. Follow these instructions at home: Eating and drinking  Eat a diet that includes fresh fruits and vegetables, whole grains, lean protein, and low-fat dairy products. Limit your intake of foods with high amounts of sugar, saturated fats, and salt.  Take vitamin and mineral supplements as recommended by your health care provider.  Do not drink alcohol if your health care provider tells you not to drink.  If you drink alcohol: ? Limit how much you have to 0-1 drink a day. ? Be aware of how much alcohol is in your drink. In the [place] one drink equals one 12 oz bottle of beer (355 mL), one 5 oz glass of wine (148 mL), or one 1 oz glass of hard liquor (44 mL). Lifestyle  Take daily care of your teeth and gums.  Stay active. Exercise for at least 30 minutes on 5 or more days each week.  Do not use any products that contain nicotine or tobacco, such as cigarettes, e-cigarettes, and chewing tobacco. If you need help quitting, ask your health care provider.  If you are sexually active, practice safe sex. Use a condom or other form of protection in order to prevent STIs (sexually transmitted infections).  Talk with your health care provider about taking a low-dose aspirin or statin. What's next?  Go to your health care provider once a year for a well check visit.  Ask your health care provider how often you should have your eyes and teeth checked.  Stay up to date on all vaccines. This information is not intended to replace advice given to you by your health care provider. Make sure you discuss any questions you have with  your health care provider. Document Released: 02/03/2015 Document Revised: 01/01/2018 Document Reviewed: 01/01/2018 Elsevier Patient Education  2020 [employer].

## 2018-10-15 NOTE — Progress Notes (Signed)
Subjective:    Patient ID: Courtney Stone, female    DOB: [birth year], 78 y.o.   MRN: [id number]  HPI  Courtney Stone is a 78 year old female who presents today for complete physical.  Immunizations: -Influenza: Due today -Pneumonia: Completed in 2015 and 2020 -Shingles: Completed in 2011  Diet: She endorses a fair diet with mostly home cooked meals, some take out. Protein, vegetables, starch. Larger portions. Snacking on nuts and fruit. Little desserts. Drinking water, coffee.  Exercise: She is not exercising much  Eye exam: Completed in February 2020 Dental exam: Completes semi-annually  Colonoscopy: Attempted years ago, unable to complete due to torturous colon.   Mammogram: Completed in 2017, declines given age. Dexa: Completed in 2017, normal. Declines.   BP Readings from Last 3 Encounters:  10/15/18 128/80  03/19/18 130/82  09/16/17 116/72   She checks her blood sugars every several days and is getting readings of 180's.   Review of Systems  Constitutional: Negative for unexpected weight change.  HENT: Negative for rhinorrhea.   Respiratory: Negative for cough and shortness of breath.   Cardiovascular: Negative for chest pain.  Gastrointestinal: Negative for constipation and diarrhea.  Genitourinary: Negative for difficulty urinating.  Musculoskeletal: Negative for arthralgias and myalgias.  Skin: Negative for rash.  Allergic/Immunologic: Negative for environmental allergies.  Neurological: Negative for numbness and headaches.       Intermittent dizziness with abrupt positional changes.   Psychiatric/Behavioral: The patient is not nervous/anxious.        Past Medical History:  Diagnosis Date  . Anxiety and depression   . Diabetes mellitus   . HTN (hypertension)    120/80's at home  . Thyroid disorder      Social History   Socioeconomic History  . Marital status: Married    Spouse name: Not on file  . Number of children: Not on file  . Years of  education: Not on file  . Highest education level: Not on file  Occupational History  . Not on file  Social Needs  . Financial resource strain: Not on file  . Food insecurity    Worry: Not on file    Inability: Not on file  . Transportation needs    Medical: Not on file    Non-medical: Not on file  Tobacco Use  . Smoking status: Never Smoker  . Smokeless tobacco: Never Used  Substance and Sexual Activity  . Alcohol use: No  . Drug use: No  . Sexual activity: Never  Lifestyle  . Physical activity    Days per week: Not on file    Minutes per session: Not on file  . Stress: Not on file  Relationships  . Social Musician on phone: Not on file    Gets together: Not on file    Attends religious service: Not on file    Active member of club or organization: Not on file    Attends meetings of clubs or organizations: Not on file    Relationship status: Not on file  . Intimate partner violence    Fear of current or ex partner: Not on file    Emotionally abused: Not on file    Physically abused: Not on file    Forced sexual activity: Not on file  Other Topics Concern  . Not on file  Social History Narrative   Married.    2 children, 2 grand children.   Lives in [place].   Retired, once  worked as a Pharmacist, hospital.    Enjoys spending time with family, reading.     Past Surgical History:  Procedure Laterality Date  . ABDOMINAL HYSTERECTOMY  1976   complete  . BREAST BIOPSY Right    neg  . BREAST SURGERY  1968   benign  . CATARACT EXTRACTION, BILATERAL  2016   [facility]   . NECK SURGERY  1993   ?bone spur removal  . URETHRA SURGERY    . WRIST FRACTURE SURGERY      Family History  Problem Relation Age of Onset  . Cancer Mother        lung  . Cancer Father   . COPD Father   . Diabetes Sister   . Hypertension Sister   . Breast cancer Paternal Grandmother 34    Allergies  Allergen Reactions  . Adhesive [Tape]   . Sulfa Antibiotics      Headache  . Latex Rash    Current Outpatient Medications on File Prior to Visit  Medication Sig Dispense Refill  . acyclovir ointment (ZOVIRAX) 5 % Apply topically five times daily for three days as needed for cold sore. 5 g 0  . FLUoxetine (PROZAC) 20 MG capsule TAKE 1 CAPSULE(20 MG) BY MOUTH DAILY 90 capsule 1  . glipiZIDE (GLUCOTROL XL) 10 MG 24 hr tablet Take 1 tablet (10 mg total) by mouth daily with breakfast. For diabetes. 90 tablet 3  . lisinopril (ZESTRIL) 10 MG tablet TAKE 1 TABLET BY MOUTH EVERY DAY FOR BLOOD PRESSURE 90 tablet 1  . metFORMIN (GLUCOPHAGE-XR) 500 MG 24 hr tablet TAKE 2 TABLETS(1000 MG) BY MOUTH DAILY WITH BREAKFAST 180 tablet 1  . mometasone (NASONEX) 50 MCG/ACT nasal spray SHAKE LIQUID AND USE 2 SPRAYS NASALLY DAILY 17 g 5  . Multiple Vitamins-Minerals (CENTRUM SILVER PO) Take 1 tablet by mouth daily.     No current facility-administered medications on file prior to visit.     BP 128/80   Pulse 82   Temp 97.6 F (36.4 C) (Temporal)   Ht 5' 6.75" (1.695 m)   Wt 249 lb 4 oz (113.1 kg)   SpO2 95%   BMI 39.33 kg/m    Objective:   Physical Exam  Constitutional: She is oriented to person, place, and time. She appears well-nourished.  HENT:  Right Ear: Tympanic membrane and ear canal normal.  Left Ear: Tympanic membrane and ear canal normal.  Mouth/Throat: Oropharynx is clear and moist.  Eyes: Pupils are equal, round, and reactive to light. EOM are normal.  Neck: Neck supple.  Cardiovascular: Normal rate and regular rhythm.  Respiratory: Effort normal and breath sounds normal.  GI: Soft. Bowel sounds are normal. There is no abdominal tenderness.  Musculoskeletal: Normal range of motion.  Neurological: She is alert and oriented to person, place, and time.  Skin: Skin is warm and dry.  Psychiatric: She has a normal mood and affect.           Assessment & Plan:

## 2018-10-30 ENCOUNTER — Other Ambulatory Visit: Payer: Self-pay | Admitting: Primary Care

## 2018-10-30 DIAGNOSIS — E119 Type 2 diabetes mellitus without complications: Secondary | ICD-10-CM

## 2018-10-30 MED ORDER — SITAGLIPTIN PHOSPHATE 100 MG PO TABS: 100.0000 mg | ORAL_TABLET | Freq: Every day | ORAL | 1 refills | Status: DC

## 2018-10-30 MED ORDER — BLOOD GLUCOSE METER KIT: PACK | 0 refills | Status: DC

## 2018-10-30 NOTE — Telephone Encounter (Signed)
Spoken to patient and she stated that her test strips are no longer available.  Faxed to [facility], a Rx for a new meter with 100 test strips and 100 lancets

## 2018-11-28 ENCOUNTER — Other Ambulatory Visit: Payer: Self-pay | Admitting: Primary Care

## 2019-02-02 ENCOUNTER — Ambulatory Visit: Payer: Medicare Other | Admitting: Primary Care

## 2019-03-01 ENCOUNTER — Other Ambulatory Visit: Payer: Self-pay | Admitting: Primary Care

## 2019-03-01 DIAGNOSIS — E119 Type 2 diabetes mellitus without complications: Secondary | ICD-10-CM

## 2019-03-01 DIAGNOSIS — F4323 Adjustment disorder with mixed anxiety and depressed mood: Secondary | ICD-10-CM

## 2019-03-01 DIAGNOSIS — I10 Essential (primary) hypertension: Secondary | ICD-10-CM

## 2019-03-04 ENCOUNTER — Other Ambulatory Visit: Payer: Self-pay | Admitting: Primary Care

## 2019-03-04 DIAGNOSIS — E119 Type 2 diabetes mellitus without complications: Secondary | ICD-10-CM

## 2019-03-22 LAB — HM DIABETES EYE EXAM

## 2019-03-26 ENCOUNTER — Other Ambulatory Visit: Payer: Self-pay | Admitting: Primary Care

## 2019-03-26 MED ORDER — ACCU-CHEK FASTCLIX LANCETS MISC: 5 refills | Status: DC

## 2019-03-26 MED ORDER — ACCU-CHEK AVIVA PLUS VI STRP: ORAL_STRIP | 12 refills | Status: DC

## 2019-03-26 MED ORDER — ACCU-CHEK AVIVA PLUS W/DEVICE KIT: PACK | 0 refills | Status: DC

## 2019-03-27 ENCOUNTER — Encounter: Payer: Self-pay | Admitting: Primary Care

## 2019-04-14 ENCOUNTER — Other Ambulatory Visit: Payer: Self-pay

## 2019-04-14 ENCOUNTER — Ambulatory Visit: Payer: Medicare PPO | Admitting: Primary Care

## 2019-04-14 ENCOUNTER — Encounter: Payer: Self-pay | Admitting: Primary Care

## 2019-04-14 VITALS — BP 128/76 | HR 81 | Temp 98.0°F | Wt 249.0 lb

## 2019-04-14 DIAGNOSIS — E119 Type 2 diabetes mellitus without complications: Secondary | ICD-10-CM | POA: Diagnosis not present

## 2019-04-14 LAB — POCT GLYCOSYLATED HEMOGLOBIN (HGB A1C): Hemoglobin A1C: 8.3 % — AB (ref 4.0–5.6)

## 2019-04-14 NOTE — Assessment & Plan Note (Signed)
Uncontrolled at 8.3 today but improved since last visit.  Recommended to add in Metformin 500 XR in the AM, she kindly declines.  She would like to work on weight loss through diet and exercise.   Will repeat A1C in 3 months, if above goal then add in metformin 500 XR to AM.

## 2019-04-14 NOTE — Patient Instructions (Signed)
Start exercising. You should be getting 150 minutes of exercise weekly.  It is important that you improve your diet. Please limit carbohydrates in the form of white bread, rice, pasta, sweets, fast food, fried food, sugary drinks, etc. Increase your consumption of fresh fruits and vegetables, whole grains, lean protein.  Ensure you are consuming 64 ounces of water daily.  Schedule a lab appointment for 3 months for repeat diabetes test.  We will see you in 6 months for your annual physical.  It was a pleasure to see you today!   Diabetes Mellitus and Nutrition, Adult When you have diabetes (diabetes mellitus), it is very important to have healthy eating habits because your blood sugar (glucose) levels are greatly affected by what you eat and drink. Eating healthy foods in the appropriate amounts, at about the same times every day, can help you:  Control your blood glucose.  Lower your risk of heart disease.  Improve your blood pressure.  Reach or maintain a healthy weight. Every person with diabetes is different, and each person has different needs for a meal plan. Your health care provider may recommend that you work with a diet and nutrition specialist (dietitian) to make a meal plan that is best for you. Your meal plan may vary depending on factors such as:  The calories you need.  The medicines you take.  Your weight.  Your blood glucose, blood pressure, and cholesterol levels.  Your activity level.  Other health conditions you have, such as heart or kidney disease. How do carbohydrates affect me? Carbohydrates, also called carbs, affect your blood glucose level more than any other type of food. Eating carbs naturally raises the amount of glucose in your blood. Carb counting is a method for keeping track of how many carbs you eat. Counting carbs is important to keep your blood glucose at a healthy level, especially if you use insulin or take certain oral diabetes  medicines. It is important to know how many carbs you can safely have in each meal. This is different for every person. Your dietitian can help you calculate how many carbs you should have at each meal and for each snack. Foods that contain carbs include:  Bread, cereal, rice, pasta, and crackers.  Potatoes and corn.  Peas, beans, and lentils.  Milk and yogurt.  Fruit and juice.  Desserts, such as cakes, cookies, ice cream, and candy. How does alcohol affect me? Alcohol can cause a sudden decrease in blood glucose (hypoglycemia), especially if you use insulin or take certain oral diabetes medicines. Hypoglycemia can be a life-threatening condition. Symptoms of hypoglycemia (sleepiness, dizziness, and confusion) are similar to symptoms of having too much alcohol. If your health care provider says that alcohol is safe for you, follow these guidelines:  Limit alcohol intake to no more than 1 drink per day for nonpregnant women and 2 drinks per day for men. One drink equals 12 oz of beer, 5 oz of wine, or 1 oz of hard liquor.  Do not drink on an empty stomach.  Keep yourself hydrated with water, diet soda, or unsweetened iced tea.  Keep in mind that regular soda, juice, and other mixers may contain a lot of sugar and must be counted as carbs. What are tips for following this plan?  Reading food labels  Start by checking the serving size on the "Nutrition Facts" label of packaged foods and drinks. The amount of calories, carbs, fats, and other nutrients listed on the label is based  on one serving of the item. Many items contain more than one serving per package.  Check the total grams (g) of carbs in one serving. You can calculate the number of servings of carbs in one serving by dividing the total carbs by 15. For example, if a food has 30 g of total carbs, it would be equal to 2 servings of carbs.  Check the number of grams (g) of saturated and trans fats in one serving. Choose foods  that have low or no amount of these fats.  Check the number of milligrams (mg) of salt (sodium) in one serving. Most people should limit total sodium intake to less than 2,300 mg per day.  Always check the nutrition information of foods labeled as "low-fat" or "nonfat". These foods may be higher in added sugar or refined carbs and should be avoided.  Talk to your dietitian to identify your daily goals for nutrients listed on the label. Shopping  Avoid buying canned, premade, or processed foods. These foods tend to be high in fat, sodium, and added sugar.  Shop around the outside edge of the grocery store. This includes fresh fruits and vegetables, bulk grains, fresh meats, and fresh dairy. Cooking  Use low-heat cooking methods, such as baking, instead of high-heat cooking methods like deep frying.  Cook using healthy oils, such as olive, canola, or sunflower oil.  Avoid cooking with butter, cream, or high-fat meats. Meal planning  Eat meals and snacks regularly, preferably at the same times every day. Avoid going long periods of time without eating.  Eat foods high in fiber, such as fresh fruits, vegetables, beans, and whole grains. Talk to your dietitian about how many servings of carbs you can eat at each meal.  Eat 4-6 ounces (oz) of lean protein each day, such as lean meat, chicken, fish, eggs, or tofu. One oz of lean protein is equal to: ? 1 oz of meat, chicken, or fish. ? 1 egg. ?  cup of tofu.  Eat some foods each day that contain healthy fats, such as avocado, nuts, seeds, and fish. Lifestyle  Check your blood glucose regularly.  Exercise regularly as told by your health care provider. This may include: ? 150 minutes of moderate-intensity or vigorous-intensity exercise each week. This could be brisk walking, biking, or water aerobics. ? Stretching and doing strength exercises, such as yoga or weightlifting, at least 2 times a week.  Take medicines as told by your  health care provider.  Do not use any products that contain nicotine or tobacco, such as cigarettes and e-cigarettes. If you need help quitting, ask your health care provider.  Work with a Veterinary surgeon or diabetes educator to identify strategies to manage stress and any emotional and social challenges. Questions to ask a health care provider  Do I need to meet with a diabetes educator?  Do I need to meet with a dietitian?  What number can I call if I have questions?  When are the best times to check my blood glucose? Where to find more information:  American Diabetes Association: diabetes.org  Academy of Nutrition and Dietetics: www.eatright.[employer] of Diabetes and Digestive and Kidney Diseases (NIH): CarFlippers.tn Summary  A healthy meal plan will help you control your blood glucose and maintain a healthy lifestyle.  Working with a diet and nutrition specialist (dietitian) can help you make a meal plan that is best for you.  Keep in mind that carbohydrates (carbs) and alcohol have immediate effects on  your blood glucose levels. It is important to count carbs and to use alcohol carefully. This information is not intended to replace advice given to you by your health care provider. Make sure you discuss any questions you have with your health care provider. Document Revised: 12/20/2016 Document Reviewed: 02/12/2016 Elsevier Patient Education  2020 [employer].

## 2019-04-14 NOTE — Progress Notes (Signed)
Subjective:    Patient ID: Courtney Stone, female    DOB: Jan 10, 1941, 79 y.o.   MRN: [id number]  HPI  This visit occurred during the SARS-CoV-2 public health emergency.  Safety protocols were in place, including screening questions prior to the visit, additional usage of staff PPE, and extensive cleaning of exam room while observing appropriate contact time as indicated for disinfecting solutions.   [name] is a 79 year old female with a history of type 2 diabetes, OSA, hypertension, thrombocytopenia, chronic fatigue, anxiety and depression who presents today for follow up of diabetes.  Current medications include: Glipizide XL 10 mg, Metformin XR 1000 mg daily, sitagliptin 100 mg.   She is checking her blood glucose 1 times daily and is getting readings of:  AM fasting: 140-160's  Highest reading: 180 Lowest reading: 140  Last A1C: 8.8 in September 2020,  Last [place] Exam: Completed in March 2021 Last Foot Exam: Due Pneumonia Vaccination: Completed last in 2020 ACE/ARB: Lisinopril  Statin: None, LDL of 48.   BP Readings from Last 3 Encounters:  04/14/19 128/76  10/15/18 128/80  03/19/18 130/82   She is not exercising, endorses a fair diet.    Review of Systems  Eyes: Negative for visual disturbance.  Respiratory: Negative for shortness of breath.   Cardiovascular: Negative for chest pain.  Neurological: Negative for dizziness and headaches.       Past Medical History:  Diagnosis Date  . Anxiety and depression   . Diabetes mellitus   . HTN (hypertension)    120/80's at home  . Thyroid disorder      Social History   Socioeconomic History  . Marital status: Married    Spouse name: Not on file  . Number of children: Not on file  . Years of education: Not on file  . Highest education level: Not on file  Occupational History  . Not on file  Tobacco Use  . Smoking status: Never Smoker  . Smokeless tobacco: Never Used  Substance and Sexual Activity  .  Alcohol use: No  . Drug use: No  . Sexual activity: Never  Other Topics Concern  . Not on file  Social History Narrative   Married.    2 children, 2 grand children.   Lives in [place].   Retired, once worked as a Pharmacist, hospital.    Enjoys spending time with family, reading.    Social Determinants of Health   Financial Resource Strain:   . Difficulty of Paying Living Expenses:   Food Insecurity:   . Worried About Charity fundraiser in the Last Year:   . Arboriculturist in the Last Year:   Transportation Needs:   . Film/video editor (Medical):   Marland Kitchen Lack of Transportation (Non-Medical):   Physical Activity:   . Days of Exercise per Week:   . Minutes of Exercise per Session:   Stress:   . Feeling of Stress :   Social Connections:   . Frequency of Communication with Friends and Family:   . Frequency of Social Gatherings with Friends and Family:   . Attends Religious Services:   . Active Member of Clubs or Organizations:   . Attends Archivist Meetings:   Marland Kitchen Marital Status:   Intimate Partner Violence:   . Fear of Current or Ex-Partner:   . Emotionally Abused:   Marland Kitchen Physically Abused:   . Sexually Abused:     Past Surgical History:  Procedure Laterality Date  .  ABDOMINAL HYSTERECTOMY  1976   complete  . BREAST BIOPSY Right    neg  . BREAST SURGERY  1968   benign  . CATARACT EXTRACTION, BILATERAL  2016   [facility]   . NECK SURGERY  1993   ?bone spur removal  . URETHRA SURGERY    . WRIST FRACTURE SURGERY      Family History  Problem Relation Age of Onset  . Cancer Mother        lung  . Cancer Father   . COPD Father   . Diabetes Sister   . Hypertension Sister   . Breast cancer Paternal Grandmother 28    Allergies  Allergen Reactions  . Adhesive [Tape]   . Sulfa Antibiotics     Headache  . Latex Rash    Current Outpatient Medications on File Prior to Visit  Medication Sig Dispense Refill  . Accu-Chek FastClix Lancets MISC USE TO  TEST BLOOD SUGAR 3 TIMES DAILY DX E11.9 100 each 5  . acyclovir ointment (ZOVIRAX) 5 % Apply topically five times daily for three days as needed for cold sore. 5 g 0  . blood glucose meter kit and supplies Dispense based on patient and insurance preference. Use 3 times daily as directed to test blood sugar. (FOR ICD-10 E10.9, E11.9). 1 each 0  . Blood Glucose Monitoring Suppl (ACCU-CHEK AVIVA PLUS) w/Device KIT USE  TO TEST BLOOD SUGAR 3 TIMES DAILY DX E11.9 1 kit 0  . FLUoxetine (PROZAC) 20 MG capsule TAKE 1 CAPSULE(20 MG) BY MOUTH DAILY 90 capsule 1  . glipiZIDE (GLUCOTROL XL) 10 MG 24 hr tablet TAKE 1 TABLET(10 MG) BY MOUTH DAILY WITH BREAKFAST FOR DIABETES 90 tablet 1  . glucose blood (ACCU-CHEK AVIVA PLUS) test strip USE TO TEST BLOOD SUGAR 3 TIMES DAILY DX E11.9 (Patient taking differently: 1 each by Other route daily. USE TO TEST BLOOD SUGAR 3 TIMES DAILY DX E11.9) 100 each 12  . lisinopril (ZESTRIL) 10 MG tablet TAKE 1 TABLET BY MOUTH EVERY DAY FOR BLOOD PRESSURE 90 tablet 1  . metFORMIN (GLUCOPHAGE-XR) 500 MG 24 hr tablet TAKE 2 TABLETS(1000 MG) BY MOUTH DAILY WITH BREAKFAST (Patient taking differently: TAKE 2 TABLETS(1000 MG) BY MOUTH DAILY WITH DINNER) 180 tablet 1  . mometasone (NASONEX) 50 MCG/ACT nasal spray SHAKE LIQUID AND USE 2 SPRAYS NASALLY DAILY 17 g 5  . Multiple Vitamins-Minerals (CENTRUM SILVER PO) Take 1 tablet by mouth daily.    . sitaGLIPtin (JANUVIA) 100 MG tablet Take 1 tablet (100 mg total) by mouth daily. For diabetes. 90 tablet 1   No current facility-administered medications on file prior to visit.    BP 128/76   Pulse 81   Temp 98 F (36.7 C) (Temporal)   Wt 249 lb (112.9 kg)   SpO2 98%   BMI 39.29 kg/m    Objective:   Physical Exam  Constitutional: She appears well-nourished.  Cardiovascular: Normal rate and regular rhythm.  Respiratory: Effort normal and breath sounds normal.  Musculoskeletal:     Cervical back: Neck supple.  Skin: Skin is warm and  dry.  Psychiatric: She has a normal mood and affect.           Assessment & Plan:

## 2019-05-03 ENCOUNTER — Other Ambulatory Visit: Payer: Self-pay | Admitting: Primary Care

## 2019-05-03 DIAGNOSIS — E119 Type 2 diabetes mellitus without complications: Secondary | ICD-10-CM

## 2019-07-01 ENCOUNTER — Other Ambulatory Visit: Payer: Self-pay | Admitting: Primary Care

## 2019-07-01 DIAGNOSIS — E119 Type 2 diabetes mellitus without complications: Secondary | ICD-10-CM

## 2019-07-14 ENCOUNTER — Other Ambulatory Visit: Payer: Self-pay

## 2019-07-15 ENCOUNTER — Other Ambulatory Visit (INDEPENDENT_AMBULATORY_CARE_PROVIDER_SITE_OTHER): Payer: Medicare PPO

## 2019-07-15 ENCOUNTER — Other Ambulatory Visit: Payer: Self-pay

## 2019-07-15 ENCOUNTER — Telehealth: Payer: Self-pay | Admitting: Primary Care

## 2019-07-15 DIAGNOSIS — E119 Type 2 diabetes mellitus without complications: Secondary | ICD-10-CM

## 2019-07-15 LAB — POCT GLYCOSYLATED HEMOGLOBIN (HGB A1C): Hemoglobin A1C: 7.9 % — AB (ref 4.0–5.6)

## 2019-07-15 NOTE — Telephone Encounter (Signed)
Patient returned your call about her lab results.

## 2019-07-16 MED ORDER — METFORMIN HCL ER 500 MG PO TB24: 1000.0000 mg | ORAL_TABLET | Freq: Two times a day (BID) | ORAL | 0 refills | Status: DC

## 2019-07-16 NOTE — Telephone Encounter (Signed)
-----   Message from Doreene Nest, NP sent at 07/15/2019  1:28 PM EDT ----- Please call patient, she does not use MyChart.  A1c is 7.9, I would prefer to see her lower.  Would she be willing to increase her metformin to 2 tablets twice daily, rather than 2 tablets once daily?  We will continue glipizide and Januvia as prescribed. We will plan to see her this September for her annual physical as scheduled.

## 2019-07-16 NOTE — Telephone Encounter (Signed)
Pt notified of lab results and Jae Dire, NPs comments. Pt is okay with increasing metformin 2 tabs BID. New Rx sent to pharmacy

## 2019-09-06 ENCOUNTER — Other Ambulatory Visit: Payer: Self-pay | Admitting: Primary Care

## 2019-09-06 DIAGNOSIS — I10 Essential (primary) hypertension: Secondary | ICD-10-CM

## 2019-09-06 DIAGNOSIS — E119 Type 2 diabetes mellitus without complications: Secondary | ICD-10-CM

## 2019-09-06 DIAGNOSIS — F4323 Adjustment disorder with mixed anxiety and depressed mood: Secondary | ICD-10-CM

## 2019-09-30 ENCOUNTER — Other Ambulatory Visit: Payer: Self-pay | Admitting: Primary Care

## 2019-09-30 DIAGNOSIS — E042 Nontoxic multinodular goiter: Secondary | ICD-10-CM

## 2019-09-30 DIAGNOSIS — E119 Type 2 diabetes mellitus without complications: Secondary | ICD-10-CM

## 2019-09-30 DIAGNOSIS — D696 Thrombocytopenia, unspecified: Secondary | ICD-10-CM

## 2019-09-30 DIAGNOSIS — I10 Essential (primary) hypertension: Secondary | ICD-10-CM

## 2019-09-30 DIAGNOSIS — Z1159 Encounter for screening for other viral diseases: Secondary | ICD-10-CM

## 2019-10-04 ENCOUNTER — Ambulatory Visit: Payer: Medicare PPO | Admitting: Internal Medicine

## 2019-10-04 NOTE — Telephone Encounter (Signed)
Patient called triage after receiving a message back about scheduling an appointment. Patient stated that she started feeling sick Saturday. Patient stated that she has a history of gallstones. Patient stated Saturday she felt feverish, was vomiting and had diarrhea. Patient stated that she has also felt tired. Patient stated that she had the symptoms Saturday and Sunday but feels better today. Patient stated that she was with a group of women at church Wednesday and no one was wearing a mask. Patient stated that she felt safe being around them without wearing a mask. Patient stated she has still had some loose stools. Patient stated that she has been concerned that she may be having a breakthrough of covid so she has scheduled an appointment tomorrow to be tested. Patient stated now she thinks that it may be her gallbladder. Advised patient that a message will go back to Mayra Reel NP for her to review. Patient was given ER precautions and she verbalized understanding.

## 2019-10-04 NOTE — Progress Notes (Signed)
Rescheduled

## 2019-10-04 NOTE — Telephone Encounter (Signed)
Any other symptoms of Covid-19? Or just loose stools? Would like to see her in the office, but she must pass Covid-19 screen.

## 2019-10-04 NOTE — Telephone Encounter (Signed)
Appointment made for virtual in the am. Reviewed red words and will go to ED if any of them before appointment time.

## 2019-10-04 NOTE — Telephone Encounter (Signed)
I am happy to meet with her virtually if she would like evaluation, would still definitely want her tested for Covid-19

## 2019-10-05 ENCOUNTER — Other Ambulatory Visit: Payer: Self-pay

## 2019-10-05 ENCOUNTER — Telehealth (INDEPENDENT_AMBULATORY_CARE_PROVIDER_SITE_OTHER): Payer: Medicare PPO | Admitting: Primary Care

## 2019-10-05 DIAGNOSIS — R1010 Upper abdominal pain, unspecified: Secondary | ICD-10-CM

## 2019-10-05 DIAGNOSIS — R109 Unspecified abdominal pain: Secondary | ICD-10-CM | POA: Insufficient documentation

## 2019-10-05 NOTE — Assessment & Plan Note (Signed)
Acute upper GI symptoms which could be secondary to GERD, viral GI involvement, Covid-19. Lower suspicion for pancreatitis or gall bladder involvement. She is doing better today.  Await Covid-19 test results. Continue bland diet and water consumption. Increase diet as tolerated. She will update if symptoms trend back to where they were a few days ago.

## 2019-10-05 NOTE — Patient Instructions (Signed)
Please update me once you have your Covid-19 test results.  Continue to focus on hydration with water.  Advance your diet as tolerated.  Please call me if anything changes! Mayra Reel, NP-C

## 2019-10-05 NOTE — Progress Notes (Signed)
Subjective:    Patient ID: Courtney Stone, female    DOB: January 17, 1941, 79 y.o.   MRN: [id number]  HPI  Virtual Visit via Video Note  I connected with Courtney Stone on 10/05/19 at  7:20 AM EDT by a video enabled telemedicine application and verified that I am speaking with the correct person using two identifiers.  We attempted to connect via video but she could not get her sound or video to enable. We conducted our visit via phone which lasted 11 min and 4 sec.  Location: Patient: Home Provider: Office Participants: Patient and myself   I discussed the limitations of evaluation and management by telemedicine and the availability of in person appointments. The patient expressed understanding and agreed to proceed.  History of Present Illness:  Courtney Stone is a 79 year old female with a history of hypertension, OSA, type 2 diabetes, thrombocytopenia, chronic fatigue who presents today with a chief complaint of diarrhea.  Symptoms began three days ago with a gradual onset of nausea, decrease in appetite, mid and left upper abdominal pain, esophageal burning and belching. Her diarrhea has subsided for the most part, mostly soft stools now. She's been eating a bland diet for the last several days, is able to eat and drink well for the most part. She is working to stay hydrated with water. She does have "typical allergy symptoms" with mild runny nose.   She denies fevers, vomiting, loss of taste/smell, cough. She has [facility]. She denies known exposure to Covid-19, but was around a group of women last week and then her family a few days ago, no one had symptoms, but no one in either group wore a mask.    She believes that her symptoms may have been secondary to gallstones. She does have a history of diverticulosis with diverticulitis years ago, her pain now doesn't feel similar. Today she's feeling better than she's felt thus far.   She's  taken Tylenol and one dose of Tums.    Observations/Objective:  Alert and oriented. Does not sound sickly. No distress. Speaking in complete sentences.   Assessment and Plan:  Acute upper GI symptoms which could be secondary to GERD, viral GI involvement, Covid-19. Lower suspicion for pancreatitis or gall bladder involvement. She is doing better today.  Await Covid-19 test results. Continue bland diet and water consumption. Increase diet as tolerated. She will update if symptoms trend back to where they were a few days ago.  Follow Up Instructions:  Please update me once you have your Covid-19 test results.  Continue to focus on hydration with water.  Advance your diet as tolerated.  Please call me if anything changes! Allie Bossier, NP-C    I discussed the assessment and treatment plan with the patient. The patient was provided an opportunity to ask questions and all were answered. The patient agreed with the plan and demonstrated an understanding of the instructions.   The patient was advised to call back or seek an in-person evaluation if the symptoms worsen or if the condition fails to improve as anticipated.    Pleas Koch, NP    Review of Systems  Constitutional: Positive for fatigue. Negative for chills and fever.  HENT: Positive for rhinorrhea.   Respiratory: Negative for cough and shortness of breath.   Gastrointestinal: Positive for abdominal pain and nausea. Negative for blood in stool and vomiting.       Loose stools  Allergic/Immunologic: Positive for environmental allergies.       Past Medical History:  Diagnosis Date  . Anxiety and depression   . Diabetes mellitus   . HTN (hypertension)    120/80's at home  . Thyroid disorder      Social History   Socioeconomic History  . Marital status: Married    Spouse name: Not on file  . Number of children: Not on file  . Years of education: Not on file  . Highest education level: Not on file   Occupational History  . Not on file  Tobacco Use  . Smoking status: Never Smoker  . Smokeless tobacco: Never Used  Vaping Use  . Vaping Use: Never used  Substance and Sexual Activity  . Alcohol use: No  . Drug use: No  . Sexual activity: Never  Other Topics Concern  . Not on file  Social History Narrative   Married.    2 children, 2 grand children.   Lives in [place].   Retired, once worked as a Pharmacist, hospital.    Enjoys spending time with family, reading.    Social Determinants of Health   Financial Resource Strain:   . Difficulty of Paying Living Expenses: Not on file  Food Insecurity:   . Worried About Charity fundraiser in the Last Year: Not on file  . Ran Out of Food in the Last Year: Not on file  Transportation Needs:   . Lack of Transportation (Medical): Not on file  . Lack of Transportation (Non-Medical): Not on file  Physical Activity:   . Days of Exercise per Week: Not on file  . Minutes of Exercise per Session: Not on file  Stress:   . Feeling of Stress : Not on file  Social Connections:   . Frequency of Communication with Friends and Family: Not on file  . Frequency of Social Gatherings with Friends and Family: Not on file  . Attends Religious Services: Not on file  . Active Member of Clubs or Organizations: Not on file  . Attends Archivist Meetings: Not on file  . Marital Status: Not on file  Intimate Partner Violence:   . Fear of Current or Ex-Partner: Not on file  . Emotionally Abused: Not on file  . Physically Abused: Not on file  . Sexually Abused: Not on file    Past Surgical History:  Procedure Laterality Date  . ABDOMINAL HYSTERECTOMY  1976   complete  . BREAST BIOPSY Right    neg  . BREAST SURGERY  1968   benign  . CATARACT EXTRACTION, BILATERAL  2016   [facility]   . NECK SURGERY  1993   ?bone spur removal  . URETHRA SURGERY    . WRIST FRACTURE SURGERY      Family History  Problem Relation Age of Onset  .  Cancer Mother        lung  . Cancer Father   . COPD Father   . Diabetes Sister   . Hypertension Sister   . Breast cancer Paternal Grandmother 11    Allergies  Allergen Reactions  . Adhesive [Tape]   . Sulfa Antibiotics     Headache  . Latex Rash    Current Outpatient Medications on File Prior to Visit  Medication Sig Dispense Refill  . Accu-Chek FastClix Lancets MISC USE TO TEST BLOOD SUGAR 3 TIMES DAILY DX E11.9 100 each 5  . acyclovir ointment (ZOVIRAX) 5 % Apply topically five times daily for  three days as needed for cold sore. 5 g 0  . blood glucose meter kit and supplies Dispense based on patient and insurance preference. Use 3 times daily as directed to test blood sugar. (FOR ICD-10 E10.9, E11.9). 1 each 0  . Blood Glucose Monitoring Suppl (ACCU-CHEK AVIVA PLUS) w/Device KIT USE  TO TEST BLOOD SUGAR 3 TIMES DAILY DX E11.9 1 kit 0  . FLUoxetine (PROZAC) 20 MG capsule TAKE 1 CAPSULE(20 MG) BY MOUTH DAILY 90 capsule 0  . glipiZIDE (GLUCOTROL XL) 10 MG 24 hr tablet TAKE 1 TABLET(10 MG) BY MOUTH DAILY WITH BREAKFAST FOR DIABETES 90 tablet 0  . glucose blood (ACCU-CHEK AVIVA PLUS) test strip USE TO TEST BLOOD SUGAR 3 TIMES DAILY DX E11.9 (Patient taking differently: 1 each by Other route daily. USE TO TEST BLOOD SUGAR 3 TIMES DAILY DX E11.9) 100 each 12  . JANUVIA 100 MG tablet TAKE 1 TABLET(100 MG) BY MOUTH DAILY FOR DIABETES 90 tablet 1  . lisinopril (ZESTRIL) 10 MG tablet TAKE 1 TABLET BY MOUTH EVERY DAY FOR BLOOD PRESSURE 90 tablet 0  . metFORMIN (GLUCOPHAGE-XR) 500 MG 24 hr tablet Take 2 tablets (1,000 mg total) by mouth in the morning and at bedtime. 360 tablet 0  . mometasone (NASONEX) 50 MCG/ACT nasal spray SHAKE LIQUID AND USE 2 SPRAYS NASALLY DAILY 17 g 5  . Multiple Vitamins-Minerals (CENTRUM SILVER PO) Take 1 tablet by mouth daily.     No current facility-administered medications on file prior to visit.    Ht 5' 6.75" (1.695 m)   Wt 249 lb (112.9 kg)   BMI 39.29 kg/m     Objective:   Physical Exam Constitutional:      General: She is not in acute distress.    Appearance: She is not ill-appearing.  Pulmonary:     Effort: Pulmonary effort is normal.     Comments: No cough during visit Neurological:     Mental Status: She is alert and oriented to person, place, and time.  Psychiatric:        Behavior: Behavior normal.            Assessment & Plan:

## 2019-10-07 ENCOUNTER — Telehealth: Payer: Self-pay | Admitting: Primary Care

## 2019-10-07 NOTE — Telephone Encounter (Signed)
Patient inquired about making an appointment in regards to a message she sent below via mychart: "my symptoms of heart burn, frequent bowl movements, bloated and painful stomach, and nausea. I have a history of many gall stones." Please triage!

## 2019-10-07 NOTE — Telephone Encounter (Signed)
Glad to know she is feeling better.  Will await her result.

## 2019-10-07 NOTE — Telephone Encounter (Signed)
I spoke with pt; pt said she spoke with Mayra Reel NP on 10/04/19 and had covid test on 10/05/19 at [facility] and has not gotten covid results yet.. Pt is trying to increase fluids and food and pt is feeling better and is not having pain or nausea at this time. Pt said she will cb with update on covid results and how pt is feeling. UC & ED precautions given and pt voiced understanding. FYI to Allayne Gitelman NP.

## 2019-10-11 ENCOUNTER — Encounter: Payer: Self-pay | Admitting: Family Medicine

## 2019-10-11 ENCOUNTER — Ambulatory Visit: Payer: Medicare PPO | Admitting: Family Medicine

## 2019-10-11 ENCOUNTER — Ambulatory Visit (INDEPENDENT_AMBULATORY_CARE_PROVIDER_SITE_OTHER): Payer: Medicare PPO | Admitting: Internal Medicine

## 2019-10-11 ENCOUNTER — Other Ambulatory Visit: Payer: Self-pay

## 2019-10-11 VITALS — BP 158/90 | HR 91 | Temp 97.4°F | Wt 234.2 lb

## 2019-10-11 VITALS — BP 171/97 | HR 83 | Ht 67.0 in | Wt 235.0 lb

## 2019-10-11 DIAGNOSIS — E042 Nontoxic multinodular goiter: Secondary | ICD-10-CM

## 2019-10-11 DIAGNOSIS — Z1159 Encounter for screening for other viral diseases: Secondary | ICD-10-CM | POA: Diagnosis not present

## 2019-10-11 DIAGNOSIS — Z9989 Dependence on other enabling machines and devices: Secondary | ICD-10-CM

## 2019-10-11 DIAGNOSIS — E119 Type 2 diabetes mellitus without complications: Secondary | ICD-10-CM

## 2019-10-11 DIAGNOSIS — I10 Essential (primary) hypertension: Secondary | ICD-10-CM | POA: Diagnosis not present

## 2019-10-11 DIAGNOSIS — B029 Zoster without complications: Secondary | ICD-10-CM

## 2019-10-11 DIAGNOSIS — G4733 Obstructive sleep apnea (adult) (pediatric): Secondary | ICD-10-CM

## 2019-10-11 DIAGNOSIS — Z7189 Other specified counseling: Secondary | ICD-10-CM | POA: Diagnosis not present

## 2019-10-11 DIAGNOSIS — D696 Thrombocytopenia, unspecified: Secondary | ICD-10-CM | POA: Diagnosis not present

## 2019-10-11 DIAGNOSIS — R21 Rash and other nonspecific skin eruption: Secondary | ICD-10-CM | POA: Diagnosis not present

## 2019-10-11 LAB — COMPREHENSIVE METABOLIC PANEL
ALT: 23 U/L (ref 0–35)
AST: 21 U/L (ref 0–37)
Albumin: 4.2 g/dL (ref 3.5–5.2)
Alkaline Phosphatase: 68 U/L (ref 39–117)
BUN: 11 mg/dL (ref 6–23)
CO2: 27 mEq/L (ref 19–32)
Calcium: 9.8 mg/dL (ref 8.4–10.5)
Chloride: 101 mEq/L (ref 96–112)
Creatinine, Ser: 0.87 mg/dL (ref 0.40–1.20)
GFR: 62.8 mL/min (ref >60.00)
Glucose, Bld: 113 mg/dL — ABNORMAL HIGH (ref 70–99)
Potassium: 3.8 mEq/L (ref 3.5–5.1)
Sodium: 136 mEq/L (ref 135–145)
Total Bilirubin: 1.2 mg/dL (ref 0.2–1.2)
Total Protein: 6.6 g/dL (ref 6.0–8.3)

## 2019-10-11 LAB — LIPID PANEL
Cholesterol: 92 mg/dL (ref 0–200)
HDL: 41.9 mg/dL (ref >39.00)
LDL Cholesterol: 33 mg/dL (ref 0–99)
NonHDL: 50.53
Total CHOL/HDL Ratio: 2
Triglycerides: 89 mg/dL (ref 0.0–149.0)
VLDL: 17.8 mg/dL (ref 0.0–40.0)

## 2019-10-11 LAB — CBC
HCT: 43.7 % (ref 36.0–46.0)
Hemoglobin: 15 g/dL (ref 12.0–15.0)
MCHC: 34.2 g/dL (ref 30.0–36.0)
MCV: 89.6 fl (ref 78.0–100.0)
Platelets: 125 10*3/uL — ABNORMAL LOW (ref 150.0–400.0)
RBC: 4.88 Mil/uL (ref 3.87–5.11)
RDW: 14.4 % (ref 11.5–15.5)
WBC: 6.8 10*3/uL (ref 4.0–10.5)

## 2019-10-11 LAB — TSH: TSH: 0.07 u[IU]/mL — ABNORMAL LOW (ref 0.35–4.50)

## 2019-10-11 LAB — HEMOGLOBIN A1C: Hgb A1c MFr Bld: 7.6 % — ABNORMAL HIGH (ref 4.6–6.5)

## 2019-10-11 MED ORDER — VALACYCLOVIR HCL 1 G PO TABS: 1000.0000 mg | ORAL_TABLET | Freq: Three times a day (TID) | ORAL | 0 refills | Status: AC

## 2019-10-11 NOTE — Assessment & Plan Note (Signed)
Rash with active vesicles. Even though it is beyond 72 hours given severity of rash and active lesions will treat with hope it will shorten course. Suspect the rash is the cause of her back pain. No nerve like pain so will defer gabapentin for pain control and advised some occasional NSAIDs - though with caution. Return if not improved

## 2019-10-11 NOTE — Patient Instructions (Signed)

## 2019-10-11 NOTE — Progress Notes (Signed)
Subjective:     Courtney Stone is a 79 y.o. female presenting for Rash (x 5 days under L breast around to back ) and Back Pain (x 1 week )     Rash Pertinent negatives include no shortness of breath.  Back Pain Pertinent negatives include no chest pain or headaches.    #Back pain - started 9/11  - abdominal pain, nausea/vomiting - had a covid test on 9/14 which was negative - Wednesday 9/15 the rash started - pain at the rash with touching the area - back just hurts and pain feels like it is all over  #HTN - on lisinopril - took today - does not usually have an issue with BP -   Review of Systems  Respiratory: Negative for shortness of breath.   Cardiovascular: Negative for chest pain.  Musculoskeletal: Positive for back pain.  Skin: Positive for rash.  Neurological: Negative for headaches.     Social History   Tobacco Use  Smoking Status Never Smoker  Smokeless Tobacco Never Used        Objective:    BP Readings from Last 3 Encounters:  10/11/19 (!) 158/90  10/11/19 (!) 171/97  04/14/19 128/76   Wt Readings from Last 3 Encounters:  10/11/19 234 lb 4 oz (106.3 kg)  10/11/19 235 lb (106.6 kg)  10/05/19 249 lb (112.9 kg)    BP (!) 158/90   Pulse 91   Temp (!) 97.4 F (36.3 C) (Temporal)   Wt 234 lb 4 oz (106.3 kg)   SpO2 99%   BMI 36.69 kg/m    Physical Exam Constitutional:      General: She is not in acute distress.    Appearance: She is well-developed. She is obese. She is not diaphoretic.  HENT:     Right Ear: External ear normal.     Left Ear: External ear normal.  Eyes:     Conjunctiva/sclera: Conjunctivae normal.  Cardiovascular:     Rate and Rhythm: Normal rate.  Pulmonary:     Effort: Pulmonary effort is normal.  Musculoskeletal:     Cervical back: Neck supple.  Skin:    General: Skin is warm and dry.     Capillary Refill: Capillary refill takes less than 2 seconds.     Comments: Large rash following T7/8 dermatoma on the  abdomen and stretching to the back. Stops at midline. Vesicles in clusters with an erythematous base.   Neurological:     Mental Status: She is alert. Mental status is at baseline.  Psychiatric:        Mood and Affect: Mood normal.        Behavior: Behavior normal.           Assessment & Plan:   Problem List Items Addressed This Visit      Cardiovascular and Mediastinum   Hypertension    BP elevated, though suspect 2/2 to pain. Unable to check BP at home. Advised cont lisinopril and has wellness visit in 2 weeks. She will also return in 1 week if no symptom improvement        Endocrine   Multinodular goiter (Chronic)   Type 2 diabetes mellitus (HCC)     Other   Thrombocytopenia (HCC)   Herpes zoster without complication - Primary    Rash with active vesicles. Even though it is beyond 72 hours given severity of rash and active lesions will treat with hope it will shorten course. Suspect the rash is the  cause of her back pain. No nerve like pain so will defer gabapentin for pain control and advised some occasional NSAIDs - though with caution. Return if not improved      Relevant Medications   valACYclovir (VALTREX) 1000 MG tablet    Other Visit Diagnoses    Encounter for hepatitis C screening test for low risk patient           Return if symptoms worsen or fail to improve.  Lynnda Child, MD  This visit occurred during the SARS-CoV-2 public health emergency.  Safety protocols were in place, including screening questions prior to the visit, additional usage of staff PPE, and extensive cleaning of exam room while observing appropriate contact time as indicated for disinfecting solutions.

## 2019-10-11 NOTE — Progress Notes (Signed)
[facility] [place], [place] [address]  Pulmonary Sleep Medicine   Office Visit Note  Patient Name: Courtney Stone DOB: 05-30-1940 MRN [id number]    Chief Complaint: Obstructive Sleep Apnea visit  Brief History:  Courtney Stone is seen today for follow up The patient has a 5 year history of sleep apnea. Patient is not using PAP nightly.  The patient feels better after sleeping with PAP.  The patient reports benefiting from PAP use. Reported sleepiness is  Improved on CPAP but after going without for a month she is not sleeping well and  and the Epworth Sleepiness Score is 16 out of 24. The patient is now taking naps. The patient complains of the following: none on CPAP  The compliance is not available. The patient does not complain of limb movements disrupting sleep.  ROS  General: (-) fever, (-) chills, (-) night sweat Nose and Sinuses: (-) nasal stuffiness or itchiness, (-) postnasal drip, (-) nosebleeds, (-) sinus trouble. Mouth and Throat: (-) sore throat, (-) hoarseness. Neck: (-) swollen glands, (-) enlarged thyroid, (-) neck pain. Respiratory: - cough, - shortness of breath, - wheezing. Neurologic: - numbness, - tingling. Psychiatric: - anxiety, - depression   Current Medication: Outpatient Encounter Medications as of 10/11/2019  Medication Sig  . Accu-Chek FastClix Lancets MISC USE TO TEST BLOOD SUGAR 3 TIMES DAILY DX E11.9  . acyclovir ointment (ZOVIRAX) 5 % Apply topically five times daily for three days as needed for cold sore.  . blood glucose meter kit and supplies Dispense based on patient and insurance preference. Use 3 times daily as directed to test blood sugar. (FOR ICD-10 E10.9, E11.9).  Marland Kitchen Blood Glucose Monitoring Suppl (ACCU-CHEK AVIVA PLUS) w/Device KIT USE  TO TEST BLOOD SUGAR 3 TIMES DAILY DX E11.9  . FLUoxetine (PROZAC) 20 MG capsule TAKE 1 CAPSULE(20 MG) BY MOUTH DAILY  . glipiZIDE (GLUCOTROL XL) 10 MG 24 hr tablet TAKE 1 TABLET(10 MG)  BY MOUTH DAILY WITH BREAKFAST FOR DIABETES  . glucose blood (ACCU-CHEK AVIVA PLUS) test strip USE TO TEST BLOOD SUGAR 3 TIMES DAILY DX E11.9 (Patient taking differently: 1 each by Other route daily. USE TO TEST BLOOD SUGAR 3 TIMES DAILY DX E11.9)  . JANUVIA 100 MG tablet TAKE 1 TABLET(100 MG) BY MOUTH DAILY FOR DIABETES  . lisinopril (ZESTRIL) 10 MG tablet TAKE 1 TABLET BY MOUTH EVERY DAY FOR BLOOD PRESSURE  . metFORMIN (GLUCOPHAGE-XR) 500 MG 24 hr tablet Take 2 tablets (1,000 mg total) by mouth in the morning and at bedtime.  . mometasone (NASONEX) 50 MCG/ACT nasal spray SHAKE LIQUID AND USE 2 SPRAYS NASALLY DAILY  . Multiple Vitamins-Minerals (CENTRUM SILVER PO) Take 1 tablet by mouth daily.   No facility-administered encounter medications on file as of 10/11/2019.    Surgical History: Past Surgical History:  Procedure Laterality Date  . ABDOMINAL HYSTERECTOMY  1976   complete  . BREAST BIOPSY Right    neg  . BREAST SURGERY  1968   benign  . CATARACT EXTRACTION, BILATERAL  2016   [facility]   . NECK SURGERY  1993   ?bone spur removal  . URETHRA SURGERY    . WRIST FRACTURE SURGERY      Medical History: Past Medical History:  Diagnosis Date  . Anxiety and depression   . Diabetes mellitus   . HTN (hypertension)    120/80's at home  . Thyroid disorder     Family History: Non contributory to the present illness  Social History: Social History   Socioeconomic History  . Marital status: Married    Spouse name: Not on file  . Number of children: Not on file  . Years of education: Not on file  . Highest education level: Not on file  Occupational History  . Not on file  Tobacco Use  . Smoking status: Never Smoker  . Smokeless tobacco: Never Used  Vaping Use  . Vaping Use: Never used  Substance and Sexual Activity  . Alcohol use: No  . Drug use: No  . Sexual activity: Never  Other Topics Concern  . Not on file  Social History Narrative   Married.    2  children, 2 grand children.   Lives in [place].   Retired, once worked as a Pharmacist, hospital.    Enjoys spending time with family, reading.    Social Determinants of Health   Financial Resource Strain:   . Difficulty of Paying Living Expenses: Not on file  Food Insecurity:   . Worried About Charity fundraiser in the Last Year: Not on file  . Ran Out of Food in the Last Year: Not on file  Transportation Needs:   . Lack of Transportation (Medical): Not on file  . Lack of Transportation (Non-Medical): Not on file  Physical Activity:   . Days of Exercise per Week: Not on file  . Minutes of Exercise per Session: Not on file  Stress:   . Feeling of Stress : Not on file  Social Connections:   . Frequency of Communication with Friends and Family: Not on file  . Frequency of Social Gatherings with Friends and Family: Not on file  . Attends Religious Services: Not on file  . Active Member of Clubs or Organizations: Not on file  . Attends Archivist Meetings: Not on file  . Marital Status: Not on file  Intimate Partner Violence:   . Fear of Current or Ex-Partner: Not on file  . Emotionally Abused: Not on file  . Physically Abused: Not on file  . Sexually Abused: Not on file    Vital Signs: Blood pressure (!) 171/97, pulse 83, height _0  (1.702 m), weight 235 lb (106.6 kg), SpO2 97 %.  Examination: General Appearance: The patient is well-developed, well-nourished, and in no distress. Neck Circumference: 40 Skin: Gross inspection of skin unremarkable. Head: normocephalic, no gross deformities. Eyes: no gross deformities noted. ENT: ears appear grossly normal Neurologic: Alert and oriented. No involuntary movements.    EPWORTH SLEEPINESS SCALE:  Scale:  (0)= no chance of dozing; (1)= slight chance of dozing; (2)= moderate chance of dozing; (3)= high chance of dozing  Chance  Situtation    Sitting and reading: 3    Watching TV: 3    Sitting Inactive in public: 1     As a passenger in car: 1      Lying down to rest: 3    Sitting and talking: 1    Sitting quielty after lunch: 3    In a car, stopped in traffic: 1   TOTAL SCORE:   16 out of 24    SLEEP STUDIES:  1. HST 09/2014 REI 15 SpO44mn 81%   CPAP COMPLIANCE DATA:  Not available. Pt stopped using due to recall.   LABS: Recent Results (from the past 2160 hour(s))  POCT glycosylated hemoglobin (Hb A1C)     Status: Abnormal   Collection Time: 07/15/19  9:35 AM  Result Value Ref Range   Hemoglobin  A1C 7.9 (A) 4.0 - 5.6 %   HbA1c POC (<> result, manual entry)     HbA1c, POC (prediabetic range)     HbA1c, POC (controlled diabetic range)      Radiology: NM THYROID MULT UPTAKE W/IMAGING  Result Date: 09/16/2017 CLINICAL DATA:  Thyrotoxicosis without thyroid storm EXAM: THYROID SCAN AND UPTAKE - 4 AND 24 HOURS TECHNIQUE: Following oral administration of I-123 capsule, anterior planar imaging was acquired at 24 hours. Thyroid uptake was calculated with a thyroid probe at 4-6 hours and 24 hours. RADIOPHARMACEUTICALS:  290 uCi I-123 sodium iodide p.o. COMPARISON:  None FINDINGS: Small RIGHT thyroid lobe versus LEFT versus cold nodule at inferior pole RIGHT lobe. No additional areas of increased or decreased tracer localization. 6 hour I-123 uptake = 11.8% (normal 5-20%) 24 hour I-123 uptake = 35.0% (normal 10-30%) IMPRESSION: Mildly elevated 24 hour radio iodine uptake consistent with hyperthyroidism. Question asymmetric thyroid lobes smaller on RIGHT versus cold nodule at inferior pole RIGHT lobe; thyroid ultrasound recommended to exclude inferior pole RIGHT lobe mass. Electronically Signed   By: Lavonia Dana M.D.   On: 09/16/2017 11:26   Assessment and Plan: Patient Active Problem List   Diagnosis Date Noted  . Abdominal pain 10/05/2019  . Herpes simplex 09/16/2017  . Preventative health care 09/16/2017  . Multinodular goiter 11/15/2016  . Adjustment disorder with mixed anxiety and  depressed mood 02/01/2015  . Obstructive sleep apnea 10/26/2014  . Left shoulder pain 09/13/2014  . Chronic fatigue 09/13/2014  . Medicare annual wellness visit, subsequent 02/05/2013  . Thrombocytopenia ([place]) 09/30/2011  . Type 2 diabetes mellitus ([place]) 07/29/2011  . Thyrotoxicosis 07/29/2011  . Obesity (BMI 30-39.9) 06/21/2011  . Hypertension 12/18/2010      The patient does tolerate PAP and reports significant benefit from PAP use. Compliance was excellent at last visit. Currently she is not sleeping well and feels poorly as she stopped using CPAP. She was advised to restart CPAP until she gets a replacement machine. The machine is past  End of life and  a new machine one will be ordered for her. She has lost 12 lbs due to not feeling well.   1. OSA- restart CPAP and follow up 30 days after receiving replacement. 2. CPAP counseling-proper use and cleaning techniques discussed with patient. 3. Rash-She showed me a rash that is on her left side only, under her breast, extends around her side and to her back. Appears to be shingles, advised she needs to contact PCP or been seen by urgent care today for treatment.  General Counseling: I have discussed the findings of the evaluation and examination with Courtney Stone.  I have also discussed any further diagnostic evaluation thatmay be needed or ordered today. Courtney Stone verbalizes understanding of the findings of todays visit. We also reviewed her medications today and discussed drug interactions and side effects including but not limited excessive drowsiness and altered mental states. We also discussed that there is always a risk not just to her but also people around her. she has been encouraged to call the office with any questions or concerns that should arise related to todays visit.  I have personally obtained a history, examined the patient, evaluated laboratory and imaging results, formulated the assessment and plan and placed orders.  This patient  was seen by Theodoro Grist AGNP-C in Collaboration with Dr. Devona Konig as a part of collaborative care agreement.  Richelle Ito Saunders Glance, PhD, FAASM  Diplomate, American Board of Sleep Medicine  Allyne Gee, MD [facility] Diplomate ABMS Pulmonary and Critical Care Medicine Sleep medicine

## 2019-10-11 NOTE — Assessment & Plan Note (Signed)
BP elevated, though suspect 2/2 to pain. Unable to check BP at home. Advised cont lisinopril and has wellness visit in 2 weeks. She will also return in 1 week if no symptom improvement

## 2019-10-11 NOTE — Patient Instructions (Signed)
Shingles  - take the antiviral medication - try Ibuprofen 400-600 mg once daily for severe pain - if pain or rash not improving return on Thursday or Friday to see me or Mayra Reel

## 2019-10-12 ENCOUNTER — Other Ambulatory Visit: Payer: Medicare PPO

## 2019-10-12 LAB — HEPATITIS C ANTIBODY
Hepatitis C Ab: NONREACTIVE
SIGNAL TO CUT-OFF: 0.02 (ref <1.00)

## 2019-10-18 ENCOUNTER — Encounter: Payer: Medicare PPO | Admitting: Primary Care

## 2019-10-19 ENCOUNTER — Encounter: Payer: Self-pay | Admitting: Family Medicine

## 2019-10-19 ENCOUNTER — Other Ambulatory Visit: Payer: Self-pay

## 2019-10-19 ENCOUNTER — Ambulatory Visit: Payer: Medicare PPO | Admitting: Family Medicine

## 2019-10-19 VITALS — BP 140/80 | HR 87 | Temp 97.4°F | Ht 67.0 in | Wt 235.0 lb

## 2019-10-19 DIAGNOSIS — R1010 Upper abdominal pain, unspecified: Secondary | ICD-10-CM

## 2019-10-19 DIAGNOSIS — B0223 Postherpetic polyneuropathy: Secondary | ICD-10-CM | POA: Diagnosis not present

## 2019-10-19 DIAGNOSIS — K5903 Drug induced constipation: Secondary | ICD-10-CM | POA: Diagnosis not present

## 2019-10-19 MED ORDER — LIDOCAINE 5 % EX PTCH: 1.0000 | MEDICATED_PATCH | CUTANEOUS | 0 refills | Status: DC

## 2019-10-19 NOTE — Assessment & Plan Note (Signed)
Fiber and stool softener as well as working to reduce tramadol need through other pain relievers.

## 2019-10-19 NOTE — Assessment & Plan Note (Signed)
Suspect this is related to zoster as well as constipation and endorsing some heartburn. Omeprazole for heartburn symptoms. Also advised fiber supplement and early stool softener for constipation to see if symptoms improve

## 2019-10-19 NOTE — Assessment & Plan Note (Signed)
Rash overall improving but with persistent pain. Will start lidocaine patch above the rash and continue tramadol - though discussed reducing use. Offered gabapentin vs extension of tramadol for up to 1 week before recommending switch to gabapentin if neuropathy continues. She would prefer to do the patch and work to self titrate. She will reach out if pain severe and persistent.

## 2019-10-19 NOTE — Progress Notes (Signed)
Subjective:     Courtney Stone is a 79 y.o. female presenting for Follow-up (shingles )     HPI  #LUQ pain - taking the pain medication  - endorses constipation - low appetite - endorses reflux symptoms and some nausea - has not needed the nausea medication - taking tramadol every 8 hours - for severe pain - tylenol does not help with pain - endorses burning and tingling pain in the back and under the breast - at one point had specific LUQ pain but now just general bloating pain  Review of Systems  10/11/2019: Clinic - herpes zoster - LUQ and back pain - near region for rash 10/15/2019: Urgent care - LUQ pain - near rash - normal CBC/CMP - tramadol and tylenol for pain  Social History   Tobacco Use  Smoking Status Never Smoker  Smokeless Tobacco Never Used        Objective:    BP Readings from Last 3 Encounters:  10/19/19 140/80  10/11/19 (!) 158/90  10/11/19 (!) 171/97   Wt Readings from Last 3 Encounters:  10/19/19 235 lb (106.6 kg)  10/11/19 234 lb 4 oz (106.3 kg)  10/11/19 235 lb (106.6 kg)    BP 140/80   Pulse 87   Temp (!) 97.4 F (36.3 C) (Temporal)   Ht 5\' 7"  (1.702 m)   Wt 235 lb (106.6 kg)   SpO2 99%   BMI 36.81 kg/m    Physical Exam Constitutional:      General: She is not in acute distress.    Appearance: She is well-developed. She is not diaphoretic.  HENT:     Right Ear: External ear normal.     Left Ear: External ear normal.     Nose: Nose normal.  Eyes:     Conjunctiva/sclera: Conjunctivae normal.  Cardiovascular:     Rate and Rhythm: Normal rate.  Pulmonary:     Effort: Pulmonary effort is normal.  Abdominal:     General: Bowel sounds are normal. There is distension.     Palpations: Abdomen is soft.     Tenderness: There is abdominal tenderness in the epigastric area and left upper quadrant. There is no guarding or rebound.  Musculoskeletal:     Cervical back: Neck supple.  Skin:    General: Skin is warm and dry.      Capillary Refill: Capillary refill takes less than 2 seconds.     Comments: Healing ulcers on erythematous base below the left breast and reaching around the back along the dermatome   Neurological:     Mental Status: She is alert. Mental status is at baseline.  Psychiatric:        Mood and Affect: Mood normal.        Behavior: Behavior normal.           Assessment & Plan:   Problem List Items Addressed This Visit      Digestive   Drug-induced constipation    Fiber and stool softener as well as working to reduce tramadol need through other pain relievers.         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 Will start lidocaine patch above the rash and continue tramadol - though discussed reducing use. Offered gabapentin vs extension of tramadol for up to 1 week before recommending switch to gabapentin if neuropathy continues. She would prefer to do the patch  and work to self titrate. She will reach out if pain severe and persistent.       Relevant Medications   lidocaine (LIDODERM) 5 %     Other   Abdominal pain    Suspect this is related to zoster as well as constipation and endorsing some heartburn. Omeprazole for heartburn symptoms. Also advised fiber supplement and early stool softener for constipation to see if symptoms improve          Return in about 1 week (around 10/26/2019), or if symptoms worsen or fail to improve.  Lynnda Child, MD  This visit occurred during the SARS-CoV-2 public health emergency.  Safety protocols were in place, including screening questions prior to the visit, additional usage of staff PPE, and extensive cleaning of exam room while observing appropriate contact time as indicated for disinfecting solutions.

## 2019-10-19 NOTE — Patient Instructions (Addendum)
Pain - Lidocaine patch - above lesions - continue tramadol - if pain not improving - will likely recommend gabapentin instead   Heartburn - Omeprazole for 1-2 weeks   Constipation   Constipation is a common issue. Often it is related to diet and occasionally medications.   What you can do to treat your symptoms 1) Fiber -- Eat more fiber rich foods: beans, broccoli, berries, avocados, popcorn, pear/apple, green peas, turnip greens, brussels sprouts, whole grains (barley, bran, quinoa, oatmeal) -- Take a Fiber supplement: Psyllium (Metamucil)  -- Could also eat Prunes daily  2) Hydration  -- Drink more water: Try to drink 64 oz of water per day  3) Exercise -- Moderate exercise (walking, jogging, biking) for 30 minutes, 5 days a week  4) Dedicate time for Bowel movements - do not delay  5) Stool Softener  - Docusate Sodium (Colace) 100 mg daily or twice daily as needed   Treating chronic constipation is often about finding the right amount of medication and fiber to keep you regular and comfortable. For some people that may be daily metamucil and colace every other day. For others it may be Metamucil and colace twice daily and Miralax 3 times a week. The goal is to go slow and listen to your body. And normal can be anywhere from 2-3 soft bowel movements a day to 1 bowel movement every 2-3 days.

## 2019-10-21 ENCOUNTER — Encounter: Payer: Self-pay | Admitting: Family Medicine

## 2019-10-21 DIAGNOSIS — B029 Zoster without complications: Secondary | ICD-10-CM

## 2019-10-21 MED ORDER — GABAPENTIN 300 MG PO CAPS: ORAL_CAPSULE | ORAL | 0 refills | Status: DC

## 2019-10-25 ENCOUNTER — Ambulatory Visit: Payer: Medicare PPO | Admitting: Primary Care

## 2019-11-01 ENCOUNTER — Encounter: Payer: Self-pay | Admitting: Primary Care

## 2019-11-01 ENCOUNTER — Other Ambulatory Visit: Payer: Self-pay

## 2019-11-01 ENCOUNTER — Ambulatory Visit: Payer: Medicare PPO | Admitting: Primary Care

## 2019-11-01 VITALS — BP 136/82 | HR 90 | Temp 97.4°F | Ht 67.0 in | Wt 236.0 lb

## 2019-11-01 DIAGNOSIS — D696 Thrombocytopenia, unspecified: Secondary | ICD-10-CM

## 2019-11-01 DIAGNOSIS — E042 Nontoxic multinodular goiter: Secondary | ICD-10-CM | POA: Diagnosis not present

## 2019-11-01 DIAGNOSIS — I10 Essential (primary) hypertension: Secondary | ICD-10-CM

## 2019-11-01 DIAGNOSIS — Z0000 Encounter for general adult medical examination without abnormal findings: Secondary | ICD-10-CM

## 2019-11-01 DIAGNOSIS — E119 Type 2 diabetes mellitus without complications: Secondary | ICD-10-CM

## 2019-11-01 DIAGNOSIS — F4323 Adjustment disorder with mixed anxiety and depressed mood: Secondary | ICD-10-CM

## 2019-11-01 DIAGNOSIS — G4733 Obstructive sleep apnea (adult) (pediatric): Secondary | ICD-10-CM

## 2019-11-01 DIAGNOSIS — E0590 Thyrotoxicosis, unspecified without thyrotoxic crisis or storm: Secondary | ICD-10-CM

## 2019-11-01 DIAGNOSIS — Z23 Encounter for immunization: Secondary | ICD-10-CM | POA: Diagnosis not present

## 2019-11-01 DIAGNOSIS — B029 Zoster without complications: Secondary | ICD-10-CM

## 2019-11-01 DIAGNOSIS — B0223 Postherpetic polyneuropathy: Secondary | ICD-10-CM

## 2019-11-01 MED ORDER — FLUOXETINE HCL 40 MG PO CAPS: 40.0000 mg | ORAL_CAPSULE | Freq: Every day | ORAL | 0 refills | Status: DC

## 2019-11-01 NOTE — Assessment & Plan Note (Signed)
Continued, little improvement with gabapentin 300 mg HS and then another dose four hours later.  Discussed to increase to 600 mg HS and update. We also discussed that if needed, she could take an extra 300 mg if she wakes during the night.  She will update.

## 2019-11-01 NOTE — Assessment & Plan Note (Signed)
Healing, but with continued symptoms of neuropathic pain. Rash today with scabbed/crusted vesicles.   Continue gabapentin HS.  Discussed Shingrix vaccine for next year.

## 2019-11-01 NOTE — Progress Notes (Signed)
Subjective:    Patient ID: Courtney Stone, female    DOB: Jun 30, 1940, 79 y.o.   MRN: [id number]  HPI  This visit occurred during the SARS-CoV-2 public health emergency.  Safety protocols were in place, including screening questions prior to the visit, additional usage of staff PPE, and extensive cleaning of exam room while observing appropriate contact time as indicated for disinfecting solutions.   Courtney Stone is a 79 year old female who presents today for complete physical.  She would also like to discuss increasing the dose of her fluoxetine. She endorses daily worry, stressing, feeling anxious, fatigue. She is compliant to her 20 mg dose, but would like to increase.   She is actually taking Metformin XR 1000 mg at night only, no daytime dose. She was once seeing endocrinology for low TSH, has been monitored and stable for years. She did have a NM thyroid update scan in 2019, no follow up since. She denies palpitations, tremor, weight loss. She has noticed an increase in anxiety symptoms.   Immunizations -Influenza: Due today -Shingles: Completed Zostavax in 2011, recently had shingles -Pneumonia: Completed in 2015 and 2020 -Covid-19: Completed series  Diet: She endorses a fair diet.  Exercise: No regular exercise   Eye exam: Completed in 2021 Dental exam: Completes semi-annually   Mammogram: Completed last in 2017 Dexa: Completed in 2017, declines  Colonoscopy: Completed in 2013, declines given age Hep C Screen: Completed in 2021, negative  BP Readings from Last 3 Encounters:  11/01/19 136/82  10/19/19 140/80  10/11/19 (!) 158/90     Review of Systems  Constitutional: Negative for unexpected weight change.  HENT: Negative for rhinorrhea.   Respiratory: Negative for cough and shortness of breath.   Cardiovascular: Negative for chest pain.  Gastrointestinal: Positive for constipation.  Genitourinary: Negative for difficulty urinating.  Musculoskeletal: Positive for  arthralgias.  Skin: Positive for rash.  Allergic/Immunologic: Negative for environmental allergies.  Neurological: Negative for dizziness, numbness and headaches.  Psychiatric/Behavioral: The patient is nervous/anxious.        Past Medical History:  Diagnosis Date  . Anxiety and depression   . Diabetes mellitus   . HTN (hypertension)    120/80's at home  . Thyroid disorder      Social History   Socioeconomic History  . Marital status: Married    Spouse name: Not on file  . Number of children: Not on file  . Years of education: Not on file  . Highest education level: Not on file  Occupational History  . Not on file  Tobacco Use  . Smoking status: Never Smoker  . Smokeless tobacco: Never Used  Vaping Use  . Vaping Use: Never used  Substance and Sexual Activity  . Alcohol use: No  . Drug use: No  . Sexual activity: Never  Other Topics Concern  . Not on file  Social History Narrative   Married.    2 children, 2 grand children.   Lives in [place].   Retired, once worked as a Runner, broadcasting/film/video.    Enjoys spending time with family, reading.    Social Determinants of Health   Financial Resource Strain:   . Difficulty of Paying Living Expenses: Not on file  Food Insecurity:   . Worried About Programme researcher, broadcasting/film/video in the Last Year: Not on file  . Ran Out of Food in the Last Year: Not on file  Transportation Needs:   . Lack of Transportation (Medical): Not on file  . Lack  of Transportation (Non-Medical): Not on file  Physical Activity:   . Days of Exercise per Week: Not on file  . Minutes of Exercise per Session: Not on file  Stress:   . Feeling of Stress : Not on file  Social Connections:   . Frequency of Communication with Friends and Family: Not on file  . Frequency of Social Gatherings with Friends and Family: Not on file  . Attends Religious Services: Not on file  . Active Member of Clubs or Organizations: Not on file  . Attends Banker Meetings: Not on  file  . Marital Status: Not on file  Intimate Partner Violence:   . Fear of Current or Ex-Partner: Not on file  . Emotionally Abused: Not on file  . Physically Abused: Not on file  . Sexually Abused: Not on file    Past Surgical History:  Procedure Laterality Date  . ABDOMINAL HYSTERECTOMY  1976   complete  . BREAST BIOPSY Right    neg  . BREAST SURGERY  1968   benign  . CATARACT EXTRACTION, BILATERAL  2016   [facility]   . NECK SURGERY  1993   ?bone spur removal  . URETHRA SURGERY    . WRIST FRACTURE SURGERY      Family History  Problem Relation Age of Onset  . Cancer Mother        lung  . Cancer Father   . COPD Father   . Diabetes Sister   . Hypertension Sister   . Breast cancer Paternal Grandmother 72    Allergies  Allergen Reactions  . Adhesive [Tape]   . Sulfa Antibiotics     Headache  . Latex Rash    Current Outpatient Medications on File Prior to Visit  Medication Sig Dispense Refill  . acetaminophen (TYLENOL) 500 MG tablet Take 500 mg by mouth every 6 (six) hours as needed for mild pain or moderate pain. Take 1-2 tablets    . FLUoxetine (PROZAC) 20 MG capsule TAKE 1 CAPSULE(20 MG) BY MOUTH DAILY 90 capsule 0  . gabapentin (NEURONTIN) 300 MG capsule Take 300 mg at night on day 1. Then 300 mg twice daily on day 2. If tolerating. Increase to 300 mg three times daily on day 3. 90 capsule 0  . glipiZIDE (GLUCOTROL XL) 10 MG 24 hr tablet TAKE 1 TABLET(10 MG) BY MOUTH DAILY WITH BREAKFAST FOR DIABETES 90 tablet 0  . JANUVIA 100 MG tablet TAKE 1 TABLET(100 MG) BY MOUTH DAILY FOR DIABETES 90 tablet 1  . lisinopril (ZESTRIL) 10 MG tablet TAKE 1 TABLET BY MOUTH EVERY DAY FOR BLOOD PRESSURE 90 tablet 0  . metFORMIN (GLUCOPHAGE-XR) 500 MG 24 hr tablet Take 2 tablets (1,000 mg total) by mouth in the morning and at bedtime. 360 tablet 0  . mometasone (NASONEX) 50 MCG/ACT nasal spray SHAKE LIQUID AND USE 2 SPRAYS NASALLY DAILY 17 g 5  . Multiple  Vitamins-Minerals (CENTRUM SILVER PO) Take 1 tablet by mouth daily.     No current facility-administered medications on file prior to visit.    BP 136/82   Pulse 90   Temp (!) 97.4 F (36.3 C) (Temporal)   Ht 5\' 7"  (1.702 m)   Wt 236 lb (107 kg)   SpO2 99%   BMI 36.96 kg/m    Objective:   Physical Exam HENT:     Right Ear: Tympanic membrane and ear canal normal.     Left Ear: Tympanic membrane and ear canal normal.  Eyes:     Pupils: Pupils are equal, round, and reactive to light.  Cardiovascular:     Rate and Rhythm: Normal rate and regular rhythm.  Pulmonary:     Effort: Pulmonary effort is normal.     Breath sounds: Normal breath sounds.  Abdominal:     General: Bowel sounds are normal.     Palpations: Abdomen is soft.     Tenderness: There is no abdominal tenderness.  Musculoskeletal:        General: Normal range of motion.     Cervical back: Neck supple.  Skin:    General: Skin is warm and dry.     Findings: Rash present.     Comments: Crusted vesicles to left lateral trunk to left thoracic back.   Neurological:     Mental Status: She is alert and oriented to person, place, and time.     Cranial Nerves: No cranial nerve deficit.     Deep Tendon Reflexes:     Reflex Scores:      Patellar reflexes are 2+ on the right side and 2+ on the left side.           Assessment & Plan:

## 2019-11-01 NOTE — Assessment & Plan Note (Signed)
Increased anxiety over the last 1-2 months, overall feels that depression is under good control.  Agree to temporary increase of fluoxetine to 40 mg, she will update in a month via My Chart.

## 2019-11-01 NOTE — Patient Instructions (Addendum)
Please schedule a follow up visit with Dr. Lafe Garin, your endocrinologist, for your thyroid.   You may take two of the gabapentin pills (600 mg) at bedtime for the shingles pain. Please update me as discussed.  We've increased your dose of fluoxetine to 40 mg, I sent a new prescription to the pharmacy.  Resume metformin twice daily as discussed.   Start exercising. You should be getting 150 minutes of exercise weekly.  It's important to improve your diet by reducing consumption of fast food, fried food, processed snack foods, sugary drinks. Increase consumption of fresh vegetables and fruits, whole grains, water.  Ensure you are drinking 64 ounces of water daily.  Please schedule a follow up appointment in 6 months for diabetes check.   It was a pleasure to see you today!    Influenza (Flu) Vaccine (Inactivated or Recombinant): What You Need to Know 1. Why get vaccinated? Influenza vaccine can prevent influenza (flu). Flu is a contagious disease that spreads around the [place] every year, usually between October and May. Anyone can get the flu, but it is more dangerous for some people. Infants and young children, people 27 years of age and older, pregnant women, and people with certain health conditions or a weakened immune system are at greatest risk of flu complications. Pneumonia, bronchitis, sinus infections and ear infections are examples of flu-related complications. If you have a medical condition, such as heart disease, cancer or diabetes, flu can make it worse. Flu can cause fever and chills, sore throat, muscle aches, fatigue, cough, headache, and runny or stuffy nose. Some people may have vomiting and diarrhea, though this is more common in children than adults. Each year thousands of people in the [place] die from flu, and many more are hospitalized. Flu vaccine prevents millions of illnesses and flu-related visits to the doctor each year. 2. Influenza vaccine CDC  recommends everyone 73 months of age and older get vaccinated every flu season. Children 6 months through 74 years of age may need 2 doses during a single flu season. Everyone else needs only 1 dose each flu season. It takes about 2 weeks for protection to develop after vaccination. There are many flu viruses, and they are always changing. Each year a new flu vaccine is made to protect against three or four viruses that are likely to cause disease in the upcoming flu season. Even when the vaccine doesn't exactly match these viruses, it may still provide some protection. Influenza vaccine does not cause flu. Influenza vaccine may be given at the same time as other vaccines. 3. Talk with your health care provider Tell your vaccine provider if the person getting the vaccine:  Has had an allergic reaction after a previous dose of influenza vaccine, or has any severe, life-threatening allergies.  Has ever had Guillain-Barr Syndrome (also called GBS). In some cases, your health care provider may decide to postpone influenza vaccination to a future visit. People with minor illnesses, such as a cold, may be vaccinated. People who are moderately or severely ill should usually wait until they recover before getting influenza vaccine. Your health care provider can give you more information. 4. Risks of a vaccine reaction  Soreness, redness, and swelling where shot is given, fever, muscle aches, and headache can happen after influenza vaccine.  There may be a very small increased risk of Guillain-Barr Syndrome (GBS) after inactivated influenza vaccine (the flu shot). Young children who get the flu shot along with pneumococcal vaccine (PCV13), and/or  DTaP vaccine at the same time might be slightly more likely to have a seizure caused by fever. Tell your health care provider if a child who is getting flu vaccine has ever had a seizure. People sometimes faint after medical procedures, including vaccination.  Tell your provider if you feel dizzy or have vision changes or ringing in the ears. As with any medicine, there is a very remote chance of a vaccine causing a severe allergic reaction, other serious injury, or death. 5. What if there is a serious problem? An allergic reaction could occur after the vaccinated person leaves the clinic. If you see signs of a severe allergic reaction (hives, swelling of the face and throat, difficulty breathing, a fast heartbeat, dizziness, or weakness), call 9-1-1 and get the person to the nearest hospital. For other signs that concern you, call your health care provider. Adverse reactions should be reported to the Vaccine Adverse Event Reporting System (VAERS). Your health care provider will usually file this report, or you can do it yourself. Visit the VAERS website at www.vaers.LAgents.no or call (403) 583-7372.VAERS is only for reporting reactions, and VAERS staff do not give medical advice. 6. The National Vaccine Injury Compensation Program The [employer] Vaccine Injury Compensation Program (VICP) is a federal program that was created to compensate people who may have been injured by certain vaccines. Visit the VICP website at SpiritualWord.at or call (204)525-4598 to learn about the program and about filing a claim. There is a time limit to file a claim for compensation. 7. How can I learn more?  Ask your healthcare provider.  Call your local or state health department.  Contact the Centers for Disease Control and Prevention (CDC): ? Call (563) 658-6973 (1-800-CDC-INFO) or ? Visit CDC's BiotechRoom.com.cy Vaccine Information Statement (Interim) Inactivated Influenza Vaccine (09/04/2017) This information is not intended to replace advice given to you by your health care provider. Make sure you discuss any questions you have with your health care provider. Document Revised: 04/28/2018 Document Reviewed: 09/08/2017 Elsevier Patient Education  2020  [employer].

## 2019-11-01 NOTE — Assessment & Plan Note (Signed)
No follow up with endocrinology given Covid-19 pandemic last year. Strongly advised she schedule an appointment, she will do so.

## 2019-11-01 NOTE — Assessment & Plan Note (Signed)
Stable in the office today, continue lisinopril 10 mg. CMP reviewed.

## 2019-11-01 NOTE — Assessment & Plan Note (Signed)
Will discuss Shingrix for next year given active shingles case now. Influenza vaccine provided today.  Declines mammogram and bone density scan. Colonoscopy N/A given age.  Encouraged regular exercise, healthy diet.  Exam today stable. Labs reviewed.

## 2019-11-01 NOTE — Assessment & Plan Note (Signed)
Chronic which seems to date back to at least 2013. CBC from 2013 of 131. Discussed further evaluation of lower platelet count via referral to hematology, she declines.   She has been stable for years. Will continue to monitor.

## 2019-11-01 NOTE — Assessment & Plan Note (Signed)
Recent level of 7.6 which is overall improved. She is not taking metformin BID due to recent shingles diagnosis.  Discussed to resume metformin BID. Continue Januvia and Glipizide.  Follow up in 6 months.

## 2019-11-01 NOTE — Assessment & Plan Note (Signed)
Recent TSH of 0.07, no recent follow up with endocrinology who has been monitoring this for years. Strongly advised she set this up, she will do so.

## 2019-11-01 NOTE — Assessment & Plan Note (Signed)
Compliant to CPAP machine nightly, recently CPAP was recalled, has a new machine.

## 2019-11-06 ENCOUNTER — Other Ambulatory Visit: Payer: Self-pay | Admitting: Primary Care

## 2019-11-06 DIAGNOSIS — E119 Type 2 diabetes mellitus without complications: Secondary | ICD-10-CM

## 2019-11-26 DIAGNOSIS — G4733 Obstructive sleep apnea (adult) (pediatric): Secondary | ICD-10-CM | POA: Diagnosis not present

## 2019-11-29 ENCOUNTER — Ambulatory Visit (INDEPENDENT_AMBULATORY_CARE_PROVIDER_SITE_OTHER): Payer: Medicare PPO | Admitting: Internal Medicine

## 2019-11-29 VITALS — BP 134/72 | HR 75 | Ht 67.0 in | Wt 236.0 lb

## 2019-11-29 DIAGNOSIS — E669 Obesity, unspecified: Secondary | ICD-10-CM

## 2019-11-29 DIAGNOSIS — Z7189 Other specified counseling: Secondary | ICD-10-CM | POA: Diagnosis not present

## 2019-11-29 DIAGNOSIS — Z9989 Dependence on other enabling machines and devices: Secondary | ICD-10-CM | POA: Diagnosis not present

## 2019-11-29 DIAGNOSIS — G4733 Obstructive sleep apnea (adult) (pediatric): Secondary | ICD-10-CM | POA: Diagnosis not present

## 2019-11-29 NOTE — Progress Notes (Signed)
[facility] [address] [place], [place] [address]  Pulmonary Sleep Medicine   Office Visit Note  Patient Name: Courtney Stone DOB: 1940-05-03 MRN [id number]    Chief Complaint: Obstructive Sleep Apnea visit  Brief History:  Courtney Stone is seen today for follow up. She recently received a replacement CPAP and restarted therapy. The patient has a 5 year history of sleep apnea. Patient is using PAP nightly.  The patient feels more reste after sleeping with PAP.  The patient reports benefiting from PAP use. Reported sleepiness is  improved and the Epworth Sleepiness Score is 4 out of 24. The patient does not take naps. The patient complains of the following: dry mouth  The compliance download shows excellent compliance with an average use time of 6.6 hours. The AHI is 0.8  The patient does not of limb movements disrupting sleep.  ROS  General: (-) fever, (-) chills, (-) night sweat Nose and Sinuses: (-) nasal stuffiness or itchiness, (-) postnasal drip, (-) nosebleeds, (-) sinus trouble. Mouth and Throat: (-) sore throat, (-) hoarseness. Neck: (-) swollen glands, (-) enlarged thyroid, (-) neck pain. Respiratory: - cough, - shortness of breath, - wheezing. Neurologic: - numbness, - tingling. Psychiatric: - anxiety, - depression   Current Medication: Outpatient Encounter Medications as of 11/29/2019  Medication Sig  . acetaminophen (TYLENOL) 500 MG tablet Take 500 mg by mouth every 6 (six) hours as needed for mild pain or moderate pain. Take 1-2 tablets  . FLUoxetine (PROZAC) 40 MG capsule Take 1 capsule (40 mg total) by mouth daily. For anxiety and depression.  . gabapentin (NEURONTIN) 300 MG capsule Take 300 mg at night on day 1. Then 300 mg twice daily on day 2. If tolerating. Increase to 300 mg three times daily on day 3.  . glipiZIDE (GLUCOTROL XL) 10 MG 24 hr tablet TAKE 1 TABLET(10 MG) BY MOUTH DAILY WITH BREAKFAST FOR DIABETES  . JANUVIA 100 MG tablet TAKE 1  TABLET(100 MG) BY MOUTH DAILY FOR DIABETES  . lisinopril (ZESTRIL) 10 MG tablet TAKE 1 TABLET BY MOUTH EVERY DAY FOR BLOOD PRESSURE  . metFORMIN (GLUCOPHAGE-XR) 500 MG 24 hr tablet Take 2 tablets (1,000 mg total) by mouth in the morning and at bedtime.  . mometasone (NASONEX) 50 MCG/ACT nasal spray SHAKE LIQUID AND USE 2 SPRAYS NASALLY DAILY  . Multiple Vitamins-Minerals (CENTRUM SILVER PO) Take 1 tablet by mouth daily.   No facility-administered encounter medications on file as of 11/29/2019.    Surgical History: Past Surgical History:  Procedure Laterality Date  . ABDOMINAL HYSTERECTOMY  1976   complete  . BREAST BIOPSY Right    neg  . BREAST SURGERY  1968   benign  . CATARACT EXTRACTION, BILATERAL  2016   [facility]   . NECK SURGERY  1993   ?bone spur removal  . URETHRA SURGERY    . WRIST FRACTURE SURGERY      Medical History: Past Medical History:  Diagnosis Date  . Anxiety and depression   . Diabetes mellitus   . HTN (hypertension)    120/80's at home  . Thyroid disorder     Family History: Non contributory to the present illness  Social History: Social History   Socioeconomic History  . Marital status: Married    Spouse name: Not on file  . Number of children: Not on file  . Years of education: Not on file  . Highest education level: Not on file  Occupational History  . Not  on file  Tobacco Use  . Smoking status: Never Smoker  . Smokeless tobacco: Never Used  Vaping Use  . Vaping Use: Never used  Substance and Sexual Activity  . Alcohol use: No  . Drug use: No  . Sexual activity: Never  Other Topics Concern  . Not on file  Social History Narrative   Married.    2 children, 2 grand children.   Lives in [place].   Retired, once worked as a Runner, broadcasting/film/video.    Enjoys spending time with family, reading.    Social Determinants of Health   Financial Resource Strain:   . Difficulty of Paying Living Expenses: Not on file  Food Insecurity:    . Worried About Programme researcher, broadcasting/film/video in the Last Year: Not on file  . Ran Out of Food in the Last Year: Not on file  Transportation Needs:   . Lack of Transportation (Medical): Not on file  . Lack of Transportation (Non-Medical): Not on file  Physical Activity:   . Days of Exercise per Week: Not on file  . Minutes of Exercise per Session: Not on file  Stress:   . Feeling of Stress : Not on file  Social Connections:   . Frequency of Communication with Friends and Family: Not on file  . Frequency of Social Gatherings with Friends and Family: Not on file  . Attends Religious Services: Not on file  . Active Member of Clubs or Organizations: Not on file  . Attends Banker Meetings: Not on file  . Marital Status: Not on file  Intimate Partner Violence:   . Fear of Current or Ex-Partner: Not on file  . Emotionally Abused: Not on file  . Physically Abused: Not on file  . Sexually Abused: Not on file    Vital Signs: Blood pressure 134/72, pulse 75, height 5\' 7"  (1.702 m), weight 236 lb (107 kg), SpO2 99 %.  Examination: General Appearance: The patient is well-developed, well-nourished, and in no distress. Neck Circumference: 40 Skin: Gross inspection of skin unremarkable. Head: normocephalic, no gross deformities. Eyes: no gross deformities noted. ENT: ears appear grossly normal Neurologic: Alert and oriented. No involuntary movements.    EPWORTH SLEEPINESS SCALE:  Scale:  (0)= no chance of dozing; (1)= slight chance of dozing; (2)= moderate chance of dozing; (3)= high chance of dozing  Chance  Situtation    Sitting and reading: 39    Watching TV: 2    Sitting Inactive in public: 1    As a passenger in car: 0      Lying down to rest: 1    Sitting a/nd talking: 0    Sitting quielty after lunch: 0    In a car, stopped in traffic: 0   TOTAL SCORE:   4 out of 24    SLEEP STUDIES:  1. HST 09/2014 REI 15 SpO8min 81%   CPAP COMPLIANCE DATA:  Date  Range: 10/26/19-11/24/19  Average Daily Use: 6.6 hours  Median Use: 6.5  Compliance for > 4 Hours: 90%  AHI: 0.8 respiratory events per hour  Days Used: 27/30  Mask Leak: 44.8  95th Percentile Pressure: 9.7  LABS: Recent Results (from the past 2160 hour(s))  Hemoglobin A1c     Status: Abnormal   Collection Time: 10/11/19 12:47 PM  Result Value Ref Range   Hgb A1c MFr Bld 7.6 (H) 4.6 - 6.5 %    Comment: Glycemic Control Guidelines for People with Diabetes:Non Diabetic:  <6%Goal of Therapy: <  7%Additional Action Suggested:  >8%   Hepatitis C antibody     Status: None   Collection Time: 10/11/19 12:47 PM  Result Value Ref Range   Hepatitis C Ab NON-REACTIVE NON-REACTI   SIGNAL TO CUT-OFF 0.02 <1.00    Comment: . HCV antibody was non-reactive. There is no laboratory  evidence of HCV infection. . In most cases, no further action is required. However, if recent HCV exposure is suspected, a test for HCV RNA (test code 31497) is suggested. . For additional information please refer to http://education.questdiagnostics.com/faq/FAQ22v1 (This link is being provided for informational/ educational purposes only.) .   TSH     Status: Abnormal   Collection Time: 10/11/19 12:47 PM  Result Value Ref Range   TSH 0.07 (L) 0.35 - 4.50 uIU/mL  CBC     Status: Abnormal   Collection Time: 10/11/19 12:47 PM  Result Value Ref Range   WBC 6.8 4.0 - 10.5 K/uL   RBC 4.88 3.87 - 5.11 Mil/uL   Platelets 125.0 (L) 150 - 400 K/uL   Hemoglobin 15.0 12.0 - 15.0 g/dL   HCT 02.6 36 - 46 %   MCV 89.6 78.0 - 100.0 fl   MCHC 34.2 30.0 - 36.0 g/dL   RDW 37.8 58.8 - 50.2 %  Comprehensive metabolic panel     Status: Abnormal   Collection Time: 10/11/19 12:47 PM  Result Value Ref Range   Sodium 136 135 - 145 mEq/L   Potassium 3.8 3.5 - 5.1 mEq/L   Chloride 101 96 - 112 mEq/L   CO2 27 19 - 32 mEq/L   Glucose, Bld 113 (H) 70 - 99 mg/dL   BUN 11 6 - 23 mg/dL   Creatinine, Ser 7.74 0.40 - 1.20 mg/dL    Total Bilirubin 1.2 0.2 - 1.2 mg/dL   Alkaline Phosphatase 68 39 - 117 U/L   AST 21 0 - 37 U/L   ALT 23 0 - 35 U/L   Total Protein 6.6 6.0 - 8.3 g/dL   Albumin 4.2 3.5 - 5.2 g/dL   GFR 12.87 >86.76 mL/min   Calcium 9.8 8.4 - 10.5 mg/dL  Lipid panel     Status: None   Collection Time: 10/11/19 12:47 PM  Result Value Ref Range   Cholesterol 92 0 - 200 mg/dL    Comment: ATP III Classification       Desirable:  < 200 mg/dL               Borderline High:  200 - 239 mg/dL          High:  > = 720 mg/dL   Triglycerides 94.7 0 - 149 mg/dL    Comment: Normal:  <096 mg/dLBorderline High:  150 - 199 mg/dL   HDL 28.36 >62.94 mg/dL   VLDL 76.5 0.0 - 46.5 mg/dL   LDL Cholesterol 33 0 - 99 mg/dL   Total CHOL/HDL Ratio 2     Comment:                Men          Women1/2 Average Risk     3.4          3.3Average Risk          5.0          4.42X Average Risk          9.6          7.13X Average Risk  15.0          11.0                       NonHDL 50.53     Comment: NOTE:  Non-HDL goal should be 30 mg/dL higher than patient's LDL goal (i.e. LDL goal of < 70 mg/dL, would have non-HDL goal of < 100 mg/dL)    Radiology: NM THYROID MULT UPTAKE W/IMAGING  Result Date: 09/16/2017 CLINICAL DATA:  Thyrotoxicosis without thyroid storm EXAM: THYROID SCAN AND UPTAKE - 4 AND 24 HOURS TECHNIQUE: Following oral administration of I-123 capsule, anterior planar imaging was acquired at 24 hours. Thyroid uptake was calculated with a thyroid probe at 4-6 hours and 24 hours. RADIOPHARMACEUTICALS:  290 uCi I-123 sodium iodide p.o. COMPARISON:  None FINDINGS: Small RIGHT thyroid lobe versus LEFT versus cold nodule at inferior pole RIGHT lobe. No additional areas of increased or decreased tracer localization. 6 hour I-123 uptake = 11.8% (normal 5-20%) 24 hour I-123 uptake = 35.0% (normal 10-30%) IMPRESSION: Mildly elevated 24 hour radio iodine uptake consistent with hyperthyroidism. Question asymmetric thyroid lobes  smaller on RIGHT versus cold nodule at inferior pole RIGHT lobe; thyroid ultrasound recommended to exclude inferior pole RIGHT lobe mass. Electronically Signed   By: Ulyses SouthwardMark  Boles M.D.   On: 09/16/2017 11:26    No results found.  No results found.    Assessment and Plan: Patient Active Problem List   Diagnosis Date Noted  . 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 Drug-induced constipation 10/19/2019  . Herpes zoster without complication 10/11/2019  . Abdominal pain 10/05/2019  . Herpes simplex 09/16/2017  . Preventative health care 09/16/2017  . Multinodular goiter 11/15/2016  . Adjustment disorder with mixed anxiety and depressed mood 02/01/2015  . Obstructive sleep apnea 10/26/2014  . Left shoulder pain 09/13/2014  . Chronic fatigue 09/13/2014  . Medicare annual wellness visit, subsequent 02/05/2013  . Thrombocytopenia (HCC) 09/30/2011  . Type 2 diabetes mellitus (HCC) 07/29/2011  . Thyrotoxicosis 07/29/2011  . Obesity (BMI 30-39.9) 06/21/2011  . Hypertension 12/18/2010      The patient does tolerate PAP and reports significant benefit from PAP use. The patient was reminded how to adjust the humidifier setting and the ramp time and advised to try xylimelts if the dry mouth continues. The patient was also counselled on the importance of weight loss in treating sleep apena. The compliance is excellent. The apnea is very well controlled.   1. OSA- continue excellent compliance. 2. CPAP couseling-Discussed importance of adequate CPAP use as well as proper care and cleaning techniques of machine and all supplies. 3. BMI 36.9-Discussed the importance of weight management through healthy eating and daily exercise as tolerated. Discussed the negative effects obesity has on pulmonary health, cardiac health as well as overall general health and well being.  General Counseling: I have discussed the findings of the evaluation and examination with Prudie.  I have also discussed any  further diagnostic evaluation thatmay be needed or ordered today. Jennavieve verbalizes understanding of the findings of todays visit. We also reviewed her medications today and discussed drug interactions and side effects including but not limited excessive drowsiness and altered mental states. We also discussed that there is always a risk not just to her but also people around her. she has been encouraged to call the office with any questions or concerns that should arise related to todays visit.  I have personally obtained a history, examined the patient, evaluated  laboratory and imaging results, formulated the assessment and plan and placed orders.  This patient was seen by Theotis Burrow, AGNP-C in collaboration with Dr. Freda Munro as a part of collaborative care agreement.  Valentino Hue Sol Blazing, PhD, FAASM  Diplomate, American Board of Sleep Medicine    Yevonne Pax, MD [facility] Diplomate ABMS Pulmonary and Critical Care Medicine Sleep medicine

## 2019-11-29 NOTE — Patient Instructions (Signed)

## 2019-12-05 ENCOUNTER — Other Ambulatory Visit: Payer: Self-pay | Admitting: Primary Care

## 2019-12-05 DIAGNOSIS — F4323 Adjustment disorder with mixed anxiety and depressed mood: Secondary | ICD-10-CM

## 2019-12-05 DIAGNOSIS — I10 Essential (primary) hypertension: Secondary | ICD-10-CM

## 2019-12-05 DIAGNOSIS — E119 Type 2 diabetes mellitus without complications: Secondary | ICD-10-CM

## 2019-12-06 ENCOUNTER — Other Ambulatory Visit: Payer: Self-pay | Admitting: Primary Care

## 2019-12-06 DIAGNOSIS — E119 Type 2 diabetes mellitus without complications: Secondary | ICD-10-CM

## 2019-12-09 DIAGNOSIS — E041 Nontoxic single thyroid nodule: Secondary | ICD-10-CM | POA: Diagnosis not present

## 2019-12-09 DIAGNOSIS — H6121 Impacted cerumen, right ear: Secondary | ICD-10-CM | POA: Diagnosis not present

## 2019-12-09 DIAGNOSIS — H6061 Unspecified chronic otitis externa, right ear: Secondary | ICD-10-CM | POA: Diagnosis not present

## 2019-12-26 DIAGNOSIS — G4733 Obstructive sleep apnea (adult) (pediatric): Secondary | ICD-10-CM | POA: Diagnosis not present

## 2019-12-28 ENCOUNTER — Other Ambulatory Visit (INDEPENDENT_AMBULATORY_CARE_PROVIDER_SITE_OTHER): Payer: Medicare PPO

## 2019-12-28 ENCOUNTER — Encounter: Payer: Self-pay | Admitting: Internal Medicine

## 2019-12-28 ENCOUNTER — Other Ambulatory Visit: Payer: Self-pay

## 2019-12-28 ENCOUNTER — Ambulatory Visit: Payer: Medicare PPO | Admitting: Internal Medicine

## 2019-12-28 VITALS — BP 128/72 | HR 74 | Wt 240.8 lb

## 2019-12-28 DIAGNOSIS — E0590 Thyrotoxicosis, unspecified without thyrotoxic crisis or storm: Secondary | ICD-10-CM | POA: Diagnosis not present

## 2019-12-28 DIAGNOSIS — E042 Nontoxic multinodular goiter: Secondary | ICD-10-CM | POA: Diagnosis not present

## 2019-12-28 NOTE — Patient Instructions (Addendum)
Please stop at the lab.  If you are not called about the Thyroid Uptake, please call [facility] and try to discuss with the Nuclear Medicine Department.  After the treatemnt, we will need labs in 1 month.  Please come back for a follow-up appointment in 3 months  Radioiodine (I-131) Therapy for Hyperthyroidism Radioiodine (I-131) therapy is a treatment for an overactive thyroid gland (hyperthyroidism). The thyroid is a gland in the neck that uses iodine to help control how the body uses food (metabolism). This treatment involves swallowing a pill or liquid that contains I-131. I-131 is manufactured (synthetic) iodine that gives off radiation. After it is swallowed, the I-131 will be absorbed by the thyroid gland over the next few months. It will destroy thyroid cells and reverse hyperthyroidism. Tell a health care provider about:  Any allergies you have.  All medicines you are taking, including vitamins, herbs, eye drops, creams, and over-the-counter medicines.  Any blood disorders you have.  Any surgeries you have had.  Any medical conditions you have.  Whether you are pregnant, may be pregnant, or have gone through menopause, if this applies.  Whether you currently have children.  Whether you are breastfeeding.  Whether you plan to have children in the next 2 years.  Any contact you have with children or pregnant women.  Your travel plans for the next 3 months.  Whether you pass through radiation detectors for work or travel. What are the risks? Generally, this is a safe procedure. However, problems may occur, including:  Damage to other structures or organs, such as the salivary glands. This could lead to dry mouth and loss of taste.  Low sperm count, if this applies. This may lead to temporary infertility.  Sore throat or neck pain. This is temporary.  Slightly increased risk of thyroid cancer.  Nausea or vomiting. What happens before the procedure? Staying  hydrated  Follow instructions from your health care provider about hydration, which may include: ? Up to 2 hours before the procedure - you may continue to drink clear liquids, such as water, clear fruit juice, black coffee, and plain tea. Eating and drinking restrictions  Follow instructions from your health care provider about eating and drinking restrictions.  Follow a low-iodine diet as told by your health care provider. Check ingredients on packaged foods and drinks because there are foods that you will need to avoid while on the low-iodine diet: ? Avoid iodized table salt and foods that have iodized salt. ? Avoid seafood, seaweed, soybeans, and soy products. ? Avoid dairy products and eggs. ? Avoid the food dye Red No. 3 because it has iodine. Medicines   Ask your health care provider about: ? Changing or stopping your regular medicines. This is especially important if you are taking diabetes medicines, blood thinners, or thyroid medicines. ? Taking over-the-counter medicines, vitamins, herbs, and supplements. General instructions  Women may be asked to take a pregnancy test.  Women who are breastfeeding should: ? Plan to stop at least 6 weeks before the procedure. ? Not go back to breastfeeding after the procedure until their health care provider approves.  Plan to avoid contact with other people for 1 week after your treatment. Avoiding contact with children and pregnant women is especially important. To do this, plan to stay home from work, arrange child care, and sleep alone, if these things apply to you.  Plan to drive yourself home after treatment. Do not take public transportation. If you need someone to drive  you home, sit as far away from the driver as possible. What happens during the procedure?  You will be given a dose of I-131 to swallow. It may be a pill or a liquid.  Your thyroid gland will absorb the I-131 over the next 3 months. The treatment process will be  complete in about 6 months. What happens after the procedure?  You may need to stay in the hospital for 24 hours after your treatment. This depends on the requirements in your state.  Follow instructions from your health care provider about: ? How to take care of yourself after the procedure. ? How to protect others from exposure to radiation as it leaves your body. Summary  Radioiodine (I-131) therapy is a treatment for an overactive thyroid gland (hyperthyroidism).  This treatment involves swallowing a pill or liquid that contains I-131. I-131 is manufactured iodine that gives off radiation.  Your thyroid gland will absorb the I-131 over the next 3 months. The I-131 destroys thyroid cells and reverses hyperthyroidism.  Follow instructions from your health care provider about how to take care of yourself and how to protect other people from exposure to radiation after the procedure. This information is not intended to replace advice given to you by your health care provider. Make sure you discuss any questions you have with your health care provider. Document Revised: 02/19/2018 Document Reviewed: 02/19/2018 Elsevier Patient Education  2020 [employer]. .  Radioiodine (I-131) Therapy for Hyperthyroidism, Care After This sheet gives you information about how to care for yourself after your procedure. Your health care provider may also give you more specific instructions. If you have problems or questions, contact your health care provider. What can I expect after the procedure? After the procedure, it is common to have a sore throat or mild neck pain for several months. Follow these instructions at home: For the first 48 hours after the procedure:   Do not use public bathrooms.  Flush twice after using the toilet. Use tissue to wipe up any urine on the toilet seat. Men should sit down to urinate to reduce the risk of splashing.  Take a bath or shower every day.  Rinse the sink  and tub after each use.  Do not make food for other people.  Wash your sheets, towels, and clothes each day. Wash them separately from other people's items.  Drink enough fluid to keep your urine pale yellow. Pregnancy and breastfeeding  Do not try to become pregnant for as long as you are told by your health care provider. This may be for up to 1 year after your procedure. Use a method of birth control (contraception) to prevent pregnancy. Talk to your health care provider about what form of contraception is right for you.  Do not breastfeed for as long as you are told by your health care provider, if this applies. General instructions   Avoid close contact with other people. Avoiding contact with children and pregnant women is especially important. Do this for 1 week after your procedure. ? Try to keep a distance of about 6 feet (1.8 m) from others. ? Sleep alone. ? Do not have close intimate contact of any kind. This includes kissing, physical contact, and sexual intercourse. ? If you have children, arrange for child care. ? Do not ride in vehicles with other people. ? Do not stay in a hotel. ? Stay home from work or school as told by your health care provider.  Take over-the-counter and  prescription medicines only as told by your health care provider. This includes any thyroid medicines.  When your health care provider tells you it is safe to travel, carry a note from the health care provider to explain that you have had radioiodine therapy. This is needed because radioactivity may set off detectors in airports or other places.  Do not share utensils--such as silverware, plates, or cups--for as long as told by your health care provider. Use disposable utensils, or clean your utensils separately from those of others.  Wash your hands often. If soap and water are not available, use hand sanitizer.  Keep all follow-up visits as told by your health care provider. This is important.  Follow-up visits may be required every 1-2 months after treatment. Contact a health care provider if you:  Have pain that gets worse or does not get better with medicine.  Have a dry mouth.  Lose your sense of taste.  Become pregnant within 1 year of your procedure, if this applies.  Feel unusually tired (fatigued).  Have very dry skin.  Start to lose your hair.  Have bowel movements that are less frequent or more difficult than usual (constipation).  Have unexplained weight gain.  Always feel cold. Summary  After the procedure, it is common to have a sore throat or mild neck pain for several months.  Do not try to become pregnant for as long as you are told by your health care provider.  Avoid close contact with other people. Avoiding contact with children and pregnant women is especially important. Do this for 1 week after your procedure.  Keep all follow-up visits as told by your health care provider. This is important. This information is not intended to replace advice given to you by your health care provider. Make sure you discuss any questions you have with your health care provider. Document Revised: 02/19/2018 Document Reviewed: 02/19/2018 Elsevier Patient Education  2020 [employer].

## 2019-12-28 NOTE — Progress Notes (Signed)
Patient ID: Courtney Stone, female   DOB: 1940/11/30, 79 y.o.   MRN: [id number]   This visit occurred during the SARS-CoV-2 public health emergency.  Safety protocols were in place, including screening questions prior to the visit, additional usage of staff PPE, and extensive cleaning of exam room while observing appropriate contact time as indicated for disinfecting solutions.   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  Last visit 2 years and 4 months ago.  Reviewed her TFTs: Lab Results  Component Value Date   TSH 0.07 (L) 10/11/2019   TSH 0.11 (L) 10/15/2018   TSH 0.09 (L) 08/18/2017   TSH 0.36 02/20/2017   TSH 0.40 11/15/2016   TSH 0.08 (L) 09/12/2016   TSH 0.25 (L) 05/31/2015   TSH 0.32 (L) 09/13/2014   TSH 0.42 06/13/2014   TSH 0.45 12/18/2011   FREET4 0.90 08/18/2017   FREET4 0.80 02/20/2017   FREET4 0.76 11/15/2016   FREET4 0.78 12/18/2011   FREET4 0.79 09/26/2011    Lab Results  Component Value Date   T3FREE 3.5 08/18/2017   T3FREE 2.9 02/20/2017   T3FREE 3.3 11/15/2016   Thyroid U/S (01/2010): MNG. Followed for nodules for the last 40 years.   Thyroid uptake and scan (09/16/2017): Small RIGHT thyroid lobe versus LEFT versus cold nodule at inferior pole RIGHT lobe. No additional areas of increased or decreased tracer localization. 6 hour I-123 uptake = 11.8% (normal 5-20%) 24 hour I-123 uptake = 35.0% (normal 10-30%)  IMPRESSION: Mildly elevated 24 hour radio iodine uptake consistent with hyperthyroidism.  Question asymmetric thyroid lobes smaller on RIGHT versus cold nodule at inferior pole RIGHT lobe; thyroid ultrasound recommended to exclude inferior pole RIGHT lobe mass.  At last visit, we obtained the above thyroid uptake and scan in preparation for RAI treatment.  However, she was lost for follow-up afterwards...  Pt denies: - feeling nodules  in neck - hoarseness - dysphagia - choking - SOB with lying down  Pt does not have a FH of thyroid ds: mother. No FH of thyroid cancer. No h/o radiation tx to head or neck.  No recent contrast studies. No herbal supplements. No Biotin use. No recent steroids use.   She also has a history of type 2 diabetes, managed by PCP.  Reviewed HbA1c levels: Lab Results  Component Value Date   HGBA1C 7.6 (H) 10/11/2019   HGBA1C 7.9 (A) 07/15/2019   HGBA1C 8.3 (A) 04/14/2019   HGBA1C 8.8 (H) 10/15/2018   HGBA1C 8.1 (A) 03/19/2018   HGBA1C 7.4 (A) 08/18/2017   HGBA1C 7.9 (H) 03/18/2017   HGBA1C 7.9 (H) 12/17/2016   HGBA1C 7.9 (H) 09/12/2016   HGBA1C 7.4 (H) 03/13/2016   Pt is on a regimen of: - Metformin ER 1000 mg 2x a day, with meals - Glipizide XL 10 mg before breakfast - Januvia 100 mg before breakfast  ROS: Constitutional: no weight gain/no weight loss, no fatigue, no subjective hyperthermia, no subjective hypothermia Eyes: no blurry vision, no xerophthalmia ENT: no sore throat, + see HPI Cardiovascular: no CP/no SOB/no palpitations/no leg swelling Respiratory: no cough/no SOB/no wheezing Gastrointestinal: no N/no V/no D/no C/no acid reflux Musculoskeletal: no muscle aches/no joint aches Skin: no rashes, no hair loss Neurological: no tremors/no numbness/no tingling/no dizziness  I reviewed pt's medications, allergies, PMH, social hx, family hx, and changes were documented in the history of present illness. Otherwise, unchanged from my initial visit  note.  Past Medical History:  Diagnosis Date  . Anxiety and depression   . Diabetes mellitus   . HTN (hypertension)    120/80's at home  . Thyroid disorder    Past Surgical History:  Procedure Laterality Date  . ABDOMINAL HYSTERECTOMY  1976   complete  . BREAST BIOPSY Right    neg  . BREAST SURGERY  1968   benign  . CATARACT EXTRACTION, BILATERAL  2016   [facility]   . NECK SURGERY  1993   ?bone spur removal   . URETHRA SURGERY    . WRIST FRACTURE SURGERY     Social History   Social History Main Topics  . Smoking status: Never Smoker  . Smokeless tobacco: Never Used  . Alcohol use No  . Drug use: No   Social History Narrative   Married.    2 children, 2 grand children.   Lives in [place].   Retired, once worked as a Runner, broadcasting/film/video.    Enjoys spending time with family, reading.    Current Outpatient Medications on File Prior to Visit  Medication Sig Dispense Refill  . acetaminophen (TYLENOL) 500 MG tablet Take 500 mg by mouth every 6 (six) hours as needed for mild pain or moderate pain. Take 1-2 tablets    . FLUoxetine (PROZAC) 20 MG capsule TAKE 1 CAPSULE(20 MG) BY MOUTH DAILY 90 capsule 0  . FLUoxetine (PROZAC) 40 MG capsule Take 1 capsule (40 mg total) by mouth daily. For anxiety and depression. 90 capsule 0  . gabapentin (NEURONTIN) 300 MG capsule Take 300 mg at night on day 1. Then 300 mg twice daily on day 2. If tolerating. Increase to 300 mg three times daily on day 3. 90 capsule 0  . glipiZIDE (GLUCOTROL XL) 10 MG 24 hr tablet TAKE 1 TABLET(10 MG) BY MOUTH DAILY WITH BREAKFAST FOR DIABETES 90 tablet 0  . JANUVIA 100 MG tablet TAKE 1 TABLET(100 MG) BY MOUTH DAILY FOR DIABETES 90 tablet 1  . lisinopril (ZESTRIL) 10 MG tablet TAKE 1 TABLET BY MOUTH EVERY DAY FOR BLOOD PRESSURE 90 tablet 0  . metFORMIN (GLUCOPHAGE-XR) 500 MG 24 hr tablet TAKE 2 TABLETS(1000 MG) BY MOUTH IN THE MORNING AND AT BEDTIME 360 tablet 0  . mometasone (NASONEX) 50 MCG/ACT nasal spray SHAKE LIQUID AND USE 2 SPRAYS NASALLY DAILY 17 g 5  . Multiple Vitamins-Minerals (CENTRUM SILVER PO) Take 1 tablet by mouth daily.     No current facility-administered medications on file prior to visit.   Allergies  Allergen Reactions  . Adhesive [Tape]   . Sulfa Antibiotics     Headache  . Latex Rash   Family History  Problem Relation Age of Onset  . Cancer Mother        lung  . Cancer Father   . COPD Father   .  Diabetes Sister   . Hypertension Sister   . Breast cancer Paternal Grandmother 61    PE: BP 128/72   Pulse 74   Wt 240 lb 12.8 oz (109.2 kg)   BMI 37.71 kg/m  Wt Readings from Last 3 Encounters:  12/28/19 240 lb 12.8 oz (109.2 kg)  11/29/19 236 lb (107 kg)  11/01/19 236 lb (107 kg)   Constitutional: overweight, in NAD Eyes: PERRLA, EOMI, no exophthalmos ENT: moist mucous membranes, no thyromegaly, no cervical lymphadenopathy Cardiovascular: RRR, No MRG Respiratory: CTA B Gastrointestinal: abdomen soft, NT, ND, BS+ Musculoskeletal: no deformities, strength intact in all 4 Skin: moist, warm,  no rashes Neurological: no tremor with outstretched hands, DTR normal in all 4  ASSESSMENT: 1. Thyrotoxicosis  2. MNG  PLAN:  1. Patient with a history of low TSH at least since 2013, with a few thyrotoxic symptoms including heat intolerance, hyper defecation, anxiety, but without tremors, palpitations, weight loss.  She has a history of multinodular goiter on ultrasound.  She had negative Graves' antibodies. -She returns after long absence of more than 2 years.  She was referred back to endocrinology as her TSH was repeatedly very low. -At last visit, we checked a thyroid uptake and scan and this showed mild diffuse hyperthyroidism.  She had what appeared to be small cold nodules, previously investigated. -Of note, she has no thyrotoxic symptoms -At today's visit, we discussed about repeating her TFTs and proceed with RAI treatment.  She agrees to have this.  Before the RAI ablation, she will need another RAI uptake.  We'll order this.  Discussed about posttreatment precautions and given her written instructions about I-131 treatment. -She is not on any medication for her thyrotoxicosis.  We may need to add methimazole provisionally, depending on her TFTs today. -I will have her return in 3 months.  2. MNG  -Patient with long history of thyroid nodules, for 40 years, followed closely with  ENT, with stability of nodules over time -She denies neck compression symptoms -One of her nodules remains palpable in the anterior neck but not changed over the years per her report -We we'll continue to follow her clinically for these  Patient Instructions  Please stop at the lab.  If you are not called about the Thyroid Uptake, please call [facility] and try to discuss with the Nuclear Medicine Department.  After the treatmentt, we will need labs in 1 month.  Please come back for a follow-up appointment in 3 months  Component     Latest Ref Rng & Units 12/28/2019  TSH     0.35 - 4.50 uIU/mL 0.19 (L)  T4,Free(Direct)     0.60 - 1.60 ng/dL 2.99  Triiodothyronine,Free,Serum     2.3 - 4.2 pg/mL 3.1  Continues to have thyrotoxicosis (now subclinical).  We will continue to decline to proceed with RAI treatment, as discussed.  We do not absolutely need to start her on MMI before the treatment.  Carlus Pavlov, MD PhD [facility] Endocrinology

## 2019-12-29 LAB — TSH: TSH: 0.19 u[IU]/mL — ABNORMAL LOW (ref 0.35–4.50)

## 2019-12-29 LAB — T3, FREE: T3, Free: 3.1 pg/mL (ref 2.3–4.2)

## 2019-12-29 LAB — T4, FREE: Free T4: 0.67 ng/dL (ref 0.60–1.60)

## 2020-01-04 DIAGNOSIS — G4733 Obstructive sleep apnea (adult) (pediatric): Secondary | ICD-10-CM | POA: Diagnosis not present

## 2020-01-26 DIAGNOSIS — G4733 Obstructive sleep apnea (adult) (pediatric): Secondary | ICD-10-CM | POA: Diagnosis not present

## 2020-01-31 ENCOUNTER — Other Ambulatory Visit: Payer: Self-pay | Admitting: Primary Care

## 2020-01-31 ENCOUNTER — Other Ambulatory Visit: Payer: Medicare PPO

## 2020-01-31 DIAGNOSIS — F4323 Adjustment disorder with mixed anxiety and depressed mood: Secondary | ICD-10-CM

## 2020-02-01 ENCOUNTER — Other Ambulatory Visit: Payer: Medicare PPO

## 2020-02-01 NOTE — Telephone Encounter (Signed)
Pharmacy requests refill on: Fluoxetine 40 mg   LAST REFILL: 11/01/2019 (Q-90, R-0) LAST OV: 11/01/2019 NEXT OV: 05/01/2020 PHARMACY: [id number] [place], [place]

## 2020-02-01 NOTE — Telephone Encounter (Signed)
Refills sent to pharmacy. 

## 2020-02-26 DIAGNOSIS — G4733 Obstructive sleep apnea (adult) (pediatric): Secondary | ICD-10-CM | POA: Diagnosis not present

## 2020-03-04 ENCOUNTER — Other Ambulatory Visit: Payer: Self-pay | Admitting: Primary Care

## 2020-03-04 DIAGNOSIS — I10 Essential (primary) hypertension: Secondary | ICD-10-CM

## 2020-03-04 DIAGNOSIS — E119 Type 2 diabetes mellitus without complications: Secondary | ICD-10-CM

## 2020-03-04 DIAGNOSIS — F4323 Adjustment disorder with mixed anxiety and depressed mood: Secondary | ICD-10-CM

## 2020-03-06 ENCOUNTER — Other Ambulatory Visit: Payer: Self-pay | Admitting: Primary Care

## 2020-03-06 DIAGNOSIS — E119 Type 2 diabetes mellitus without complications: Secondary | ICD-10-CM

## 2020-03-22 DIAGNOSIS — Z961 Presence of intraocular lens: Secondary | ICD-10-CM | POA: Diagnosis not present

## 2020-03-22 DIAGNOSIS — H43813 Vitreous degeneration, bilateral: Secondary | ICD-10-CM | POA: Diagnosis not present

## 2020-03-22 DIAGNOSIS — E119 Type 2 diabetes mellitus without complications: Secondary | ICD-10-CM | POA: Diagnosis not present

## 2020-03-22 LAB — HM DIABETES EYE EXAM

## 2020-03-25 DIAGNOSIS — G4733 Obstructive sleep apnea (adult) (pediatric): Secondary | ICD-10-CM | POA: Diagnosis not present

## 2020-03-28 ENCOUNTER — Encounter: Payer: Self-pay | Admitting: Primary Care

## 2020-04-07 ENCOUNTER — Ambulatory Visit: Payer: Medicare PPO | Admitting: Internal Medicine

## 2020-04-10 ENCOUNTER — Ambulatory Visit (INDEPENDENT_AMBULATORY_CARE_PROVIDER_SITE_OTHER): Payer: Medicare PPO | Admitting: Internal Medicine

## 2020-04-10 VITALS — BP 126/80 | HR 73 | Temp 98.6°F | Resp 16 | Ht 67.0 in | Wt 244.0 lb

## 2020-04-10 DIAGNOSIS — Z6838 Body mass index (BMI) 38.0-38.9, adult: Secondary | ICD-10-CM

## 2020-04-10 DIAGNOSIS — E669 Obesity, unspecified: Secondary | ICD-10-CM | POA: Diagnosis not present

## 2020-04-10 DIAGNOSIS — Z9989 Dependence on other enabling machines and devices: Secondary | ICD-10-CM | POA: Diagnosis not present

## 2020-04-10 DIAGNOSIS — Z7189 Other specified counseling: Secondary | ICD-10-CM

## 2020-04-10 DIAGNOSIS — G4733 Obstructive sleep apnea (adult) (pediatric): Secondary | ICD-10-CM | POA: Diagnosis not present

## 2020-04-10 NOTE — Progress Notes (Unsigned)
[facility] [address] [place], [place] [address]  Pulmonary Sleep Medicine   Office Visit Note  Patient Name: JOELI FENNER DOB: 29-Feb-1940 MRN [id number]    Chief Complaint: Obstructive Sleep Apnea visit  Brief History:  Montoya is seen today for follow up The patient has a 6 year history of sleep apnea.She has a new CPAP since the recal Patient is using PAP nightly.  The patient feels more rested after sleeping with PAP.  The patient reports benefiting from PAP use. Reported sleepiness is  improved and the Epworth Sleepiness Score is 2 out of 24. The patient does not take naps. The patient complains of the following: dry mouth  The compliance download showsexcellent  compliance with an average use time of 7.7 hours. The AHI is 0.6  The patient does not complain of limb movements disrupting sleep.  ROS  General: (-) fever, (-) chills, (-) night sweat Nose and Sinuses: (-) nasal stuffiness or itchiness, (-) postnasal drip, (-) nosebleeds, (-) sinus trouble. Mouth and Throat: (-) sore throat, (-) hoarseness. Neck: (-) swollen glands, (-) enlarged thyroid, (-) neck pain. Respiratory: - cough, - shortness of breath, - wheezing. Neurologic: - numbness, - tingling. Psychiatric: - anxiety, - depression   Current Medication: Outpatient Encounter Medications as of 04/10/2020  Medication Sig  . FLUoxetine (PROZAC) 40 MG capsule TAKE 1 CAPSULE(40 MG) BY MOUTH DAILY FOR ANXIETY/ DEPRESSION  . glipiZIDE (GLUCOTROL XL) 10 MG 24 hr tablet TAKE 1 TABLET(10 MG) BY MOUTH DAILY WITH BREAKFAST FOR DIABETES  . JANUVIA 100 MG tablet TAKE 1 TABLET(100 MG) BY MOUTH DAILY FOR DIABETES  . lisinopril (ZESTRIL) 10 MG tablet TAKE 1 TABLET BY MOUTH EVERY DAY FOR BLOOD PRESSURE  . metFORMIN (GLUCOPHAGE-XR) 500 MG 24 hr tablet Take 2 tablets (1,000 mg total) by mouth 2 (two) times daily with a meal. For diabetes.  . mometasone (NASONEX) 50 MCG/ACT nasal spray SHAKE LIQUID AND USE 2 SPRAYS  NASALLY DAILY  . Multiple Vitamins-Minerals (CENTRUM SILVER PO) Take 1 tablet by mouth daily.   No facility-administered encounter medications on file as of 04/10/2020.    Surgical History: Past Surgical History:  Procedure Laterality Date  . ABDOMINAL HYSTERECTOMY  1976   complete  . BREAST BIOPSY Right    neg  . BREAST SURGERY  1968   benign  . CATARACT EXTRACTION, BILATERAL  2016   [facility]   . NECK SURGERY  1993   ?bone spur removal  . URETHRA SURGERY    . WRIST FRACTURE SURGERY      Medical History: Past Medical History:  Diagnosis Date  . Anxiety and depression   . Diabetes mellitus   . HTN (hypertension)    120/80's at home  . Thyroid disorder     Family History: Non contributory to the present illness  Social History: Social History   Socioeconomic History  . Marital status: Married    Spouse name: Not on file  . Number of children: Not on file  . Years of education: Not on file  . Highest education level: Not on file  Occupational History  . Not on file  Tobacco Use  . Smoking status: Never Smoker  . Smokeless tobacco: Never Used  Vaping Use  . Vaping Use: Never used  Substance and Sexual Activity  . Alcohol use: No  . Drug use: No  . Sexual activity: Never  Other Topics Concern  . Not on file  Social History Narrative   Married.  2 children, 2 grand children.   Lives in [place].   Retired, once worked as a Runner, broadcasting/film/video.    Enjoys spending time with family, reading.    Social Determinants of Health   Financial Resource Strain: Not on file  Food Insecurity: Not on file  Transportation Needs: Not on file  Physical Activity: Not on file  Stress: Not on file  Social Connections: Not on file  Intimate Partner Violence: Not on file    Vital Signs: Blood pressure 126/80, pulse 73, temperature 98.6 F (37 C), temperature source Temporal, resp. rate 16, height 5\' 7"  (1.702 m), weight 244 lb (110.7 kg), SpO2 99  %.  Examination: General Appearance: The patient is well-developed, well-nourished, and in no distress. Neck Circumference: 41 Skin: Gross inspection of skin unremarkable. Head: normocephalic, no gross deformities. Eyes: no gross deformities noted. ENT: ears appear grossly normal Neurologic: Alert and oriented. No involuntary movements.    EPWORTH SLEEPINESS SCALE:  Scale:  (0)= no chance of dozing; (1)= slight chance of dozing; (2)= moderate chance of dozing; (3)= high chance of dozing  Chance  Situtation    Sitting and reading: 1    Watching TV: 1    Sitting Inactive in public: 0    As a passenger in car: 0      Lying down to rest: 0    Sitting and talking: 0    Sitting quielty after lunch: 0    In a car, stopped in traffic: 0   TOTAL SCORE:   2 out of 24    SLEEP STUDIES:  1. HST 09/2014 REI 15 SpO72min 81%    CPAP COMPLIANCE DATA:  Date Range: 12/09/19 - 04/06/20  Average Daily Use: 7:19 hours  Median Use: 7:37  Compliance for > 4 Hours: 95%  AHI: 0.6 respiratory events per hour  Days Used: 114/120   Mask Leak: 35.1  95th Percentile Pressure: 11.8 cmH2O  LABS: Recent Results (from the past 2160 hour(s))  HM DIABETES EYE EXAM     Status: None   Collection Time: 03/22/20 12:00 AM  Result Value Ref Range   HM Diabetic Eye Exam No Retinopathy No Retinopathy    Radiology: NM THYROID MULT UPTAKE W/IMAGING  Result Date: 09/16/2017 CLINICAL DATA:  Thyrotoxicosis without thyroid storm EXAM: THYROID SCAN AND UPTAKE - 4 AND 24 HOURS TECHNIQUE: Following oral administration of I-123 capsule, anterior planar imaging was acquired at 24 hours. Thyroid uptake was calculated with a thyroid probe at 4-6 hours and 24 hours. RADIOPHARMACEUTICALS:  290 uCi I-123 sodium iodide p.o. COMPARISON:  None FINDINGS: Small RIGHT thyroid lobe versus LEFT versus cold nodule at inferior pole RIGHT lobe. No additional areas of increased or decreased tracer localization. 6  hour I-123 uptake = 11.8% (normal 5-20%) 24 hour I-123 uptake = 35.0% (normal 10-30%) IMPRESSION: Mildly elevated 24 hour radio iodine uptake consistent with hyperthyroidism. Question asymmetric thyroid lobes smaller on RIGHT versus cold nodule at inferior pole RIGHT lobe; thyroid ultrasound recommended to exclude inferior pole RIGHT lobe mass. Electronically Signed   By: 09/18/2017 M.D.   On: 09/16/2017 11:26    No results found.  No results found.    Assessment and Plan: Patient Active Problem List   Diagnosis Date Noted  . OSA on CPAP 11/29/2019  . CPAP use counseling 11/29/2019  . 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 Drug-induced constipation 10/19/2019  . Herpes zoster without complication 10/11/2019  . Abdominal pain 10/05/2019  . Herpes simplex 09/16/2017  .  Preventative health care 09/16/2017  . Multinodular goiter 11/15/2016  . Adjustment disorder with mixed anxiety and depressed mood 02/01/2015  . Left shoulder pain 09/13/2014  . Chronic fatigue 09/13/2014  . Medicare annual wellness visit, subsequent 02/05/2013  . Thrombocytopenia (HCC) 09/30/2011  . Type 2 diabetes mellitus (HCC) 07/29/2011  . Thyrotoxicosis 07/29/2011  . Obesity (BMI 30-39.9) 06/21/2011  . Hypertension 12/18/2010   1. OSA on CPAP The patient does tolerate PAP and reports significant benefit from PAP use. The patient was reminded how to adjust the humidifier a. The patient was also counselled on the role of weight loss in treating sleep apnea.  The compliance has been excellent and the apnea is controlled. OSA- continue excellent compliance   2. CPAP use counseling CPAP Counseling: had a lengthy discussion with the patient regarding the importance of PAP therapy in management of the sleep apnea. Patient appears to understand the risk factor reduction and also understands the risks associated with untreated sleep apnea. Patient will try to make a good faith effort to remain compliant with  therapy. Also instructed the patient on proper cleaning of the device including the water must be changed daily if possible and use of distilled water is preferred. Patient understands that the machine should be regularly cleaned with appropriate recommended cleaning solutions that do not damage the PAP machine for example given white vinegar and water rinses. Other methods such as ozone treatment may not be as good as these simple methods to achieve cleaning.  3. Obesity (BMI 30-39.9) Obesity Counseling: Had a lengthy discussion regarding patients BMI and weight issues. Patient was instructed on portion control as well as increased activity. Also discussed caloric restrictions with trying to maintain intake less than 2000 Kcal. Discussions were made in accordance with the 5As of weight management. Simple actions such as not eating late and if able to, taking a walk is suggested.   General Counseling: I have discussed the findings of the evaluation and examination with Angelyne.  I have also discussed any further diagnostic evaluation thatmay be needed or ordered today. Raychelle verbalizes understanding of the findings of todays visit. We also reviewed her medications today and discussed drug interactions and side effects including but not limited excessive drowsiness and altered mental states. We also discussed that there is always a risk not just to her but also people around her. she has been encouraged to call the office with any questions or concerns that should arise related to todays visit.  No orders of the defined types were placed in this encounter.       I have personally obtained a history, examined the patient, evaluated laboratory and imaging results, formulated the assessment and plan and placed orders.   Valentino Hue Sol Blazing, PhD, FAASM  Diplomate, American Board of Sleep Medicine    Yevonne Pax, MD [facility] Diplomate ABMS Pulmonary and Critical Care Medicine Sleep medicine

## 2020-04-10 NOTE — Patient Instructions (Signed)

## 2020-04-18 DIAGNOSIS — G4733 Obstructive sleep apnea (adult) (pediatric): Secondary | ICD-10-CM | POA: Diagnosis not present

## 2020-04-25 DIAGNOSIS — G4733 Obstructive sleep apnea (adult) (pediatric): Secondary | ICD-10-CM | POA: Diagnosis not present

## 2020-05-01 ENCOUNTER — Ambulatory Visit: Payer: Medicare PPO | Admitting: Primary Care

## 2020-05-01 ENCOUNTER — Other Ambulatory Visit: Payer: Self-pay | Admitting: Primary Care

## 2020-05-01 DIAGNOSIS — F4323 Adjustment disorder with mixed anxiety and depressed mood: Secondary | ICD-10-CM

## 2020-05-09 ENCOUNTER — Other Ambulatory Visit: Payer: Self-pay

## 2020-05-09 ENCOUNTER — Ambulatory Visit: Payer: Medicare PPO | Admitting: Primary Care

## 2020-05-09 ENCOUNTER — Encounter: Payer: Self-pay | Admitting: Primary Care

## 2020-05-09 VITALS — BP 118/68 | HR 71 | Temp 97.9°F | Ht 67.0 in | Wt 248.2 lb

## 2020-05-09 DIAGNOSIS — E119 Type 2 diabetes mellitus without complications: Secondary | ICD-10-CM

## 2020-05-09 LAB — POCT GLYCOSYLATED HEMOGLOBIN (HGB A1C): Hemoglobin A1C: 7 % — AB (ref 4.0–5.6)

## 2020-05-09 NOTE — Patient Instructions (Signed)
Please schedule a physical to meet with me for October 2022.   It was a pleasure to see you today!

## 2020-05-09 NOTE — Assessment & Plan Note (Signed)
Well controlled in the office with A1C of 7.0!  Continue Januvia 100 mg daily, metformin XR 1000 mg BID, Glipizide XL 10 mg daily.  Foot exam today. Eye exam UTD. Pneumonia vaccine UTD. Managed on ACE-I.  Follow up in 6 months.

## 2020-05-09 NOTE — Progress Notes (Signed)
Subjective:    Patient ID: Courtney Stone, female    DOB: 10/27/1940, 80 y.o.   MRN: [id number]  HPI  Courtney Stone is a very pleasant 80 y.o. female with a history of type 2 diabetes, hypertension, OSA, thyrotoxicosis who presents today for follow up of diabetes.  Current medications include: Glipizide XL 10 mg, Januvia 100 mg, Metformin XR 1000 mg BID.  She is checking her blood glucose 1 times weekly and is getting readings ranging 130-170.  Last A1C: 7.6 in September 2021, 7.0 today Last Eye Exam: UTD Last Foot Exam: Due Pneumonia Vaccination: UTD ACE/ARB: Lisinopril  Statin: None.  BP Readings from Last 3 Encounters:  05/09/20 118/68  04/10/20 126/80  12/28/19 128/72      Review of Systems  Eyes: Negative for visual disturbance.  Cardiovascular: Negative for chest pain.  Neurological: Negative for dizziness and numbness.         Past Medical History:  Diagnosis Date  . Anxiety and depression   . Diabetes mellitus   . HTN (hypertension)    120/80's at home  . Thyroid disorder     Social History   Socioeconomic History  . Marital status: Married    Spouse name: Not on file  . Number of children: Not on file  . Years of education: Not on file  . Highest education level: Not on file  Occupational History  . Not on file  Tobacco Use  . Smoking status: Never Smoker  . Smokeless tobacco: Never Used  Vaping Use  . Vaping Use: Never used  Substance and Sexual Activity  . Alcohol use: No  . Drug use: No  . Sexual activity: Never  Other Topics Concern  . Not on file  Social History Narrative   Married.    2 children, 2 grand children.   Lives in [place].   Retired, once worked as a Runner, broadcasting/film/video.    Enjoys spending time with family, reading.    Social Determinants of Health   Financial Resource Strain: Not on file  Food Insecurity: Not on file  Transportation Needs: Not on file  Physical Activity: Not on file  Stress: Not on file  Social  Connections: Not on file  Intimate Partner Violence: Not on file    Past Surgical History:  Procedure Laterality Date  . ABDOMINAL HYSTERECTOMY  1976   complete  . BREAST BIOPSY Right    neg  . BREAST SURGERY  1968   benign  . CATARACT EXTRACTION, BILATERAL  2016   [facility]   . NECK SURGERY  1993   ?bone spur removal  . URETHRA SURGERY    . WRIST FRACTURE SURGERY      Family History  Problem Relation Age of Onset  . Cancer Mother        lung  . Cancer Father   . COPD Father   . Diabetes Sister   . Hypertension Sister   . Breast cancer Paternal Grandmother 9    Allergies  Allergen Reactions  . Adhesive [Tape]   . Sulfa Antibiotics     Headache  . Latex Rash    Current Outpatient Medications on File Prior to Visit  Medication Sig Dispense Refill  . FLUoxetine (PROZAC) 40 MG capsule TAKE 1 CAPSULE(40 MG) BY MOUTH DAILY FOR ANXIETY OR DEPRESSION 90 capsule 1  . glipiZIDE (GLUCOTROL XL) 10 MG 24 hr tablet TAKE 1 TABLET(10 MG) BY MOUTH DAILY WITH BREAKFAST FOR DIABETES 90 tablet 2  .  JANUVIA 100 MG tablet TAKE 1 TABLET(100 MG) BY MOUTH DAILY FOR DIABETES 90 tablet 1  . lisinopril (ZESTRIL) 10 MG tablet TAKE 1 TABLET BY MOUTH EVERY DAY FOR BLOOD PRESSURE 90 tablet 2  . metFORMIN (GLUCOPHAGE-XR) 500 MG 24 hr tablet Take 2 tablets (1,000 mg total) by mouth 2 (two) times daily with a meal. For diabetes. 360 tablet 1  . mometasone (NASONEX) 50 MCG/ACT nasal spray SHAKE LIQUID AND USE 2 SPRAYS NASALLY DAILY 17 g 5  . Multiple Vitamins-Minerals (CENTRUM SILVER PO) Take 1 tablet by mouth daily.    . Fluocinolone Acetonide 0.01 % OIL      No current facility-administered medications on file prior to visit.    BP 118/68   Pulse 71   Temp 97.9 F (36.6 C) (Temporal)   Ht 5\' 7"  (1.702 m)   Wt 248 lb 4 oz (112.6 kg)   SpO2 97%   BMI 38.88 kg/m  Objective:   Physical Exam Cardiovascular:     Rate and Rhythm: Normal rate and regular rhythm.  Pulmonary:      Effort: Pulmonary effort is normal.     Breath sounds: Normal breath sounds.  Musculoskeletal:     Cervical back: Neck supple.  Skin:    General: Skin is warm and dry.           Assessment & Plan:      This visit occurred during the SARS-CoV-2 public health emergency.  Safety protocols were in place, including screening questions prior to the visit, additional usage of staff PPE, and extensive cleaning of exam room while observing appropriate contact time as indicated for disinfecting solutions.

## 2020-05-23 ENCOUNTER — Other Ambulatory Visit: Payer: Self-pay | Admitting: Primary Care

## 2020-05-23 DIAGNOSIS — E119 Type 2 diabetes mellitus without complications: Secondary | ICD-10-CM

## 2020-05-25 DIAGNOSIS — G4733 Obstructive sleep apnea (adult) (pediatric): Secondary | ICD-10-CM | POA: Diagnosis not present

## 2020-06-08 DIAGNOSIS — H6063 Unspecified chronic otitis externa, bilateral: Secondary | ICD-10-CM | POA: Diagnosis not present

## 2020-06-08 DIAGNOSIS — J301 Allergic rhinitis due to pollen: Secondary | ICD-10-CM | POA: Diagnosis not present

## 2020-06-08 DIAGNOSIS — H6121 Impacted cerumen, right ear: Secondary | ICD-10-CM | POA: Diagnosis not present

## 2020-06-25 DIAGNOSIS — G4733 Obstructive sleep apnea (adult) (pediatric): Secondary | ICD-10-CM | POA: Diagnosis not present

## 2020-07-16 ENCOUNTER — Encounter: Payer: Self-pay | Admitting: Internal Medicine

## 2020-07-25 ENCOUNTER — Other Ambulatory Visit: Payer: Self-pay | Admitting: Internal Medicine

## 2020-07-25 DIAGNOSIS — E0590 Thyrotoxicosis, unspecified without thyrotoxic crisis or storm: Secondary | ICD-10-CM

## 2020-07-25 DIAGNOSIS — G4733 Obstructive sleep apnea (adult) (pediatric): Secondary | ICD-10-CM | POA: Diagnosis not present

## 2020-07-31 ENCOUNTER — Other Ambulatory Visit: Payer: Medicare PPO

## 2020-08-01 ENCOUNTER — Other Ambulatory Visit: Payer: Medicare PPO

## 2020-08-01 ENCOUNTER — Telehealth: Payer: Self-pay | Admitting: Internal Medicine

## 2020-08-01 NOTE — Telephone Encounter (Signed)
No, just the uptake in preparation for RAI treatment.  She had a thyroid uptake with imaging in 2019.

## 2020-08-01 NOTE — Telephone Encounter (Signed)
[facility] nuclear medicine called to find out if provider wanted imaging with the NM Thyroid Uptake. Please advise via referral or by calling their office

## 2020-08-01 NOTE — Telephone Encounter (Signed)
Please advise 

## 2020-08-02 NOTE — Telephone Encounter (Signed)
Clinic advised No, just the uptake in preparation for RAI treatment.  She had a thyroid uptake with imaging in 2019.

## 2020-08-08 ENCOUNTER — Encounter: Payer: Self-pay | Admitting: Internal Medicine

## 2020-08-09 ENCOUNTER — Encounter
Admission: RE | Admit: 2020-08-09 | Discharge: 2020-08-09 | Disposition: A | Discharge status: Home / Self Care | Payer: Medicare PPO | Source: Ambulatory Visit | Attending: Internal Medicine | Admitting: Internal Medicine

## 2020-08-09 ENCOUNTER — Other Ambulatory Visit: Payer: Self-pay

## 2020-08-09 DIAGNOSIS — E0590 Thyrotoxicosis, unspecified without thyrotoxic crisis or storm: Secondary | ICD-10-CM | POA: Diagnosis not present

## 2020-08-09 MED ORDER — SODIUM IODIDE I-123 7.4 MBQ CAPS
291.3300 | ORAL_CAPSULE | Freq: Once | ORAL | Status: AC
  Administered 2020-08-09: 291.33 via ORAL

## 2020-08-10 ENCOUNTER — Encounter
Admission: RE | Admit: 2020-08-10 | Discharge: 2020-08-10 | Disposition: A | Discharge status: Home / Self Care | Payer: Medicare PPO | Source: Ambulatory Visit | Attending: Internal Medicine | Admitting: Internal Medicine

## 2020-08-10 DIAGNOSIS — E0590 Thyrotoxicosis, unspecified without thyrotoxic crisis or storm: Secondary | ICD-10-CM | POA: Diagnosis not present

## 2020-08-12 ENCOUNTER — Other Ambulatory Visit: Payer: Self-pay | Admitting: Internal Medicine

## 2020-08-12 DIAGNOSIS — E042 Nontoxic multinodular goiter: Secondary | ICD-10-CM

## 2020-08-12 DIAGNOSIS — E0590 Thyrotoxicosis, unspecified without thyrotoxic crisis or storm: Secondary | ICD-10-CM

## 2020-08-22 ENCOUNTER — Other Ambulatory Visit: Payer: Self-pay | Admitting: Internal Medicine

## 2020-08-22 ENCOUNTER — Telehealth: Payer: Self-pay | Admitting: Internal Medicine

## 2020-08-22 DIAGNOSIS — G4733 Obstructive sleep apnea (adult) (pediatric): Secondary | ICD-10-CM | POA: Diagnosis not present

## 2020-08-22 DIAGNOSIS — E0590 Thyrotoxicosis, unspecified without thyrotoxic crisis or storm: Secondary | ICD-10-CM

## 2020-08-22 NOTE — Telephone Encounter (Signed)
Lab appt needed.

## 2020-08-22 NOTE — Telephone Encounter (Signed)
Patients needs an updated TSH before she can have iodine 131 Thyroid Therapy. Patient states that provider said she did not need a TSH. Lupita Leash needs to confirm that she does not need an updated TSH.

## 2020-08-22 NOTE — Telephone Encounter (Signed)
Please advise 

## 2020-08-25 DIAGNOSIS — G4733 Obstructive sleep apnea (adult) (pediatric): Secondary | ICD-10-CM | POA: Diagnosis not present

## 2020-09-01 ENCOUNTER — Encounter
Admission: RE | Admit: 2020-09-01 | Discharge: 2020-09-01 | Disposition: A | Discharge status: Home / Self Care | Payer: Medicare PPO | Source: Ambulatory Visit | Attending: Internal Medicine | Admitting: Internal Medicine

## 2020-09-01 ENCOUNTER — Other Ambulatory Visit: Payer: Self-pay

## 2020-09-01 DIAGNOSIS — E0590 Thyrotoxicosis, unspecified without thyrotoxic crisis or storm: Secondary | ICD-10-CM | POA: Insufficient documentation

## 2020-09-01 DIAGNOSIS — E042 Nontoxic multinodular goiter: Secondary | ICD-10-CM | POA: Insufficient documentation

## 2020-09-01 MED ORDER — SODIUM IODIDE I 131 CAPSULE
35.8000 | Freq: Once | INTRAVENOUS | Status: AC | PRN
  Administered 2020-09-01: 35.8 via ORAL

## 2020-09-04 ENCOUNTER — Other Ambulatory Visit: Payer: Self-pay | Admitting: Primary Care

## 2020-09-04 DIAGNOSIS — E119 Type 2 diabetes mellitus without complications: Secondary | ICD-10-CM

## 2020-09-04 NOTE — Telephone Encounter (Signed)
Patient has follow up 10/22 refill has been called in.

## 2020-09-06 ENCOUNTER — Other Ambulatory Visit: Payer: Self-pay | Admitting: Family Medicine

## 2020-09-06 DIAGNOSIS — E119 Type 2 diabetes mellitus without complications: Secondary | ICD-10-CM

## 2020-09-25 DIAGNOSIS — G4733 Obstructive sleep apnea (adult) (pediatric): Secondary | ICD-10-CM | POA: Diagnosis not present

## 2020-09-26 NOTE — Telephone Encounter (Signed)
See request from my patient. Let me know if you can or cannot accommodate, no pressure! Thanks!

## 2020-09-29 NOTE — Telephone Encounter (Signed)
Courtney Stone, I sent her a my chart message before our provider meeting. We decided to delay CPE's for those who cannot make it out to [facility].   Can we reschedule her for late December for [facility]? We should be back by then, hopefully.

## 2020-10-02 NOTE — Telephone Encounter (Signed)
Called per pt request flu appointment made for [facility] as well as CPE moved.

## 2020-10-09 ENCOUNTER — Encounter: Payer: Self-pay | Admitting: Internal Medicine

## 2020-10-09 ENCOUNTER — Other Ambulatory Visit: Payer: Self-pay

## 2020-10-09 ENCOUNTER — Ambulatory Visit: Payer: Medicare PPO | Admitting: Internal Medicine

## 2020-10-09 VITALS — BP 140/82 | HR 89 | Ht 67.0 in | Wt 250.0 lb

## 2020-10-09 DIAGNOSIS — E0590 Thyrotoxicosis, unspecified without thyrotoxic crisis or storm: Secondary | ICD-10-CM

## 2020-10-09 DIAGNOSIS — E042 Nontoxic multinodular goiter: Secondary | ICD-10-CM | POA: Diagnosis not present

## 2020-10-09 LAB — T3, FREE: T3, Free: 3.4 pg/mL (ref 2.3–4.2)

## 2020-10-09 LAB — T4, FREE: Free T4: 0.83 ng/dL (ref 0.60–1.60)

## 2020-10-09 LAB — TSH: TSH: 0.44 u[IU]/mL (ref 0.35–5.50)

## 2020-10-09 NOTE — Patient Instructions (Signed)
Please stop at the lab. Please return in 4 months.  

## 2020-10-09 NOTE — Progress Notes (Signed)
Patient ID: Courtney Stone, female   DOB: September 02, 1940, 80 y.o.   MRN: [id number]   This visit occurred during the SARS-CoV-2 public health emergency.  Safety protocols were in place, including screening questions prior to the visit, additional usage of staff PPE, and extensive cleaning of exam room while observing appropriate contact time as indicated for disinfecting solutions.   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  Last visit 9 months ago.  Interim history: Since last visit, she had RAI treatment for her hyperthyroidism on 09/01/2020. She continues to  have fatigue - chronic for her. She has heat intolerance - chronic. No constipation, hair loss.   Reviewed her TFTs: Lab Results  Component Value Date   TSH 0.19 (L) 12/28/2019   TSH 0.07 (L) 10/11/2019   TSH 0.11 (L) 10/15/2018   TSH 0.09 (L) 08/18/2017   TSH 0.36 02/20/2017   TSH 0.40 11/15/2016   TSH 0.08 (L) 09/12/2016   TSH 0.25 (L) 05/31/2015   TSH 0.32 (L) 09/13/2014   TSH 0.42 06/13/2014   FREET4 0.67 12/28/2019   FREET4 0.90 08/18/2017   FREET4 0.80 02/20/2017   FREET4 0.76 11/15/2016   FREET4 0.78 12/18/2011   FREET4 0.79 09/26/2011    Lab Results  Component Value Date   T3FREE 3.1 12/28/2019   T3FREE 3.5 08/18/2017   T3FREE 2.9 02/20/2017   T3FREE 3.3 11/15/2016   Thyroid U/S (01/2010): MNG. Followed for nodules for the last 40 years.   Thyroid uptake and scan (09/16/2017): Small RIGHT thyroid lobe versus LEFT versus cold nodule at inferior pole RIGHT lobe. No additional areas of increased or decreased tracer localization. 6 hour I-123 uptake = 11.8% (normal 5-20%) 24 hour I-123 uptake = 35.0% (normal 10-30%)  IMPRESSION: Mildly elevated 24 hour radio iodine uptake consistent with hyperthyroidism.  Question asymmetric thyroid lobes smaller on RIGHT versus cold nodule at inferior pole RIGHT lobe;  thyroid ultrasound recommended to exclude inferior pole RIGHT lobe mass.  Thyroid Uptake (08/12/2020): 4 hour I-123 uptake = 7.9% (normal 5-20%) 24 hour I-123 uptake = 16.9% (normal 10-30%)   IMPRESSION: Normal I 123 thyroid uptake.  RAI Tx (09/01/2020)  Pt denies: - feeling nodules in neck - hoarseness - dysphagia - choking - SOB with lying down She has acid reflux.  Pt does not have a FH of thyroid ds: mother. No FH of thyroid cancer. No h/o radiation tx to head or neck.  No recent contrast studies. No herbal supplements. No Biotin use. No recent steroids use.   She also has a history of type 2 diabetes, managed by PCP.  Reviewed HbA1c levels: Lab Results  Component Value Date   HGBA1C 7.0 (A) 05/09/2020   HGBA1C 7.6 (H) 10/11/2019   HGBA1C 7.9 (A) 07/15/2019   HGBA1C 8.3 (A) 04/14/2019   HGBA1C 8.8 (H) 10/15/2018   HGBA1C 8.1 (A) 03/19/2018   HGBA1C 7.4 (A) 08/18/2017   HGBA1C 7.9 (H) 03/18/2017   HGBA1C 7.9 (H) 12/17/2016   HGBA1C 7.9 (H) 09/12/2016   Pt is on a regimen of: - Metformin ER 1000 mg 2x a day, with meals - Glipizide XL 10 mg before breakfast - Januvia 100 mg before breakfast  ROS: Constitutional: no weight gain/no weight loss, + fatigue, no subjective hyperthermia, no subjective hypothermia Eyes: no blurry vision, no xerophthalmia ENT: no sore throat, + see HPI Cardiovascular: no CP/no SOB/no palpitations/+ leg swelling Respiratory: no cough/no  SOB/no wheezing Gastrointestinal: no N/no V/no D/no C/+ acid reflux Musculoskeletal: no muscle aches/no joint aches Skin: no rashes, no hair loss Neurological: no tremors/no numbness/no tingling/no dizziness  I reviewed pt's medications, allergies, PMH, social hx, family hx, and changes were documented in the history of present illness. Otherwise, unchanged from my initial visit note.  Past Medical History:  Diagnosis Date   Anxiety and depression    Diabetes mellitus    HTN (hypertension)     120/80's at home   Thyroid disorder    Past Surgical History:  Procedure Laterality Date   ABDOMINAL HYSTERECTOMY  1976   complete   BREAST BIOPSY Right    neg   BREAST SURGERY  1968   benign   CATARACT EXTRACTION, BILATERAL  2016   [facility]    NECK SURGERY  1993   ?bone spur removal   URETHRA SURGERY     WRIST FRACTURE SURGERY     Social History   Social History Main Topics   Smoking status: Never Smoker   Smokeless tobacco: Never Used   Alcohol use No   Drug use: No   Social History Narrative   Married.    2 children, 2 grand children.   Lives in [place].   Retired, once worked as a Runner, broadcasting/film/video.    Enjoys spending time with family, reading.    Current Outpatient Medications on File Prior to Visit  Medication Sig Dispense Refill   Fluocinolone Acetonide 0.01 % OIL      FLUoxetine (PROZAC) 40 MG capsule TAKE 1 CAPSULE(40 MG) BY MOUTH DAILY FOR ANXIETY OR DEPRESSION 90 capsule 1   glipiZIDE (GLUCOTROL XL) 10 MG 24 hr tablet TAKE 1 TABLET(10 MG) BY MOUTH DAILY WITH BREAKFAST FOR DIABETES 90 tablet 2   JANUVIA 100 MG tablet TAKE 1 TABLET(100 MG) BY MOUTH DAILY FOR DIABETES 90 tablet 0   lisinopril (ZESTRIL) 10 MG tablet TAKE 1 TABLET BY MOUTH EVERY DAY FOR BLOOD PRESSURE 90 tablet 2   metFORMIN (GLUCOPHAGE-XR) 500 MG 24 hr tablet TAKE 2 TABLETS(1000 MG) BY MOUTH TWICE DAILY WITH A MEAL FOR DIABETES 360 tablet 0   mometasone (NASONEX) 50 MCG/ACT nasal spray SHAKE LIQUID AND USE 2 SPRAYS NASALLY DAILY 17 g 5   Multiple Vitamins-Minerals (CENTRUM SILVER PO) Take 1 tablet by mouth daily.     No current facility-administered medications on file prior to visit.   Allergies  Allergen Reactions   Adhesive [Tape]    Sulfa Antibiotics     Headache   Latex Rash   Family History  Problem Relation Age of Onset   Cancer Mother        lung   Cancer Father    COPD Father    Diabetes Sister    Hypertension Sister    Breast cancer Paternal Grandmother [phone]     PE: There were no vitals taken for this visit. Wt Readings from Last 3 Encounters:  05/09/20 248 lb 4 oz (112.6 kg)  04/10/20 244 lb (110.7 kg)  12/28/19 240 lb 12.8 oz (109.2 kg)   Constitutional: overweight, in NAD Eyes: PERRLA, EOMI, no exophthalmos ENT: moist mucous membranes, no thyromegaly, no cervical lymphadenopathy Cardiovascular: RRR, No MRG, + B LE edema Respiratory: CTA B Gastrointestinal: abdomen soft, NT, ND, BS+ Musculoskeletal: no deformities, strength intact in all 4 Skin: moist, warm, no rashes Neurological: no tremor with outstretched hands, DTR normal in all 4  ASSESSMENT: 1. Thyrotoxicosis  2. MNG  PLAN:  1. Patient with no  history of low TSH at least since 2017, with a few thyrotoxic symptoms including heat intolerance, hyper defecation, anxiety, but without tremors, palpitations, weight loss.  She has a history of a multinodular goiter on ultrasound.  She had negative Graves' antibodies.  She did have a thyroid uptake and scan in 08/2017 that showed a slightly elevated 24-hour I-123 uptake and a small cold nodule. -At last visit, she returned after long absence of more than 2 years, referred back to endocrinology as her TSH was reportedly very low.  She had no hyperthyroid symptoms. -At that time, we discussed about proceeding with RAI treatment and she had this 09/01/2020. -She feels well after the treatment, without clear hypothyroid symptoms. -We discussed about the possibility of developing hypothyroidism but I did advise her that the small proportion of patients can maintain euthyroidism or can remain hyperthyroid. -We also discussed that if she develops hypothyroidism, she will need to be on levothyroxine.  Discussed about how to take this correctly. -We will check her TFTs today -I will have her return in 4 months  2. MNG  -Patient with long history of thyroid nodules, for 40 years, followed closely by ENT, with stability of the nodules over  time -She denies neck compression symptoms - one of her nodules remains palpable in the anterior neck but this is stable  -We will continue to follow her clinically for the nodules  Patient Instructions  Please stop at the lab.  Please come back for a follow-up appointment in 4 months  Office Visit on 10/09/2020  Component Date Value Ref Range Status   T3, Free 10/09/2020 3.4  2.3 - 4.2 pg/mL Final   Free T4 10/09/2020 0.83  0.60 - 1.60 ng/dL Final   Comment: Specimens from patients who are undergoing biotin therapy and /or ingesting biotin supplements may contain high levels of biotin.  The higher biotin concentration in these specimens interferes with this Free T4 assay.  Specimens that contain high levels  of biotin may cause false high results for this Free T4 assay.  Please interpret results in light of the total clinical presentation of the patient.     TSH 10/09/2020 0.44  0.35 - 5.50 uIU/mL Final   Normal TFTs.  Carlus Pavlov, MD PhD [facility] Endocrinology

## 2020-10-13 ENCOUNTER — Other Ambulatory Visit: Payer: Self-pay

## 2020-10-13 ENCOUNTER — Ambulatory Visit (INDEPENDENT_AMBULATORY_CARE_PROVIDER_SITE_OTHER): Payer: Medicare PPO

## 2020-10-13 ENCOUNTER — Ambulatory Visit: Payer: Medicare PPO

## 2020-10-13 DIAGNOSIS — Z23 Encounter for immunization: Secondary | ICD-10-CM

## 2020-10-25 DIAGNOSIS — G4733 Obstructive sleep apnea (adult) (pediatric): Secondary | ICD-10-CM | POA: Diagnosis not present

## 2020-11-08 ENCOUNTER — Encounter: Payer: Medicare PPO | Admitting: Primary Care

## 2020-11-29 ENCOUNTER — Other Ambulatory Visit: Payer: Self-pay | Admitting: Primary Care

## 2020-11-29 DIAGNOSIS — I10 Essential (primary) hypertension: Secondary | ICD-10-CM

## 2020-11-29 DIAGNOSIS — E119 Type 2 diabetes mellitus without complications: Secondary | ICD-10-CM

## 2020-12-05 DIAGNOSIS — F4323 Adjustment disorder with mixed anxiety and depressed mood: Secondary | ICD-10-CM

## 2020-12-07 DIAGNOSIS — J301 Allergic rhinitis due to pollen: Secondary | ICD-10-CM | POA: Diagnosis not present

## 2020-12-07 DIAGNOSIS — H6063 Unspecified chronic otitis externa, bilateral: Secondary | ICD-10-CM | POA: Diagnosis not present

## 2020-12-07 DIAGNOSIS — H6121 Impacted cerumen, right ear: Secondary | ICD-10-CM | POA: Diagnosis not present

## 2020-12-07 MED ORDER — FLUOXETINE HCL 20 MG PO CAPS: 20.0000 mg | ORAL_CAPSULE | Freq: Every day | ORAL | 0 refills | Status: DC

## 2020-12-11 ENCOUNTER — Other Ambulatory Visit: Payer: Self-pay | Admitting: Primary Care

## 2020-12-11 DIAGNOSIS — E119 Type 2 diabetes mellitus without complications: Secondary | ICD-10-CM

## 2021-02-01 ENCOUNTER — Encounter: Payer: Medicare PPO | Admitting: Primary Care

## 2021-02-08 ENCOUNTER — Ambulatory Visit: Payer: Medicare PPO | Admitting: Internal Medicine

## 2021-02-08 ENCOUNTER — Other Ambulatory Visit: Payer: Self-pay

## 2021-02-08 ENCOUNTER — Encounter: Payer: Self-pay | Admitting: Internal Medicine

## 2021-02-08 VITALS — BP 146/90 | HR 76 | Ht 67.0 in | Wt 250.0 lb

## 2021-02-08 DIAGNOSIS — E0590 Thyrotoxicosis, unspecified without thyrotoxic crisis or storm: Secondary | ICD-10-CM

## 2021-02-08 DIAGNOSIS — E042 Nontoxic multinodular goiter: Secondary | ICD-10-CM

## 2021-02-08 LAB — T4, FREE: Free T4: 0.79 ng/dL (ref 0.60–1.60)

## 2021-02-08 LAB — T3, FREE: T3, Free: 3.4 pg/mL (ref 2.3–4.2)

## 2021-02-08 LAB — TSH: TSH: 4.22 u[IU]/mL (ref 0.35–5.50)

## 2021-02-08 NOTE — Patient Instructions (Addendum)
Please stop at the lab.  If we need to start Levothyroxine, take the thyroid hormone every day, with water, at least 30 minutes before breakfast, separated by at least 4 hours from: - acid reflux medications - calcium - iron - multivitamins  Please return in 4-5 months, but likely sooner for labs.

## 2021-02-08 NOTE — Progress Notes (Signed)
Patient ID: Courtney Stone, female   DOB: 09-Jan-1941, 81 y.o.   MRN: [id number]   This visit occurred during the SARS-CoV-2 public health emergency.  Safety protocols were in place, including screening questions prior to the visit, additional usage of staff PPE, and extensive cleaning of exam room while observing appropriate contact time as indicated for disinfecting solutions.   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  Last visit 4 months ago.  Interim history: No new sxs since last OV. No complaints at this visit other than knee pain.  She also continues to have acid reflux.  Reviewed her TFTs: Lab Results  Component Value Date   TSH 0.44 10/09/2020   TSH 0.19 (L) 12/28/2019   TSH 0.07 (L) 10/11/2019   TSH 0.11 (L) 10/15/2018   TSH 0.09 (L) 08/18/2017   TSH 0.36 02/20/2017   TSH 0.40 11/15/2016   TSH 0.08 (L) 09/12/2016   TSH 0.25 (L) 05/31/2015   TSH 0.32 (L) 09/13/2014   FREET4 0.83 10/09/2020   FREET4 0.67 12/28/2019   FREET4 0.90 08/18/2017   FREET4 0.80 02/20/2017   FREET4 0.76 11/15/2016   FREET4 0.78 12/18/2011   FREET4 0.79 09/26/2011    Lab Results  Component Value Date   T3FREE 3.4 10/09/2020   T3FREE 3.1 12/28/2019   T3FREE 3.5 08/18/2017   T3FREE 2.9 02/20/2017   T3FREE 3.3 11/15/2016   Thyroid U/S (01/2010): MNG. Followed for nodules for the last 40 years.   Thyroid uptake and scan (09/16/2017): Small RIGHT thyroid lobe versus LEFT versus cold nodule at inferior pole RIGHT lobe. No additional areas of increased or decreased tracer localization. 6 hour I-123 uptake = 11.8% (normal 5-20%) 24 hour I-123 uptake = 35.0% (normal 10-30%)  IMPRESSION: Mildly elevated 24 hour radio iodine uptake consistent with hyperthyroidism.  Question asymmetric thyroid lobes smaller on RIGHT versus cold nodule at inferior pole RIGHT lobe; thyroid ultrasound  recommended to exclude inferior pole RIGHT lobe mass.  Thyroid Uptake (08/12/2020): 4 hour I-123 uptake = 7.9% (normal 5-20%) 24 hour I-123 uptake = 16.9% (normal 10-30%)   IMPRESSION: Normal I 123 thyroid uptake.  RAI Tx (09/01/2020)  Pt denies: - feeling nodules in neck - hoarseness - dysphagia - choking - SOB with lying down   Pt does not have a FH of thyroid ds: mother. No FH of thyroid cancer. No h/o radiation tx to head or neck. No Biotin use. No recent steroids use.   She also has a history of type 2 diabetes, managed by PCP.  Reviewed HbA1c levels: Lab Results  Component Value Date   HGBA1C 7.0 (A) 05/09/2020   HGBA1C 7.6 (H) 10/11/2019   HGBA1C 7.9 (A) 07/15/2019   HGBA1C 8.3 (A) 04/14/2019   HGBA1C 8.8 (H) 10/15/2018   HGBA1C 8.1 (A) 03/19/2018   HGBA1C 7.4 (A) 08/18/2017   HGBA1C 7.9 (H) 03/18/2017   HGBA1C 7.9 (H) 12/17/2016   HGBA1C 7.9 (H) 09/12/2016   Pt is on a regimen of: - Metformin ER 1000 mg 2x a day, with meals - Glipizide XL 10 mg before breakfast - Januvia 100 mg before breakfast  ROS:  + see HPI Cardiovascular: no CP/no SOB/no palpitations/+ leg swelling Gastrointestinal: no N/no V/no D/no C/+ acid reflux  I reviewed pt's medications, allergies, PMH, social hx, family hx, and changes were documented in the history of present illness. Otherwise, unchanged from my initial visit note.  Past Medical History:  Diagnosis Date   Anxiety and depression    Diabetes mellitus    HTN (hypertension)    120/80's at home   Thyroid disorder    Past Surgical History:  Procedure Laterality Date   ABDOMINAL HYSTERECTOMY  1976   complete   BREAST BIOPSY Right    neg   BREAST SURGERY  1968   benign   CATARACT EXTRACTION, BILATERAL  2016   [facility]    NECK SURGERY  1993   ?bone spur removal   URETHRA SURGERY     WRIST FRACTURE SURGERY     Social History   Social History Main Topics   Smoking status: Never Smoker   Smokeless  tobacco: Never Used   Alcohol use No   Drug use: No   Social History Narrative   Married.    2 children, 2 grand children.   Lives in [place].   Retired, once worked as a Runner, broadcasting/film/videoteacher.    Enjoys spending time with family, reading.    Current Outpatient Medications on File Prior to Visit  Medication Sig Dispense Refill   Fluocinolone Acetonide 0.01 % OIL      FLUoxetine (PROZAC) 20 MG capsule Take 1 capsule (20 mg total) by mouth daily. For anxiety and depression 90 capsule 0   glipiZIDE (GLUCOTROL XL) 10 MG 24 hr tablet Take 1 tablet (10 mg total) by mouth daily with breakfast. For diabetes. Office visit required for further refills. 90 tablet 0   JANUVIA 100 MG tablet TAKE 1 TABLET(100 MG) BY MOUTH DAILY FOR DIABETES 90 tablet 0   lisinopril (ZESTRIL) 10 MG tablet TAKE 1 TABLET BY MOUTH EVERY DAY FOR BLOOD PRESSURE. Office visit required for further refills. 90 tablet 0   metFORMIN (GLUCOPHAGE-XR) 500 MG 24 hr tablet Take 2 tablets (1,000 mg total) by mouth 2 (two) times daily with a meal. For diabetes. Office visit required for further refills. 360 tablet 0   mometasone (NASONEX) 50 MCG/ACT nasal spray SHAKE LIQUID AND USE 2 SPRAYS NASALLY DAILY 17 g 5   Multiple Vitamins-Minerals (CENTRUM SILVER PO) Take 1 tablet by mouth daily.     No current facility-administered medications on file prior to visit.   Allergies  Allergen Reactions   Adhesive [Tape]    Sulfa Antibiotics     Headache   Latex Rash   Family History  Problem Relation Age of Onset   Cancer Mother        lung   Cancer Father    COPD Father    Diabetes Sister    Hypertension Sister    Breast cancer Paternal Grandmother [phone]   PE: BP (!) 146/90 (BP Location: Left Arm, Patient Position: Sitting, Cuff Size: Normal)    Pulse 76    Ht 5\' 7"  (1.702 m)    Wt 250 lb (113.4 kg)    SpO2 98%    BMI 39.16 kg/m  Wt Readings from Last 3 Encounters:  02/08/21 250 lb (113.4 kg)  10/09/20 250 lb (113.4 kg)  05/09/20 248 lb 4 oz  (112.6 kg)   Constitutional: overweight, in NAD Eyes: PERRLA, EOMI, no exophthalmos ENT: moist mucous membranes, no thyromegaly, no cervical lymphadenopathy Cardiovascular: RRR, No MRG, + B LE edema Respiratory: CTA B Musculoskeletal: no deformities, strength intact in all 4 Skin: moist, warm, no rashes Neurological: no tremor with outstretched hands, DTR normal in all 4  ASSESSMENT: 1. Thyrotoxicosis  2. MNG  PLAN:  1. Patient with history of a  low TSH at least since 2017, with a few thyrotoxic symptoms including heat intolerance, hyper defecation, anxiety, but without tremors, palpitations, weight loss.  She had a history of a multinodular goiter on ultrasound.  She had negative Graves' antibodies.  She did have a thyroid uptake and scan in 08/2017 that showed a slightly elevated 24-hour uptake and a small cold nodule. -At last visit, she returned after long absence of more than 2 years, after being referred back to endocrinology as her TSH was reportedly very low.  She had no hyperthyroid symptoms. -We proceeded with RAI treatment on 09/01/2020 -We checked her TFTs in 09/2020 and they were normal, but we have no TFTs obtained afterwards -She feels well after the treatment, without clear hypo or hyperthyroid symptoms.   -At this visit, we again discussed that a small proportion of patients may remain hyperthyroid after the treatment. The majority of patients, however, developed hypothyroidism.if this is the case, she would need to be on levothyroxine.  I advised her that if we need to start, to take the thyroid hormone every day, with water, at least 30 minutes before breakfast, separated by at least 4 hours from: - acid reflux medications - calcium - iron - multivitamins -We will recheck her TFTs today and see if she needs levothyroxine afterwards -I will have her return in 4-5 months  2. MNG  -Patient with long history of thyroid nodules, for more than 40 years, followed closely by  ENT, with stability of nodules over time -No neck compression symptoms -One of the thyroid nodules remains palpable in the anterior neck, but this is stable -We will continue to follow her clinically for the nodules  Component     Latest Ref Rng & Units 02/08/2021  TSH     0.35 - 5.50 uIU/mL 4.22  T4,Free(Direct)     0.60 - 1.60 ng/dL 5.05  Triiodothyronine,Free,Serum     2.3 - 4.2 pg/mL 3.4  TFTs remain normal.  No need for levothyroxine supplementation. We will repeat the labs when she comes back in 4 months.  Carlus Pavlov, MD PhD [facility] Endocrinology

## 2021-02-25 ENCOUNTER — Other Ambulatory Visit: Payer: Self-pay | Admitting: Primary Care

## 2021-02-25 DIAGNOSIS — E119 Type 2 diabetes mellitus without complications: Secondary | ICD-10-CM

## 2021-02-25 NOTE — Telephone Encounter (Signed)
Patient is overdue for follow up, looks like she cancelled CPE from January 2023. Will need to be seen for further refills.  Please reschedule.

## 2021-02-26 MED ORDER — SITAGLIPTIN PHOSPHATE 100 MG PO TABS: 100.0000 mg | ORAL_TABLET | Freq: Every day | ORAL | 0 refills | Status: DC

## 2021-02-26 NOTE — Telephone Encounter (Signed)
Patient called back have made appointment for 3/1 for CPE will need refills in 2 weeks.

## 2021-02-26 NOTE — Telephone Encounter (Signed)
Noted. Refill(s) sent to pharmacy.  

## 2021-02-26 NOTE — Telephone Encounter (Signed)
Called pt to relate the message. LVMTCB to schedule CPE.

## 2021-02-26 NOTE — Addendum Note (Signed)
Addended by: Doreene Nest on: 02/26/2021 01:42 PM   Modules accepted: Orders

## 2021-02-27 ENCOUNTER — Other Ambulatory Visit: Payer: Self-pay | Admitting: Primary Care

## 2021-02-27 DIAGNOSIS — E119 Type 2 diabetes mellitus without complications: Secondary | ICD-10-CM

## 2021-02-27 DIAGNOSIS — I10 Essential (primary) hypertension: Secondary | ICD-10-CM

## 2021-03-02 DIAGNOSIS — F4323 Adjustment disorder with mixed anxiety and depressed mood: Secondary | ICD-10-CM

## 2021-03-07 MED ORDER — FLUOXETINE HCL 10 MG PO CAPS: 10.0000 mg | ORAL_CAPSULE | Freq: Every day | ORAL | 0 refills | Status: DC

## 2021-03-14 ENCOUNTER — Other Ambulatory Visit: Payer: Self-pay | Admitting: Primary Care

## 2021-03-14 DIAGNOSIS — F4323 Adjustment disorder with mixed anxiety and depressed mood: Secondary | ICD-10-CM

## 2021-03-21 ENCOUNTER — Ambulatory Visit: Payer: Medicare PPO

## 2021-03-21 ENCOUNTER — Encounter: Payer: Medicare PPO | Admitting: Primary Care

## 2021-03-26 DIAGNOSIS — Z961 Presence of intraocular lens: Secondary | ICD-10-CM | POA: Diagnosis not present

## 2021-03-26 DIAGNOSIS — H43813 Vitreous degeneration, bilateral: Secondary | ICD-10-CM | POA: Diagnosis not present

## 2021-03-26 DIAGNOSIS — E119 Type 2 diabetes mellitus without complications: Secondary | ICD-10-CM | POA: Diagnosis not present

## 2021-03-26 LAB — HM DIABETES EYE EXAM

## 2021-03-29 ENCOUNTER — Other Ambulatory Visit: Payer: Self-pay | Admitting: Primary Care

## 2021-03-29 DIAGNOSIS — E119 Type 2 diabetes mellitus without complications: Secondary | ICD-10-CM

## 2021-03-29 DIAGNOSIS — I10 Essential (primary) hypertension: Secondary | ICD-10-CM

## 2021-04-02 ENCOUNTER — Encounter: Payer: Self-pay | Admitting: Primary Care

## 2021-04-03 ENCOUNTER — Other Ambulatory Visit: Payer: Self-pay | Admitting: Primary Care

## 2021-04-03 DIAGNOSIS — F4323 Adjustment disorder with mixed anxiety and depressed mood: Secondary | ICD-10-CM

## 2021-04-07 ENCOUNTER — Other Ambulatory Visit: Payer: Self-pay | Admitting: Primary Care

## 2021-04-07 DIAGNOSIS — E119 Type 2 diabetes mellitus without complications: Secondary | ICD-10-CM

## 2021-04-09 ENCOUNTER — Ambulatory Visit (INDEPENDENT_AMBULATORY_CARE_PROVIDER_SITE_OTHER): Payer: Medicare PPO | Admitting: Internal Medicine

## 2021-04-09 VITALS — BP 139/90 | HR 70 | Resp 14 | Ht 67.0 in | Wt 247.0 lb

## 2021-04-09 DIAGNOSIS — I10 Essential (primary) hypertension: Secondary | ICD-10-CM | POA: Diagnosis not present

## 2021-04-09 DIAGNOSIS — Z7189 Other specified counseling: Secondary | ICD-10-CM

## 2021-04-09 DIAGNOSIS — G4733 Obstructive sleep apnea (adult) (pediatric): Secondary | ICD-10-CM

## 2021-04-09 DIAGNOSIS — E669 Obesity, unspecified: Secondary | ICD-10-CM | POA: Diagnosis not present

## 2021-04-09 DIAGNOSIS — Z9989 Dependence on other enabling machines and devices: Secondary | ICD-10-CM

## 2021-04-09 NOTE — Patient Instructions (Signed)

## 2021-04-09 NOTE — Progress Notes (Signed)
[facility] ?[address] ?[place], [place] [address] ? ?Pulmonary Sleep Medicine  ? ?Office Visit Note ? ?Patient Name: Courtney Stone ?DOB: 1940-07-05 ?MRN [id number] ? ? ? ?Chief Complaint: Obstructive Sleep Apnea visit ? ?Brief History: ? ?Courtney Stone is seen today for follow up. The patient has a 6.3 year history of sleep apnea. Patient is using PAP nightly @ APAP 5-12cmH2O, medium Eson nasal mask without chinstrap.  The patient feels great after sleeping with PAP.  The patient reports benefiting from PAP use. Reported sleepiness is  resolved and the Epworth Sleepiness Score is 2 out of 24. The patient does not take naps. The patient complains of the following: ongoing dry mouth with current nasal mask  The compliance download shows  compliance with an average use time of 7:41 hours@ 98%. The AHI is 0.8  The patient does not complain of limb movements disrupting sleep. ? ?ROS ? ?General: (-) fever, (-) chills, (-) night sweat ?Nose and Sinuses: (-) nasal stuffiness or itchiness, (-) postnasal drip, (-) nosebleeds, (-) sinus trouble. ?Mouth and Throat: (-) sore throat, (-) hoarseness. ?Neck: (-) swollen glands, (-) enlarged thyroid, (-) neck pain. ?Respiratory: - cough, - shortness of breath, -- wheezing. ?Neurologic: - numbness, - tingling. ?Psychiatric: - anxiety, - depression ? ? ?Current Medication: ?Outpatient Encounter Medications as of 04/09/2021  ?Medication Sig  ? Fluocinolone Acetonide 0.01 % OIL   ? FLUoxetine (PROZAC) 10 MG capsule TAKE 1 CAPSULE(10 MG) BY MOUTH DAILY FOR ANXIETY OR DEPRESSION  ? glipiZIDE (GLUCOTROL XL) 10 MG 24 hr tablet Take 1 tablet (10 mg total) by mouth daily with breakfast. For diabetes. Office visit required for further refills.  ? lisinopril (ZESTRIL) 10 MG tablet Take 1 tablet (10 mg total) by mouth daily. For blood pressure. Office visit required for further refills.  ? metFORMIN (GLUCOPHAGE-XR) 500 MG 24 hr tablet Take 2 tablets (1,000 mg total) by mouth 2 (two)  times daily with a meal. For diabetes. Office visit required for further refills.  ? mometasone (NASONEX) 50 MCG/ACT nasal spray SHAKE LIQUID AND USE 2 SPRAYS NASALLY DAILY  ? sitaGLIPtin (JANUVIA) 100 MG tablet Take 1 tablet (100 mg total) by mouth daily. for diabetes.Office visit required for further refills.  ? ?No facility-administered encounter medications on file as of 04/09/2021.  ? ? ?Surgical History: ?Past Surgical History:  ?Procedure Laterality Date  ? ABDOMINAL HYSTERECTOMY  1976  ? complete  ? BREAST BIOPSY Right   ? neg  ? BREAST SURGERY  1968  ? benign  ? CATARACT EXTRACTION, BILATERAL  2016  ? [facility]   ? NECK SURGERY  1993  ? ?bone spur removal  ? URETHRA SURGERY    ? WRIST FRACTURE SURGERY    ? ? ?Medical History: ?Past Medical History:  ?Diagnosis Date  ? Anxiety and depression   ? Diabetes mellitus   ? HTN (hypertension)   ? 120/80's at home  ? Thyroid disorder   ? ? ?Family History: ?Non contributory to the present illness ? ?Social History: ?Social History  ? ?Socioeconomic History  ? Marital status: Married  ?  Spouse name: Not on file  ? Number of children: Not on file  ? Years of education: Not on file  ? Highest education level: Not on file  ?Occupational History  ? Not on file  ?Tobacco Use  ? Smoking status: Never  ? Smokeless tobacco: Never  ?Vaping Use  ? Vaping Use: Never used  ?Substance and Sexual Activity  ?  Alcohol use: No  ? Drug use: No  ? Sexual activity: Never  ?Other Topics Concern  ? Not on file  ?Social History Narrative  ? Married.   ? 2 children, 2 grand children.  ? Lives in [place].  ? Retired, once worked as a Pharmacist, hospital.   ? Enjoys spending time with family, reading.   ? ?Social Determinants of Health  ? ?Financial Resource Strain: Not on file  ?Food Insecurity: Not on file  ?Transportation Needs: Not on file  ?Physical Activity: Not on file  ?Stress: Not on file  ?Social Connections: Not on file  ?Intimate Partner Violence: Not on file  ? ? ?Vital  Signs: ?Blood pressure 139/90, pulse 70, resp. rate 14, height 5\' 7"  (1.702 m), weight 247 lb (112 kg), SpO2 97 %. ?Body mass index is 38.69 kg/m?.  ? ? ?Examination: ?General Appearance: The patient is well-developed, well-nourished, and in no distress. ?Neck Circumference: 40 cm ?Skin: Gross inspection of skin unremarkable. ?Head: normocephalic, no gross deformities. ?Eyes: no gross deformities noted. ?ENT: ears appear grossly normal ?Neurologic: Alert and oriented. No involuntary movements. ? ? ? ?EPWORTH SLEEPINESS SCALE: ? ?Scale:  ?(0)= no chance of dozing; (1)= slight chance of dozing; (2)= moderate chance of dozing; (3)= high chance of dozing ? ?Chance  Situtation ?   ?Sitting and reading: 1 ?  ? Watching TV: 1 ?   ?Sitting Inactive in public: 0 ?   ?As a passenger in car: 0   ?   ?Lying down to rest: 0 ?   ?Sitting and talking: 0 ?   ?Sitting quielty after lunch: 0 ?   ?In a car, stopped in traffic: 0 ? ? ?TOTAL SCORE:   2 out of 24 ? ? ? ?SLEEP STUDIES: ? ?HST 09/2014 -  REI 15 SpO57min 81% ? ? ?CPAP COMPLIANCE DATA: ? ?Date Range: 04/03/20 - 04/02/21 ? ?Average Daily Use: 7:41 hours ? ?Median Use: 7:56 hours ? ?Compliance for > 4 Hours: 98% days ? ?AHI: 0.8 respiratory events per hour ? ?Days Used: 358/365 ? ?Mask Leak: 30.9 lpm ? ?95th Percentile Pressure: 11.9 cmH2O ? ? ? ? ? ? ? ? ?LABS: ?Recent Results (from the past 2160 hour(s))  ?TSH     Status: None  ? Collection Time: 02/08/21  2:17 PM  ?Result Value Ref Range  ? TSH 4.22 0.35 - 5.50 uIU/mL  ?T4, free     Status: None  ? Collection Time: 02/08/21  2:17 PM  ?Result Value Ref Range  ? Free T4 0.79 0.60 - 1.60 ng/dL  ?  Comment: Specimens from patients who are undergoing biotin therapy and /or ingesting biotin supplements may contain high levels of biotin.  The higher biotin concentration in these specimens interferes with this Free T4 assay.  Specimens that contain high levels  ?of biotin may cause false high results for this Free T4 assay.  Please  interpret results in light of the total clinical presentation of the patient.  ?  ?T3, free     Status: None  ? Collection Time: 02/08/21  2:17 PM  ?Result Value Ref Range  ? T3, Free 3.4 2.3 - 4.2 pg/mL  ?HM DIABETES EYE EXAM     Status: None  ? Collection Time: 03/26/21 12:00 AM  ?Result Value Ref Range  ? HM Diabetic Eye Exam No Retinopathy No Retinopathy  ? ? ?Radiology: ?NM RAI Therapy For Hyperthyroidism ? ?Result Date: 09/01/2020 ?CLINICAL DATA:  Hyperthyroidism EXAM: RADIOACTIVE IODINE THERAPY FOR HYPERTHYROIDISM  COMPARISON:  Thyroid uptake and scan 08/10/2020 TECHNIQUE: Radioactive iodine prescribed by Dr. Clovis Riley. The risks and benefits of radioactive iodine therapy were discussed with the patient in detail by Dr. Quintella Baton. Alternative therapies were also mentioned. Radiation safety was discussed with the patient, including how to protect the general public from exposure. There were no barriers to communication. Written consent was obtained. The patient then received a capsule containing the radiopharmaceutical. The patient will follow-up with the referring physician. RADIOPHARMACEUTICALS:  35.8 mCi I-131 sodium iodide orally IMPRESSION: Per oral administration of I-131 sodium iodide for the treatment of hyperthyroidism. Electronically Signed   By: Kerby Moors M.D.   On: 09/01/2020 11:38  ? ? ?No results found. ? ?No results found. ? ? ? ?Assessment and Plan: ?Patient Active Problem List  ? Diagnosis Date Noted  ? OSA on CPAP 11/29/2019  ? CPAP use counseling 11/29/2019  ? 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 Drug-induced constipation 10/19/2019  ? Herpes zoster without complication [id number]  ? Abdominal pain 10/05/2019  ? Herpes simplex 09/16/2017  ? Preventative health care 09/16/2017  ? Multinodular goiter 11/15/2016  ? Adjustment disorder with mixed anxiety and depressed mood 02/01/2015  ? Left shoulder pain 09/13/2014  ? Chronic fatigue 09/13/2014  ? Medicare annual wellness visit, subsequent  02/05/2013  ? Thrombocytopenia ([place]) 09/30/2011  ? Type 2 diabetes mellitus ([place]) 07/29/2011  ? 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 Obesity (BMI 30-39.9) 06/21/2011  ? Hypertension 12/18/2010  ? ? ?1. OSA on CPAP ?The

## 2021-04-16 ENCOUNTER — Ambulatory Visit (INDEPENDENT_AMBULATORY_CARE_PROVIDER_SITE_OTHER): Payer: Medicare PPO

## 2021-04-16 VITALS — Wt 247.0 lb

## 2021-04-16 DIAGNOSIS — Z599 Problem related to housing and economic circumstances, unspecified: Secondary | ICD-10-CM | POA: Diagnosis not present

## 2021-04-16 DIAGNOSIS — Z79899 Other long term (current) drug therapy: Secondary | ICD-10-CM

## 2021-04-16 DIAGNOSIS — E119 Type 2 diabetes mellitus without complications: Secondary | ICD-10-CM

## 2021-04-16 DIAGNOSIS — Z0000 Encounter for general adult medical examination without abnormal findings: Secondary | ICD-10-CM

## 2021-04-16 NOTE — Patient Instructions (Signed)
[name] , ?Thank you for taking time to come for your Medicare Wellness Visit. I appreciate your ongoing commitment to your health goals. Please review the following plan we discussed and let me know if I can assist you in the future.  ? ?Screening recommendations/referrals: ?Colonoscopy: no longer required ?Mammogram: last done 2017 - no longer required ?Bone Density: last done in 2017 - no longer required ?Recommended yearly ophthalmology/optometry visit for glaucoma screening and checkup ?Recommended yearly dental visit for hygiene and checkup ? ?Vaccinations: ?Influenza vaccine: Done 10/13/2020 - Repeat annually ?Pneumococcal vaccine: Done 05/14/2013 & 03/19/2018 ?Tdap vaccine: Due - recommended every 10 years ?Shingles vaccine: Zostavax done 2011; Shingrix is 2 doses 2-6 months apart and over 90% effective     ?Covid-19: Done 02/09/2019, 03/06/2019, 12/03/2019, & 11/21/2020 ? ?Advanced directives: Please bring a copy of your health care power of attorney and living will to the office to be added to your chart at your convenience.  ? ?Conditions/risks identified: Aim for 30 minutes of exercise or brisk walking, 6-8 glasses of water, and 5 servings of fruits and vegetables each day.  ? ?Next appointment: Follow up in one year for your annual wellness visit  ? ? ?Preventive Care 81 Years and Older, Female ?Preventive care refers to lifestyle choices and visits with your health care provider that can promote health and wellness. ?What does preventive care include? ?A yearly physical exam. This is also called an annual well check. ?Dental exams once or twice a year. ?Routine eye exams. Ask your health care provider how often you should have your eyes checked. ?Personal lifestyle choices, including: ?Daily care of your teeth and gums. ?Regular physical activity. ?Eating a healthy diet. ?Avoiding tobacco and drug use. ?Limiting alcohol use. ?Practicing safe sex. ?Taking low-dose aspirin every day. ?Taking vitamin and  mineral supplements as recommended by your health care provider. ?What happens during an annual well check? ?The services and screenings done by your health care provider during your annual well check will depend on your age, overall health, lifestyle risk factors, and family history of disease. ?Counseling  ?Your health care provider may ask you questions about your: ?Alcohol use. ?Tobacco use. ?Drug use. ?Emotional well-being. ?Home and relationship well-being. ?Sexual activity. ?Eating habits. ?History of falls. ?Memory and ability to understand (cognition). ?Work and work Astronomer. ?Reproductive health. ?Screening  ?You may have the following tests or measurements: ?Height, weight, and BMI. ?Blood pressure. ?Lipid and cholesterol levels. These may be checked every 5 years, or more frequently if you are over 41 years old. ?Skin check. ?Lung cancer screening. You may have this screening every year starting at age 64 if you have a 30-pack-year history of smoking and currently smoke or have quit within the past 15 years. ?Fecal occult blood test (FOBT) of the stool. You may have this test every year starting at age 21. ?Flexible sigmoidoscopy or colonoscopy. You may have a sigmoidoscopy every 5 years or a colonoscopy every 10 years starting at age 84. ?Hepatitis C blood test. ?Hepatitis B blood test. ?Sexually transmitted disease (STD) testing. ?Diabetes screening. This is done by checking your blood sugar (glucose) after you have not eaten for a while (fasting). You may have this done every 1-3 years. ?Bone density scan. This is done to screen for osteoporosis. You may have this done starting at age 76. ?Mammogram. This may be done every 1-2 years. Talk to your health care provider about how often you should have regular mammograms. ?Talk with your health  care provider about your test results, treatment options, and if necessary, the need for more tests. ?Vaccines  ?Your health care provider may recommend  certain vaccines, such as: ?Influenza vaccine. This is recommended every year. ?Tetanus, diphtheria, and acellular pertussis (Tdap, Td) vaccine. You may need a Td booster every 10 years. ?Zoster vaccine. You may need this after age 33. ?Pneumococcal 13-valent conjugate (PCV13) vaccine. One dose is recommended after age 58. ?Pneumococcal polysaccharide (PPSV23) vaccine. One dose is recommended after age 52. ?Talk to your health care provider about which screenings and vaccines you need and how often you need them. ?This information is not intended to replace advice given to you by your health care provider. Make sure you discuss any questions you have with your health care provider. ?Document Released: 02/03/2015 Document Revised: 09/27/2015 Document Reviewed: 11/08/2014 ?Elsevier Interactive Patient Education ? 2017 [place]. ? ?Fall Prevention in the Home ?Falls can cause injuries. They can happen to people of all ages. There are many things you can do to make your home safe and to help prevent falls. ?What can I do on the outside of my home? ?Regularly fix the edges of walkways and driveways and fix any cracks. ?Remove anything that might make you trip as you walk through a door, such as a raised step or threshold. ?Trim any bushes or trees on the path to your home. ?Use bright outdoor lighting. ?Clear any walking paths of anything that might make someone trip, such as rocks or tools. ?Regularly check to see if handrails are loose or broken. Make sure that both sides of any steps have handrails. ?Any raised decks and porches should have guardrails on the edges. ?Have any leaves, snow, or ice cleared regularly. ?Use sand or salt on walking paths during winter. ?Clean up any spills in your garage right away. This includes oil or grease spills. ?What can I do in the bathroom? ?Use night lights. ?Install grab bars by the toilet and in the tub and shower. Do not use towel bars as grab bars. ?Use non-skid mats or  decals in the tub or shower. ?If you need to sit down in the shower, use a plastic, non-slip stool. ?Keep the floor dry. Clean up any water that spills on the floor as soon as it happens. ?Remove soap buildup in the tub or shower regularly. ?Attach bath mats securely with double-sided non-slip rug tape. ?Do not have throw rugs and other things on the floor that can make you trip. ?What can I do in the bedroom? ?Use night lights. ?Make sure that you have a light by your bed that is easy to reach. ?Do not use any sheets or blankets that are too big for your bed. They should not hang down onto the floor. ?Have a firm chair that has side arms. You can use this for support while you get dressed. ?Do not have throw rugs and other things on the floor that can make you trip. ?What can I do in the kitchen? ?Clean up any spills right away. ?Avoid walking on wet floors. ?Keep items that you use a lot in easy-to-reach places. ?If you need to reach something above you, use a strong step stool that has a grab bar. ?Keep electrical cords out of the way. ?Do not use floor polish or wax that makes floors slippery. If you must use wax, use non-skid floor wax. ?Do not have throw rugs and other things on the floor that can make you trip. ?What can  I do with my stairs? ?Do not leave any items on the stairs. ?Make sure that there are handrails on both sides of the stairs and use them. Fix handrails that are broken or loose. Make sure that handrails are as long as the stairways. ?Check any carpeting to make sure that it is firmly attached to the stairs. Fix any carpet that is loose or worn. ?Avoid having throw rugs at the top or bottom of the stairs. If you do have throw rugs, attach them to the floor with carpet tape. ?Make sure that you have a light switch at the top of the stairs and the bottom of the stairs. If you do not have them, ask someone to add them for you. ?What else can I do to help prevent falls? ?Wear shoes that: ?Do not  have high heels. ?Have rubber bottoms. ?Are comfortable and fit you well. ?Are closed at the toe. Do not wear sandals. ?If you use a stepladder: ?Make sure that it is fully opened. Do not climb a closed stepladd

## 2021-04-16 NOTE — Progress Notes (Signed)
? ?Subjective:  ? Courtney Stone is a 81 y.o. female who presents for Medicare Annual (Subsequent) preventive examination. ? ?Virtual Visit via Telephone Note ? ?I connected with  Courtney Stone on 04/16/21 at  8:15 AM EDT by telephone and verified that I am speaking with the correct person using two identifiers. ? ?Location: ?Patient: Home ?Provider: [facility] ?Persons participating in the virtual visit: patient/Nurse Health Advisor ?  ?I discussed the limitations, risks, security and privacy concerns of performing an evaluation and management service by telephone and the availability of in person appointments. The patient expressed understanding and agreed to proceed. ? ?Interactive audio and video telecommunications were attempted between this nurse and patient, however failed, due to patient having technical difficulties OR patient did not have access to video capability.  We continued and completed visit with audio only. ? ?Some vital signs may be absent or patient reported.  ? ?Courtney Piechota Hurman Horn, LPN  ? ?Review of Systems    ? ?Cardiac Risk Factors include: advanced age (>29men, >5 women);obesity (BMI >30kg/m2);sedentary lifestyle;diabetes mellitus;hypertension;Other (see comment), Risk factor comments: OSA on CPAP, thrombocytopenia ? ?   ?Objective:  ?  ?Today's Vitals  ? 04/16/21 1442 04/16/21 1443  ?Weight: 247 lb (112 kg)   ?PainSc:  5   ? ?Body mass index is 38.69 kg/m?. ? ? ?  04/16/2021  ?  2:52 PM 09/16/2017  ?  1:47 PM 09/05/2015  ?  9:56 AM  ?Advanced Directives  ?Does Patient Have a Medical Advance Directive? Yes Yes Yes  ?Type of Sales promotion account executive of [employer] Power of [place];Living will  ?Does patient want to make changes to medical advance directive?   No - Patient declined  ?Copy of Healthcare Power of Attorney in Chart? No - copy requested No - copy requested   ? ? ?Current Medications (verified) ?Outpatient Encounter Medications  as of 04/16/2021  ?Medication Sig  ? Fluocinolone Acetonide 0.01 % OIL   ? FLUoxetine (PROZAC) 10 MG capsule TAKE 1 CAPSULE(10 MG) BY MOUTH DAILY FOR ANXIETY OR DEPRESSION  ? glipiZIDE (GLUCOTROL XL) 10 MG 24 hr tablet Take 1 tablet (10 mg total) by mouth daily with breakfast. For diabetes. Office visit required for further refills.  ? lisinopril (ZESTRIL) 10 MG tablet Take 1 tablet (10 mg total) by mouth daily. For blood pressure. Office visit required for further refills.  ? metFORMIN (GLUCOPHAGE-XR) 500 MG 24 hr tablet Take 2 tablets (1,000 mg total) by mouth 2 (two) times daily with a meal. For diabetes. Office visit required for further refills.  ? sitaGLIPtin (JANUVIA) 100 MG tablet Take 1 tablet (100 mg total) by mouth daily. for diabetes.Office visit required for further refills.  ? mometasone (NASONEX) 50 MCG/ACT nasal spray SHAKE LIQUID AND USE 2 SPRAYS NASALLY DAILY (Patient not taking: Reported on 04/16/2021)  ? ?No facility-administered encounter medications on file as of 04/16/2021.  ? ? ?Allergies (verified) ?Adhesive [tape], Sulfa antibiotics, and Latex  ? ?History: ?Past Medical History:  ?Diagnosis Date  ? Anxiety and depression   ? Diabetes mellitus   ? HTN (hypertension)   ? 120/80's at home  ? Thyroid disorder   ? ?Past Surgical History:  ?Procedure Laterality Date  ? ABDOMINAL HYSTERECTOMY  1976  ? complete  ? BREAST BIOPSY Right   ? neg  ? BREAST SURGERY  1968  ? benign  ? CATARACT EXTRACTION, BILATERAL  2016  ? [facility]   ?  NECK SURGERY  1993  ? ?bone spur removal  ? URETHRA SURGERY    ? WRIST FRACTURE SURGERY    ? ?Family History  ?Problem Relation Age of Onset  ? Cancer Mother   ?     lung  ? Cancer Father   ? COPD Father   ? Diabetes Sister   ? Hypertension Sister   ? Breast cancer Paternal Grandmother [id number]  ? ?Social History  ? ?Socioeconomic History  ? Marital status: Married  ?  Spouse name: Leonette MostCharles  ? Number of children: 2  ? Years of education: Not on file  ? Highest education  level: Not on file  ?Occupational History  ? Occupation: retired  ?  Comment: school Magazine features editorteacher/ librarian  ?Tobacco Use  ? Smoking status: Never  ? Smokeless tobacco: Never  ?Vaping Use  ? Vaping Use: Never used  ?Substance and Sexual Activity  ? Alcohol use: No  ? Drug use: No  ? Sexual activity: Never  ?Other Topics Concern  ? Not on file  ?Social History Narrative  ? Married.   ? 2 children, 2 grand children.  ? [place] in [place]  ? Retired, once worked as a Runner, broadcasting/film/videoteacher.   ? Enjoys spending time with family, reading.   ? ?Social Determinants of Health  ? ?Financial Resource Strain: Low Risk   ? Difficulty of Paying Living Expenses: Not hard at all  ?Food Insecurity: No Food Insecurity  ? Worried About Programme researcher, broadcasting/film/videounning Out of Food in the Last Year: Never true  ? Ran Out of Food in the Last Year: Never true  ?Transportation Needs: No Transportation Needs  ? Lack of Transportation (Medical): No  ? Lack of Transportation (Non-Medical): No  ?Physical Activity: Inactive  ? Days of Exercise per Week: 0 days  ? Minutes of Exercise per Session: 0 min  ?Stress: No Stress Concern Present  ? Feeling of Stress : Only a little  ?Social Connections: Socially Integrated  ? Frequency of Communication with Friends and Family: More than three times a week  ? Frequency of Social Gatherings with Friends and Family: More than three times a week  ? Attends Religious Services: More than 4 times per year  ? Active Member of Clubs or Organizations: Yes  ? Attends BankerClub or Organization Meetings: More than 4 times per year  ? Marital Status: Married  ? ? ?Tobacco Counseling ?Counseling given: Not Answered ? ? ?Clinical Intake: ? ?Pre-visit preparation completed: Yes ? ?Pain : 0-10 ?Pain Score: 5  ?Pain Type: Chronic pain ?Pain Location: Knee ?Pain Orientation: Right, Left ?Pain Descriptors / Indicators: Aching, Tender, Discomfort ?Pain Onset: More than a month ago ?Pain Frequency: Intermittent ? ?  ? ?BMI - recorded: 38.69 ?Nutritional Status: BMI > 30   Obese ?Nutritional Risks: None ?Diabetes: Yes ?CBG done?: No ?Did pt. bring in CBG monitor from home?: No ? ?How often do you need to have someone help you when you read instructions, pamphlets, or other written materials from your doctor or pharmacy?: 1 - Never ? ?Diabetic? Nutrition Risk Assessment: ? ?Has the patient had any N/V/D within the last 2 months?  No  ?Does the patient have any non-healing wounds?  No  ?Has the patient had any unintentional weight loss or weight gain?  No  ? ?Diabetes: ? ?Is the patient diabetic?  Yes  ?If diabetic, was a CBG obtained today?  No  ?Did the patient bring in their glucometer from home?  No  ?How often do you monitor  your CBG's? Once per week.  ? ?Financial Strains and Diabetes Management: ? ?Are you having any financial strains with the device, your supplies or your medication? Yes .  ?Does the patient want to be seen by Chronic Care Management for management of their diabetes?  Yes  ?Would the patient like to be referred to a Nutritionist or for Diabetic Management?  No  ? ?Diabetic Exams: ? ?Diabetic Eye Exam: Completed 03/2021.  ? ?Diabetic Foot Exam: Completed 05/09/20. Pt has been advised about the importance in completing this exam. Pt is scheduled for diabetic foot exam on next appt 04/25/21.   ? ?Interpreter Needed?: No ? ?Information entered by :: Raena Pau, LPN ? ? ?Activities of Daily Living ? ?  04/16/2021  ?  2:53 PM  ?In your present state of health, do you have any difficulty performing the following activities:  ?Hearing? 1  ?Vision? 0  ?Difficulty concentrating or making decisions? 0  ?Walking or climbing stairs? 1  ?Comment hurts knees  ?Dressing or bathing? 0  ?Doing errands, shopping? 0  ?Preparing Food and eating ? N  ?Using the Toilet? N  ?In the past six months, have you accidently leaked urine? N  ?Do you have problems with loss of bowel control? N  ?Managing your Medications? N  ?Managing your Finances? N  ?Housekeeping or managing your Housekeeping?  N  ? ? ?Patient Care Team: ?Doreene Nest, NP as PCP - General (Internal Medicine) ?Rudi Rummage, MD as Referring Physician (Ophthalmology) ? ?Indicate any recent Medical Services

## 2021-04-18 ENCOUNTER — Telehealth: Payer: Self-pay | Admitting: Primary Care

## 2021-04-18 NOTE — Progress Notes (Signed)
?  Chronic Care Management  ? ?Note ? ?04/18/2021 ?Name: Courtney Stone MRN: [id number] DOB: [birth year] ? ?Courtney Stone is a 81 y.o. year old female who is a primary care patient of Doreene Nest, NP. I reached out to Courtney Stone by phone today in response to a referral sent by Courtney Stone PCP, Doreene Nest, NP.  ? ?Courtney Stone was given information about Chronic Care Management services today including:  ?CCM service includes personalized support from designated clinical staff supervised by her physician, including individualized plan of care and coordination with other care providers ?24/7 contact phone numbers for assistance for urgent and routine care needs. ?Service will only be billed when office clinical staff spend 20 minutes or more in a month to coordinate care. ?Only one practitioner may furnish and bill the service in a calendar month. ?The patient may stop CCM services at any time (effective at the end of the month) by phone call to the office staff. ? ? ?Patient agreed to services and verbal consent obtained.  ? ?Follow up plan:PT AWARE $20 COPAY ? ? ?Courtney Stone ?Upstream Scheduler  ?

## 2021-04-18 NOTE — Chronic Care Management (AMB) (Signed)
?  Chronic Care Management  ? ?Outreach Note ? ?04/18/2021 ?Name: MAHAYLA HADDAWAY MRN: [id number] DOB: [birth year] ? ?Referred by: Doreene Nest, NP ?Reason for referral : No chief complaint on file. ? ? ?An unsuccessful telephone outreach was attempted today. The patient was referred to the pharmacist for assistance with care management and care coordination.  ? ?Follow Up Plan:  ? ?Tatjana Dellinger ?Upstream Scheduler  ?

## 2021-04-25 ENCOUNTER — Ambulatory Visit: Payer: Medicare PPO

## 2021-04-25 ENCOUNTER — Ambulatory Visit (INDEPENDENT_AMBULATORY_CARE_PROVIDER_SITE_OTHER): Payer: Medicare PPO | Admitting: Primary Care

## 2021-04-25 ENCOUNTER — Ambulatory Visit (INDEPENDENT_AMBULATORY_CARE_PROVIDER_SITE_OTHER)
Admission: RE | Admit: 2021-04-25 | Discharge: 2021-04-25 | Disposition: A | Discharge status: Home / Self Care | Payer: Medicare PPO | Source: Ambulatory Visit | Attending: Primary Care | Admitting: Primary Care

## 2021-04-25 VITALS — BP 132/76 | HR 71 | Ht 67.0 in | Wt 246.8 lb

## 2021-04-25 DIAGNOSIS — M25562 Pain in left knee: Secondary | ICD-10-CM | POA: Diagnosis not present

## 2021-04-25 DIAGNOSIS — G8929 Other chronic pain: Secondary | ICD-10-CM | POA: Diagnosis not present

## 2021-04-25 DIAGNOSIS — M25561 Pain in right knee: Secondary | ICD-10-CM

## 2021-04-25 DIAGNOSIS — F4323 Adjustment disorder with mixed anxiety and depressed mood: Secondary | ICD-10-CM

## 2021-04-25 DIAGNOSIS — G4733 Obstructive sleep apnea (adult) (pediatric): Secondary | ICD-10-CM

## 2021-04-25 DIAGNOSIS — Z0001 Encounter for general adult medical examination with abnormal findings: Secondary | ICD-10-CM

## 2021-04-25 DIAGNOSIS — I10 Essential (primary) hypertension: Secondary | ICD-10-CM

## 2021-04-25 DIAGNOSIS — J302 Other seasonal allergic rhinitis: Secondary | ICD-10-CM | POA: Diagnosis not present

## 2021-04-25 DIAGNOSIS — E119 Type 2 diabetes mellitus without complications: Secondary | ICD-10-CM

## 2021-04-25 DIAGNOSIS — Z9989 Dependence on other enabling machines and devices: Secondary | ICD-10-CM | POA: Diagnosis not present

## 2021-04-25 LAB — CBC
HCT: 40.9 % (ref 36.0–46.0)
Hemoglobin: 13.5 g/dL (ref 12.0–15.0)
MCHC: 32.9 g/dL (ref 30.0–36.0)
MCV: 92.3 fl (ref 78.0–100.0)
Platelets: 144 10*3/uL — ABNORMAL LOW (ref 150.0–400.0)
RBC: 4.44 Mil/uL (ref 3.87–5.11)
RDW: 13.7 % (ref 11.5–15.5)
WBC: 7.4 10*3/uL (ref 4.0–10.5)

## 2021-04-25 LAB — LIPID PANEL
Cholesterol: 114 mg/dL (ref 0–200)
HDL: 44.7 mg/dL (ref >39.00)
LDL Cholesterol: 51 mg/dL (ref 0–99)
NonHDL: 69.49
Total CHOL/HDL Ratio: 3
Triglycerides: 92 mg/dL (ref 0.0–149.0)
VLDL: 18.4 mg/dL (ref 0.0–40.0)

## 2021-04-25 LAB — COMPREHENSIVE METABOLIC PANEL
ALT: 12 U/L (ref 0–35)
AST: 16 U/L (ref 0–37)
Albumin: 4.1 g/dL (ref 3.5–5.2)
Alkaline Phosphatase: 65 U/L (ref 39–117)
BUN: 16 mg/dL (ref 6–23)
CO2: 26 mEq/L (ref 19–32)
Calcium: 9.7 mg/dL (ref 8.4–10.5)
Chloride: 105 mEq/L (ref 96–112)
Creatinine, Ser: 0.97 mg/dL (ref 0.40–1.20)
GFR: 55.15 mL/min — ABNORMAL LOW (ref >60.00)
Glucose, Bld: 147 mg/dL — ABNORMAL HIGH (ref 70–99)
Potassium: 4.2 mEq/L (ref 3.5–5.1)
Sodium: 138 mEq/L (ref 135–145)
Total Bilirubin: 1 mg/dL (ref 0.2–1.2)
Total Protein: 6.3 g/dL (ref 6.0–8.3)

## 2021-04-25 LAB — POCT GLYCOSYLATED HEMOGLOBIN (HGB A1C): Hemoglobin A1C: 8.2 % — AB (ref 4.0–5.6)

## 2021-04-25 MED ORDER — FLUTICASONE PROPIONATE 50 MCG/ACT NA SUSP: 1.0000 | Freq: Two times a day (BID) | NASAL | 0 refills | Status: DC

## 2021-04-25 NOTE — Assessment & Plan Note (Signed)
Discussed Shingrix vaccine, she will obtain from her pharmacy.  Other vaccines up-to-date. ? ?Declines mammogram and bone density scan. ?Colonoscopy N/A given age. ? ?Encouraged healthy diet, walking when able. ? ?Exam today as noted. ?Labs pending. ?

## 2021-04-25 NOTE — Patient Instructions (Addendum)
Stop by the lab and xray prior to leaving today. I will notify you of your results once received.  ? ?You will be contacted regarding your referral to orthopedics.  Please let us know if you have not been contacted within two weeks.  ? ?You can take Tylenol 500 mg 3 times daily for pain. ? ?Please schedule a follow up visit for 3 months for diabetes check. ? ?It was a pleasure to see you today! ? ?Preventive Care 32 Years and Older, Female ?Preventive care refers to lifestyle choices and visits with your health care provider that can promote health and wellness. Preventive care visits are also called wellness exams. ?What can I expect for my preventive care visit? ?Counseling ?Your health care provider may ask you questions about your: ?Medical history, including: ?Past medical problems. ?Family medical history. ?Pregnancy and menstrual history. ?History of falls. ?Current health, including: ?Memory and ability to understand (cognition). ?Emotional well-being. ?Home life and relationship well-being. ?Sexual activity and sexual health. ?Lifestyle, including: ?Alcohol, nicotine or tobacco, and drug use. ?Access to firearms. ?Diet, exercise, and sleep habits. ?Work and work Statistician. ?Sunscreen use. ?Safety issues such as seatbelt and bike helmet use. ?Physical exam ?Your health care provider will check your: ?Height and weight. These may be used to calculate your BMI (body mass index). BMI is a measurement that tells if you are at a healthy weight. ?Waist circumference. This measures the distance around your waistline. This measurement also tells if you are at a healthy weight and may help predict your risk of certain diseases, such as type 2 diabetes and high blood pressure. ?Heart rate and blood pressure. ?Body temperature. ?Skin for abnormal spots. ?What immunizations do I need? ?Vaccines are usually given at various ages, according to a schedule. Your health care provider will recommend vaccines for you based  on your age, medical history, and lifestyle or other factors, such as travel or where you work. ?What tests do I need? ?Screening ?Your health care provider may recommend screening tests for certain conditions. This may include: ?Lipid and cholesterol levels. ?Hepatitis C test. ?Hepatitis B test. ?HIV (human immunodeficiency virus) test. ?STI (sexually transmitted infection) testing, if you are at risk. ?Lung cancer screening. ?Colorectal cancer screening. ?Diabetes screening. This is done by checking your blood sugar (glucose) after you have not eaten for a while (fasting). ?Mammogram. Talk with your health care provider about how often you should have regular mammograms. ?BRCA-related cancer screening. This may be done if you have a family history of breast, ovarian, tubal, or peritoneal cancers. ?Bone density scan. This is done to screen for osteoporosis. ?Talk with your health care provider about your test results, treatment options, and if necessary, the need for more tests. ?Follow these instructions at home: ?Eating and drinking ? ?Eat a diet that includes fresh fruits and vegetables, whole grains, lean protein, and low-fat dairy products. Limit your intake of foods with high amounts of sugar, saturated fats, and salt. ?Take vitamin and mineral supplements as recommended by your health care provider. ?Do not drink alcohol if your health care provider tells you not to drink. ?If you drink alcohol: ?Limit how much you have to 0-1 drink a day. ?Know how much alcohol is in your drink. In the [place] one drink equals one 12 oz bottle of beer (355 mL), one 5 oz glass of wine (148 mL), or one 1? oz glass of hard liquor (44 mL). ?Lifestyle ?Brush your teeth every morning and night with fluoride toothpaste. Floss  one time each day. ?Exercise for at least 30 minutes 5 or more days each week. ?Do not use any products that contain nicotine or tobacco. These products include cigarettes, chewing tobacco, and vaping devices,  such as e-cigarettes. If you need help quitting, ask your health care provider. ?Do not use drugs. ?If you are sexually active, practice safe sex. Use a condom or other form of protection in order to prevent STIs. ?Take aspirin only as told by your health care provider. Make sure that you understand how much to take and what form to take. Work with your health care provider to find out whether it is safe and beneficial for you to take aspirin daily. ?Ask your health care provider if you need to take a cholesterol-lowering medicine (statin). ?Find healthy ways to manage stress, such as: ?Meditation, yoga, or listening to music. ?Journaling. ?Talking to a trusted person. ?Spending time with friends and family. ?Minimize exposure to UV radiation to reduce your risk of skin cancer. ?Safety ?Always wear your seat belt while driving or riding in a vehicle. ?Do not drive: ?If you have been drinking alcohol. Do not ride with someone who has been drinking. ?When you are tired or distracted. ?While texting. ?If you have been using any mind-altering substances or drugs. ?Wear a helmet and other protective equipment during sports activities. ?If you have firearms in your house, make sure you follow all gun safety procedures. ?What's next? ?Visit your health care provider once a year for an annual wellness visit. ?Ask your health care provider how often you should have your eyes and teeth checked. ?Stay up to date on all vaccines. ?This information is not intended to replace advice given to you by your health care provider. Make sure you discuss any questions you have with your health care provider. ?Document Revised: 07/05/2020 Document Reviewed: 07/05/2020 ?Elsevier Patient Education ? 2022 [employer]vier Inc[employer] ? ?

## 2021-04-25 NOTE — Assessment & Plan Note (Signed)
Controlled.   Continue lisinopril 10 mg daily.  CMP pending.  

## 2021-04-25 NOTE — Assessment & Plan Note (Signed)
Well-controlled and without difficulty weaning down on fluoxetine dose. ? ?She would like to wean off fluoxetine altogether.  We will have her take 10 mg every other day for 2 weeks then stop. ? ?She will update if she experiences any problems. ?

## 2021-04-25 NOTE — Assessment & Plan Note (Signed)
Chronic and worse. ? ?Checking plain films of both knees today. ?Referral placed for orthopedics today.  ? ?Discussed OTC use of Tylenol.  ?

## 2021-04-25 NOTE — Assessment & Plan Note (Addendum)
A1c today of 8.0 which is an increase compared to last visit. ? ?Suspect this is largely secondary to her sedentary lifestyle due to chronic knee pain. ? ?She will work on her diet and try to walk some. ? ?Continue metformin XR 1000 mg BID, Glipizide XL 10 mg daily, Januvia 100 mg daily. ? ?Discussed to monitor her glucose levels and notify if she consistently sees readings at or above 150. ? ?Follow-up in 3 months. ? ? ?

## 2021-04-25 NOTE — Progress Notes (Signed)
? ?Subjective:  ? ? Patient ID: Courtney Stone, female    DOB: March 11, 1940, 81 y.o.   MRN: [id number] ? ?HPI ? ?Courtney Stone is a very pleasant 81 y.o. female who presents today for complete physical and follow up of chronic conditions. ? ?She would also like to discuss chronic bilateral knee pain. Historically to the left knee, about 1 month ago she noticed right knee pain. She has difficulty climbing stairs, walking, rising from a seated position, sitting for too long due to chronic bilateral knee pain.  ? ?She denies swelling to her knees, but has noticed increased swelling to her bilateral ankles. She is very sedentary during the day due to her knee pain.  ? ?She's tried numerous OTC topical agents without improvement. She does take Tylenol on occasion with some improvement. She's never undergone evaluation of her knees.  ? ?Immunizations: ?-Influenza: Completed this season ?-Covid-19: 4 vaccines ?-Shingles: Completed Zostavax, Never completed Shigrix ?-Pneumonia: Prevnar 13 in 2015, Pneumovax 23 in 2020 ? ?Diet: Fair diet.  ?Exercise: No regular exercise. ? ?Eye exam: Completes annually  ?Dental exam: Completes semi-annually  ? ?Mammogram: Completed years ago. Declines today.  ?Colonoscopy: Completed in 2013, N/A due to age ? ?Dexa: Completed years ago. Declines today.  ? ?BP Readings from Last 3 Encounters:  ?04/25/21 132/76  ?04/09/21 139/90  ?02/08/21 (!) 146/90  ? ? ? ? ? ?Review of Systems  ?Constitutional:  Negative for unexpected weight change.  ?HENT:  Negative for rhinorrhea.   ?Eyes:  Negative for visual disturbance.  ?Respiratory:  Negative for shortness of breath.   ?Cardiovascular:  Negative for chest pain.  ?Gastrointestinal:  Negative for constipation and diarrhea.  ?Genitourinary:  Negative for difficulty urinating.  ?Musculoskeletal:  Positive for arthralgias. Negative for joint swelling.  ?Skin:  Negative for rash.  ?Allergic/Immunologic: Positive for environmental allergies.  ?Neurological:   Negative for dizziness, numbness and headaches.  ?Psychiatric/Behavioral:  The patient is not nervous/anxious.   ? ?   ? ? ?Past Medical History:  ?Diagnosis Date  ? Anxiety and depression   ? Diabetes mellitus   ? HTN (hypertension)   ? 120/80's at home  ? Thyroid disorder   ? ? ?Social History  ? ?Socioeconomic History  ? Marital status: Married  ?  Spouse name: Leonette Most  ? Number of children: 2  ? Years of education: Not on file  ? Highest education level: Not on file  ?Occupational History  ? Occupation: retired  ?  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 Smoking status: Never  ? Smokeless tobacco: Never  ?Vaping Use  ? Vaping Use: Never used  ?Substance and Sexual Activity  ? Alcohol use: No  ? Drug use: No  ? Sexual activity: Never  ?Other Topics Concern  ? Not on file  ?Social History Narrative  ? Married.   ? 2 children, 2 grand children.  ? Lives in [place].  ? Retired, once worked as a Runner, broadcasting/film/video.   ? Enjoys spending time with family, reading.   ? ?Social Determinants of Health  ? ?Financial Resource Strain: Low Risk   ? Difficulty of Paying Living Expenses: Not hard at all  ?Food Insecurity: No Food Insecurity  ? Worried About Programme researcher, broadcasting/film/video in the Last Year: Never true  ? Ran Out of Food in the Last Year: Never true  ?Transportation Needs: No Transportation Needs  ? Lack of Transportation (Medical): No  ? Lack of Transportation (Non-Medical): No  ?Physical  Activity: Inactive  ? Days of Exercise per Week: 0 days  ? Minutes of Exercise per Session: 0 min  ?Stress: No Stress Concern Present  ? Feeling of Stress : Only a little  ?Social Connections: Socially Integrated  ? Frequency of Communication with Friends and Family: More than three times a week  ? Frequency of Social Gatherings with Friends and Family: More than three times a week  ? Attends Religious Services: More than 4 times per year  ? Active Member of Clubs or Organizations: Yes  ? Attends Banker Meetings: More  than 4 times per year  ? Marital Status: Married  ?Intimate Partner Violence: Not At Risk  ? Fear of Current or Ex-Partner: No  ? Emotionally Abused: No  ? Physically Abused: No  ? Sexually Abused: No  ? ? ?Past Surgical History:  ?Procedure Laterality Date  ? ABDOMINAL HYSTERECTOMY  1976  ? complete  ? BREAST BIOPSY Right   ? neg  ? BREAST SURGERY  1968  ? benign  ? CATARACT EXTRACTION, BILATERAL  2016  ? [facility]   ? NECK SURGERY  1993  ? ?bone spur removal  ? URETHRA SURGERY    ? WRIST FRACTURE SURGERY    ? ? ?Family History  ?Problem Relation Age of Onset  ? Cancer Mother   ?     lung  ? Cancer Father   ? COPD Father   ? Diabetes Sister   ? Hypertension Sister   ? Breast cancer Paternal Grandmother 41  ? ? ?Allergies  ?Allergen Reactions  ? Adhesive [Tape]   ? Sulfa Antibiotics   ?  Headache  ? Latex Rash  ? ? ?Current Outpatient Medications on File Prior to Visit  ?Medication Sig Dispense Refill  ? Fluocinolone Acetonide 0.01 % OIL     ? FLUoxetine (PROZAC) 10 MG capsule TAKE 1 CAPSULE(10 MG) BY MOUTH DAILY FOR ANXIETY OR DEPRESSION 30 capsule 0  ? glipiZIDE (GLUCOTROL XL) 10 MG 24 hr tablet Take 1 tablet (10 mg total) by mouth daily with breakfast. For diabetes. Office visit required for further refills. 30 tablet 0  ? lisinopril (ZESTRIL) 10 MG tablet Take 1 tablet (10 mg total) by mouth daily. For blood pressure. Office visit required for further refills. 30 tablet 0  ? metFORMIN (GLUCOPHAGE-XR) 500 MG 24 hr tablet Take 2 tablets (1,000 mg total) by mouth 2 (two) times daily with a meal. For diabetes. Office visit required for further refills. 360 tablet 0  ? mometasone (NASONEX) 50 MCG/ACT nasal spray SHAKE LIQUID AND USE 2 SPRAYS NASALLY DAILY 17 g 5  ? sitaGLIPtin (JANUVIA) 100 MG tablet Take 1 tablet (100 mg total) by mouth daily. for diabetes.Office visit required for further refills. 30 tablet 0  ? ?No current facility-administered medications on file prior to visit.  ? ? ?BP 132/76    Pulse 71   Ht 5\' 7"  (1.702 m)   Wt 246 lb 12.8 oz (111.9 kg)   SpO2 98%   BMI 38.65 kg/m?  ?Objective:  ? Physical Exam ?HENT:  ?   Right Ear: Tympanic membrane and ear canal normal.  ?   Left Ear: Tympanic membrane and ear canal normal.  ?   Nose: Nose normal.  ?Eyes:  ?   Conjunctiva/sclera: Conjunctivae normal.  ?   Pupils: Pupils are equal, round, and reactive to light.  ?Neck:  ?   Thyroid: No thyromegaly.  ?Cardiovascular:  ?   Rate and Rhythm: Normal  rate and regular rhythm.  ?   Heart sounds: No murmur heard. ?Pulmonary:  ?   Effort: Pulmonary effort is normal.  ?   Breath sounds: Normal breath sounds. No rales.  ?Abdominal:  ?   General: Bowel sounds are normal.  ?   Palpations: Abdomen is soft.  ?   Tenderness: There is no abdominal tenderness.  ?Musculoskeletal:  ?   Cervical back: Neck supple.  ?   Right knee: No swelling. Decreased range of motion. No tenderness.  ?   Left knee: No swelling. Decreased range of motion. No tenderness.  ?Lymphadenopathy:  ?   Cervical: No cervical adenopathy.  ?Skin: ?   General: Skin is warm and dry.  ?   Findings: No rash.  ?Neurological:  ?   Mental Status: She is alert and oriented to person, place, and time.  ?   Cranial Nerves: No cranial nerve deficit.  ?   Deep Tendon Reflexes: Reflexes are normal and symmetric.  ? ? ? ? ? ?   ?Assessment & Plan:  ? ? ? ? ?This visit occurred during the SARS-CoV-2 public health emergency.  Safety protocols were in place, including screening questions prior to the visit, additional usage of staff PPE, and extensive cleaning of exam room while observing appropriate contact time as indicated for disinfecting solutions.  ?

## 2021-04-25 NOTE — Assessment & Plan Note (Signed)
Compliant to CPAP nightly. ?Following with pulmonology. ?

## 2021-04-28 ENCOUNTER — Other Ambulatory Visit: Payer: Self-pay | Admitting: Primary Care

## 2021-04-28 DIAGNOSIS — I10 Essential (primary) hypertension: Secondary | ICD-10-CM

## 2021-04-28 DIAGNOSIS — E119 Type 2 diabetes mellitus without complications: Secondary | ICD-10-CM

## 2021-05-03 ENCOUNTER — Other Ambulatory Visit: Payer: Self-pay | Admitting: Primary Care

## 2021-05-03 DIAGNOSIS — F4323 Adjustment disorder with mixed anxiety and depressed mood: Secondary | ICD-10-CM

## 2021-05-08 ENCOUNTER — Encounter: Payer: Self-pay | Admitting: *Deleted

## 2021-05-14 ENCOUNTER — Telehealth: Payer: Self-pay

## 2021-05-14 NOTE — Chronic Care Management (AMB) (Signed)
? ? ?  Chronic Care Management ?Pharmacy Assistant  ? ?Name: Courtney Stone  MRN: [id number] DOB: 19-Sep-1940 ? ?Courtney Stone is an 81 y.o. year old female who presents for his initial CCM visit with the clinical pharmacist. ? ?Reason for Encounter: Initial Questions ?  ?Conditions to be addressed/monitored: ?HTN and DMII ? ? ?Recent office visits:  ?04/25/21-Katherine Clark,NP(PCP)AWV-Labs ordered(Kidney function is overall stable.Blood counts appear stable.Liver, electrolytes, cholesterol look good.)Referral to orthopedics-take tylenol 500mg  TID for pain. ? ?Recent consult visits:  ?04/09/21-Saadat Khan,MD(Internal Medicine)-Follow up sleep apnea-counseled on CPAP and weight loss ?02/08/21-Cristina Cherghe,MD(Internal Medicine)-Follow up subclinical thryotoxicosis-labs ordered (TFT normal) ? ?Hospital visits:  ?None in previous 6 months ? ?Medications: ?Outpatient Encounter Medications as of 05/14/2021  ?Medication Sig  ? Fluocinolone Acetonide 0.01 % OIL   ? FLUoxetine (PROZAC) 10 MG capsule TAKE 1 CAPSULE(10 MG) BY MOUTH DAILY FOR ANXIETY OR DEPRESSION  ? fluticasone (FLONASE) 50 MCG/ACT nasal spray Place 1 spray into both nostrils 2 (two) times daily.  ? glipiZIDE (GLUCOTROL XL) 10 MG 24 hr tablet TAKE 1 TABLET BY MOUTH DAILY WITH BREAKFAST FOR DIABETES  ? lisinopril (ZESTRIL) 10 MG tablet TAKE 1 TABLET BY MOUTH DAILY FOR BLOOD PRESSURE  ? metFORMIN (GLUCOPHAGE-XR) 500 MG 24 hr tablet Take 2 tablets (1,000 mg total) by mouth 2 (two) times daily with a meal. For diabetes. Office visit required for further refills.  ? sitaGLIPtin (JANUVIA) 100 MG tablet Take 1 tablet (100 mg total) by mouth daily. for diabetes.Office visit required for further refills.  ? ?No facility-administered encounter medications on file as of 05/14/2021.  ? ? ?Lab Results  ?Component Value Date/Time  ? HGBA1C 8.2 (A) 04/25/2021 09:36 AM  ? HGBA1C 7.0 (A) 05/09/2020 09:06 AM  ? HGBA1C 7.6 (H) 10/11/2019 12:47 PM  ? HGBA1C 8.8 (H) 10/15/2018 10:38  AM  ? MICROALBUR <0.7 02/01/2015 10:18 AM  ? MICROALBUR <0.7 06/13/2014 11:28 AM  ?  ? ?BP Readings from Last 3 Encounters:  ?04/25/21 132/76  ?04/09/21 139/90  ?02/08/21 (!) 146/90  ? ? ? ? ?Patient contacted to confirm telephone appointment with 02/10/21, Pharm D, on 05/17/21 at 9:30am. ? ?Do you have any problems getting your medications? No ? ?What is your top health concern you would like to discuss at your upcoming visit? Current concern is knee pain  ? ?Have you seen any other providers since your last visit with PCP? No ? ? ? ?Star Rating Drugs:  ?Medication:  Last Fill: Day Supply ?Glipizide 10mg  04/30/21 90 ?Lisinopril 10mg  04/30/21 90 ?Januvia 100mg  04/09/21 30 ?Metformin 500mg  12/11/20 90  ? ? ?Care Gaps: ?Annual wellness visit in last year? Yes ?Most Recent BP reading:132/76  71-P  04/25/21 ? ?If Diabetic: ?Most recent A1C reading:8.2  04/25/21 ?Last eye exam / retinopathy screening:UTD ?Last diabetic foot exam:2022 ? ?04/11/21, CPP notified ? ?Jara Feider, CCMA ?Health concierge  ?[phone]  ?

## 2021-05-17 ENCOUNTER — Ambulatory Visit (INDEPENDENT_AMBULATORY_CARE_PROVIDER_SITE_OTHER): Payer: Medicare PPO | Admitting: Pharmacist

## 2021-05-17 DIAGNOSIS — I10 Essential (primary) hypertension: Secondary | ICD-10-CM

## 2021-05-17 DIAGNOSIS — E119 Type 2 diabetes mellitus without complications: Secondary | ICD-10-CM

## 2021-05-17 DIAGNOSIS — F4323 Adjustment disorder with mixed anxiety and depressed mood: Secondary | ICD-10-CM

## 2021-05-17 MED ORDER — METFORMIN HCL ER 500 MG PO TB24: 1000.0000 mg | ORAL_TABLET | Freq: Two times a day (BID) | ORAL | 0 refills | Status: DC

## 2021-05-17 NOTE — Progress Notes (Signed)
? ?Chronic Care Management ?Pharmacy Note ? ?05/17/2021 ?Name:  Courtney Stone MRN:  [id number] DOB:  [birth year] ? ?Summary: CCM Initial visit ?-Pt endorses compliance with medications. She does separate Januvia from other meds due to general concern for taking too many meds at once, which causes her to forget this med on occasion. Discussed there is no medical need to separate her medications like this. ?-Pt reports Januvia is more expensive than other meds but she has never had to go without it due to this. There is no assistance available for Januvia for Medicare, unfortunately. ?-A1c 8.2% (04/2021), increased from 7.0% last year. A1c goal < 8% is reasonable given age. Discussed diet changes at length. Pt is returning in July for repeat A1c. ?-Pt has weaned off fluoxetine and denies any issues. ? ?Recommendations/Changes made from today's visit: ?-No med changes. Advised to maximize protein/vegetables, limit carbs. Can consider switching Januvia to Rybelsus if needed in future - PAP is available for that if needed. ?-Refilled metformin (last Rx 11/2020 x 3 month supply). ? ?Plan: ?-PCP F/U 07/25/21 ?-The patient has been provided with contact information for the care management team and has been advised to call with any health related questions or concerns.  ? ? ? ?Subjective: ?Courtney Stone is an 81 y.o. year old female who is a primary patient of Pleas Koch, NP.  The CCM team was consulted for assistance with disease management and care coordination needs.   ? ?Engaged with patient by telephone for initial visit in response to provider referral for pharmacy case management and/or care coordination services.  ? ?Consent to Services:  ?The patient was given the following information about Chronic Care Management services today, agreed to services, and gave verbal consent: 1. CCM service includes personalized support from designated clinical staff supervised by the primary care provider, including  individualized plan of care and coordination with other care providers 2. 24/7 contact phone numbers for assistance for urgent and routine care needs. 3. Service will only be billed when office clinical staff spend 20 minutes or more in a month to coordinate care. 4. Only one practitioner may furnish and bill the service in a calendar month. 5.The patient may stop CCM services at any time (effective at the end of the month) by phone call to the office staff. 6. The patient will be responsible for cost sharing (co-pay) of up to 20% of the service fee (after annual deductible is met). Patient agreed to services and consent obtained. ? ?Patient Care Team: ?Pleas Koch, NP as PCP - General (Internal Medicine) ?Audie Box, MD as Referring Physician (Ophthalmology) ?Jais Demir, Cleaster [facility], [facility] as Pharmacist (Pharmacist) ? ?Recent office visits: ?04/25/21-Katherine Clark,NP(PCP): AWV-Labs ordered(Kidney function is overall stable.Blood counts appear stable.Liver, electrolytes, cholesterol look good.)Referral to orthopedics-take tylenol 58m TID for pain. Wean off fluoxetine - 10 mg QOD x 2 weeks then stop. ? ?Recent consult visits: ?04/09/21-Saadat Khan,MD(Internal Medicine)-Follow up sleep apnea-counseled on CPAP and weight loss ? ?02/08/21-Cristina Cherghe,MD(Endocrine): Follow up subclinical thryotoxicosis-labs ordered (TFT normal) ? ?Hospital visits: ?None in previous 6 months ? ? ?Objective: ? ?Lab Results  ?Component Value Date  ? CREATININE 0.97 04/25/2021  ? BUN 16 04/25/2021  ? GFR 55.15 (L) 04/25/2021  ? NA 138 04/25/2021  ? K 4.2 04/25/2021  ? CALCIUM 9.7 04/25/2021  ? CO2 26 04/25/2021  ? GLUCOSE 147 (H) 04/25/2021  ? ? ?Lab Results  ?Component Value Date/Time  ? HGBA1C 8.2 (A) 04/25/2021 09:36 AM  ?  HGBA1C 7.0 (A) 05/09/2020 09:06 AM  ? HGBA1C 7.6 (H) 10/11/2019 12:47 PM  ? HGBA1C 8.8 (H) 10/15/2018 10:38 AM  ? GFR 55.15 (L) 04/25/2021 10:19 AM  ? GFR 62.80 10/11/2019 12:47 PM  ? MICROALBUR  <0.7 02/01/2015 10:18 AM  ? MICROALBUR <0.7 06/13/2014 11:28 AM  ?  ?Last diabetic Eye exam:  ?Lab Results  ?Component Value Date/Time  ? HMDIABEYEEXA No Retinopathy 03/26/2021 12:00 AM  ?  ?Last diabetic Foot exam:  ?Lab Results  ?Component Value Date/Time  ? HMDIABFOOTEX normal 06/13/2014 12:00 AM  ?  ? ?Lab Results  ?Component Value Date  ? CHOL 114 04/25/2021  ? HDL 44.70 04/25/2021  ? LDLCALC 51 04/25/2021  ? TRIG 92.0 04/25/2021  ? CHOLHDL 3 04/25/2021  ? ? ? ?  Latest Ref Rng & Units 04/25/2021  ? 10:19 AM 10/11/2019  ? 12:47 PM 10/15/2018  ? 10:38 AM  ?Hepatic Function  ?Total Protein 6.0 - 8.3 g/dL 6.3   6.6   6.2    ?Albumin 3.5 - 5.2 g/dL 4.1   4.2   4.0    ?AST 0 - 37 U/L 16   21   20     ?ALT 0 - 35 U/L 12   23   18     ?Alk Phosphatase 39 - 117 U/L 65   68   73    ?Total Bilirubin 0.2 - 1.2 mg/dL 1.0   1.2   0.9    ? ? ?Lab Results  ?Component Value Date/Time  ? TSH 4.22 02/08/2021 02:17 PM  ? TSH 0.44 10/09/2020 01:44 PM  ? FREET4 0.79 02/08/2021 02:17 PM  ? FREET4 0.83 10/09/2020 01:44 PM  ? ? ? ?  Latest Ref Rng & Units 04/25/2021  ? 10:19 AM 10/11/2019  ? 12:47 PM 10/15/2018  ? 10:38 AM  ?CBC  ?WBC 4.0 - 10.5 K/uL 7.4   6.8   7.3    ?Hemoglobin 12.0 - 15.0 g/dL 13.5   15.0   13.9    ?Hematocrit 36.0 - 46.0 % 40.9   43.7   41.5    ?Platelets 150.0 - 400.0 K/uL 144.0   125.0   138.0    ? ? ?Lab Results  ?Component Value Date/Time  ? VD25OH 23.73 (L) 06/13/2014 11:28 AM  ? ? ?Clinical ASCVD: No  ?The ASCVD Risk score (Arnett DK, et al., 2019) failed to calculate for the following reasons: ?  The 2019 ASCVD risk score is only valid for ages 67 to 35   ? ? ?  04/25/2021  ?  9:22 AM 04/16/2021  ?  2:51 PM 11/01/2019  ?  9:20 AM  ?Depression screen PHQ 2/9  ?Decreased Interest 0 0 0  ?Down, Depressed, Hopeless 0 1 0  ?PHQ - 2 Score 0 1 0  ?Altered sleeping   3  ?Tired, decreased energy   3  ?Change in appetite   0  ?Feeling bad or failure about yourself    0  ?Trouble concentrating   0  ?Moving slowly or  fidgety/restless   0  ?Suicidal thoughts   0  ?PHQ-9 Score   6  ?  ? ? ?Social History  ? ?Tobacco Use  ?Smoking Status Never  ?Smokeless Tobacco Never  ? ?BP Readings from Last 3 Encounters:  ?04/25/21 132/76  ?04/09/21 139/90  ?02/08/21 (!) 146/90  ? ?Pulse Readings from Last 3 Encounters:  ?04/25/21 71  ?04/09/21 70  ?02/08/21 76  ? ?Wt Readings from Last  3 Encounters:  ?04/25/21 246 lb 12.8 oz (111.9 kg)  ?04/16/21 247 lb (112 kg)  ?04/09/21 247 lb (112 kg)  ? ?BMI Readings from Last 3 Encounters:  ?04/25/21 38.65 kg/m?  ?04/16/21 38.69 kg/m?  ?04/09/21 38.69 kg/m?  ? ? ?Assessment/Interventions: Review of patient past medical history, allergies, medications, health status, including review of consultants reports, laboratory and other test data, was performed as part of comprehensive evaluation and provision of chronic care management services.  ? ?SDOH:  (Social Determinants of Health) assessments and interventions performed: No - done March 2023 at [facility] ? ?SDOH Screenings  ? ?Alcohol Screen: Low Risk   ? Last Alcohol Screening Score (AUDIT): 0  ?Depression (PHQ2-9): Low Risk   ? PHQ-2 Score: 0  ?Financial Resource Strain: Low Risk   ? Difficulty of Paying Living Expenses: Not hard at all  ?Food Insecurity: No Food Insecurity  ? Worried About Charity fundraiser in the Last Year: Never true  ? Ran Out of Food in the Last Year: Never true  ?Housing: Not on file  ?Physical Activity: Inactive  ? Days of Exercise per Week: 0 days  ? Minutes of Exercise per Session: 0 min  ?Social Connections: Socially Integrated  ? Frequency of Communication with Friends and Family: More than three times a week  ? Frequency of Social Gatherings with Friends and Family: More than three times a week  ? Attends Religious Services: More than 4 times per year  ? Active Member of Clubs or Organizations: Yes  ? Attends Archivist Meetings: More than 4 times per year  ? Marital Status: Married  ?Stress: No Stress Concern Present   ? Feeling of Stress : Only a little  ?Tobacco Use: Low Risk   ? Smoking Tobacco Use: Never  ? Smokeless Tobacco Use: Never  ? Passive Exposure: Not on file  ?Transportation Needs: No Transportation Needs  ? Lack of Transport

## 2021-05-17 NOTE — Patient Instructions (Signed)
Visit Information ? ?Phone number for Pharmacist: [phone] ? ? Goals Addressed   ?None ?  ? ? ?Care Plan : Pharmacy Care Plan  ?Updates made by Kathyrn Sheriff, RPH since 05/17/2021 12:00 AM  ?  ? ?Problem: Hypertension, Diabetes, Chronic Kidney Disease, and Depression   ?  ? ?Long-Range Goal: Disease mgmt   ?Start Date: 05/17/2021  ?This Visit's Progress: On track  ?Priority: High  ?Note:   ?Current Barriers:  ?Occasional missed doses of Januvia ? ?Pharmacist Clinical Goal(s):  ?Patient will contact provider office for questions/concerns as evidenced notation of same in electronic health record through collaboration with PharmD and provider.  ? ?Interventions: ?1:1 collaboration with Doreene Nest, NP regarding development and update of comprehensive plan of care as evidenced by provider attestation and co-signature ?Inter-disciplinary care team collaboration (see longitudinal plan of care) ?Comprehensive medication review performed; medication list updated in electronic medical record ? ?Hypertension (BP goal <140/90) ?-Controlled - BP at goal in clinic ?-Current treatment: ?Lisinopril 10 mg daily AM - Appropriate, Effective, Safe, Accessible ?-Medications previously tried: HCTZ  ?-Educated on BP goals and benefits of medications for prevention of heart attack, stroke and kidney damage; ?-Counseled to monitor BP at home periodically ?-Recommended to continue current medication ? ?Diabetes (A1c goal <8%) ?-Not ideally controlled - A1c 8.2% (04/2021), increased from 7.0% last year, mainly due to inactivity 2/2 knee pain lately; pt reports she occasionally forgets Januvia because she takes this after lunch due to general concern for taking too many meds at once - discussed their is no need to separate meds like this, she can take them all at once in AM; she reports cost of Januvia is not an issue ?-No statin due to LDL 51, age 8.  ?-Current home glucose readings: checking once a week ?fasting glucose: avg  150 (per pt); up to 170s ?-Denies hypoglycemic/hyperglycemic symptoms ?-Current medications: ?Metformin ER 500 mg - 2 tab BID - Appropriate, Effective, Safe, Accessible ?Glipizide XL 10 mg daily - Appropriate, Effective, Safe, Accessible ?Januvia 100 mg daily - Appropriate, Effective, Safe, Accessible ?-Medications previously tried: n/a  ?-Current meal patterns:  ? Breakfast: cereal (generic oats w/ almonds); blueberries ? Lunch: [place] sandwich, tomato ? Dinner; chicken, brussel sprouts, sweet potato ?-Current exercise: limited due to knee pain x 10 years; she does seated exercises, wall push ups, arm raises occasionally ?-Educated on A1c and blood sugar goals;Complications of diabetes including kidney damage, retinal damage, and cardiovascular disease; ?-Discussed diet at length - plate method, "white foods" that are high in carbs, try to maximize protein/vegetables and limit carbs ?-Discussed potential med changes in future - pt has PCP f/u July 2023, will recheck A1c, if it is still > 8% would recommend to change Januvia to Rybelsus (PAP available) ?-Recommended to continue current medication - can take all meds in AM ? ?Depression/Anxiety (Goal: manage symptoms) ?-Controlled - pt has weaned off of fluoxetine this month, she denies any issues or mood fluctuations  ?-PHQ9: 0 (04/2021) ?-Connected with PCP for mental health support ?-Current treatment: ?None ?-Medications previously tried/failed: fluoxetine  ?-Advised pt to contact PCP if mood issues develop ? 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 TFTs normal now. ? ?Patient Goals/Self-Care Activities ?Patient will:  ?- take medications as prescribed as evidenced by patient report and record review ?focus on medication adherence by routine ?check glucose weekly, document, and provide at future appointments ?  ?  ? ?  Patient verbalizes  understanding of instructions and care plan provided today and agrees to view in MyChart. Active MyChart status confirmed with patient.   ?The patient has been provided with contact information for the care management team and has been advised to call with any health related questions or concerns.  ? ?Al Corpus, PharmD, BCACP ?Clinical Pharmacist ?[facility] ?[phone] ?  ?

## 2021-05-18 DIAGNOSIS — G8929 Other chronic pain: Secondary | ICD-10-CM | POA: Diagnosis not present

## 2021-05-18 DIAGNOSIS — M1711 Unilateral primary osteoarthritis, right knee: Secondary | ICD-10-CM | POA: Diagnosis not present

## 2021-05-18 DIAGNOSIS — M25461 Effusion, right knee: Secondary | ICD-10-CM | POA: Diagnosis not present

## 2021-05-18 DIAGNOSIS — E1165 Type 2 diabetes mellitus with hyperglycemia: Secondary | ICD-10-CM | POA: Diagnosis not present

## 2021-05-18 DIAGNOSIS — M25561 Pain in right knee: Secondary | ICD-10-CM | POA: Diagnosis not present

## 2021-05-20 DIAGNOSIS — F32A Depression, unspecified: Secondary | ICD-10-CM

## 2021-05-20 DIAGNOSIS — I10 Essential (primary) hypertension: Secondary | ICD-10-CM | POA: Diagnosis not present

## 2021-05-20 DIAGNOSIS — Z7984 Long term (current) use of oral hypoglycemic drugs: Secondary | ICD-10-CM | POA: Diagnosis not present

## 2021-05-20 DIAGNOSIS — E1159 Type 2 diabetes mellitus with other circulatory complications: Secondary | ICD-10-CM | POA: Diagnosis not present

## 2021-05-22 ENCOUNTER — Other Ambulatory Visit: Payer: Self-pay | Admitting: Primary Care

## 2021-05-22 DIAGNOSIS — E119 Type 2 diabetes mellitus without complications: Secondary | ICD-10-CM

## 2021-06-04 DIAGNOSIS — G8929 Other chronic pain: Secondary | ICD-10-CM | POA: Diagnosis not present

## 2021-06-04 DIAGNOSIS — M25561 Pain in right knee: Secondary | ICD-10-CM | POA: Diagnosis not present

## 2021-06-08 DIAGNOSIS — H6123 Impacted cerumen, bilateral: Secondary | ICD-10-CM | POA: Diagnosis not present

## 2021-06-08 DIAGNOSIS — H6063 Unspecified chronic otitis externa, bilateral: Secondary | ICD-10-CM | POA: Diagnosis not present

## 2021-06-13 DIAGNOSIS — M25561 Pain in right knee: Secondary | ICD-10-CM | POA: Diagnosis not present

## 2021-06-13 DIAGNOSIS — G8929 Other chronic pain: Secondary | ICD-10-CM | POA: Diagnosis not present

## 2021-06-15 ENCOUNTER — Ambulatory Visit: Payer: Medicare PPO | Admitting: Internal Medicine

## 2021-06-15 ENCOUNTER — Encounter: Payer: Self-pay | Admitting: Internal Medicine

## 2021-06-15 VITALS — BP 130/82 | HR 69 | Ht 67.0 in | Wt 244.0 lb

## 2021-06-15 DIAGNOSIS — E042 Nontoxic multinodular goiter: Secondary | ICD-10-CM | POA: Diagnosis not present

## 2021-06-15 DIAGNOSIS — E0590 Thyrotoxicosis, unspecified without thyrotoxic crisis or storm: Secondary | ICD-10-CM

## 2021-06-15 LAB — T4, FREE: Free T4: 0.78 ng/dL (ref 0.60–1.60)

## 2021-06-15 LAB — T3, FREE: T3, Free: 3.3 pg/mL (ref 2.3–4.2)

## 2021-06-15 LAB — TSH: TSH: 4.85 u[IU]/mL (ref 0.35–5.50)

## 2021-06-15 NOTE — Patient Instructions (Signed)
Please stop at the lab.  If we need to start Levothyroxine, take the thyroid hormone every day, with water, at least 30 minutes before breakfast, separated by at least 4 hours from: - acid reflux medications - calcium - iron - multivitamins  Please return in 6 months, but likely sooner for labs.

## 2021-06-15 NOTE — Progress Notes (Signed)
Patient ID: Courtney HumblesLynda B Vardaman, female   DOB: 21-Mar-1940, 81 y.o.   MRN: [id number]   This visit occurred during the SARS-CoV-2 public health emergency.  Safety protocols were in place, including screening questions prior to the visit, additional usage of staff PPE, and extensive cleaning of exam room while observing appropriate contact time as indicated for disinfecting solutions.   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  Last visit 4 months ago.  Interim history: She had acid reflux at last OV >> resolved.   She is in PT for R knee pain - started last week. She had a steroid inj. 3 weeks ago. She has bilateral leg swelling, started approximately 3 months ago. Otherwise, no complaints today.  Reviewed her TFTs: Lab Results  Component Value Date   TSH 4.22 02/08/2021   TSH 0.44 10/09/2020   TSH 0.19 (L) 12/28/2019   TSH 0.07 (L) 10/11/2019   TSH 0.11 (L) 10/15/2018   TSH 0.09 (L) 08/18/2017   TSH 0.36 02/20/2017   TSH 0.40 11/15/2016   TSH 0.08 (L) 09/12/2016   TSH 0.25 (L) 05/31/2015   FREET4 0.79 02/08/2021   FREET4 0.83 10/09/2020   FREET4 0.67 12/28/2019   FREET4 0.90 08/18/2017   FREET4 0.80 02/20/2017   FREET4 0.76 11/15/2016   FREET4 0.78 12/18/2011   FREET4 0.79 09/26/2011    Lab Results  Component Value Date   T3FREE 3.4 02/08/2021   T3FREE 3.4 10/09/2020   T3FREE 3.1 12/28/2019   T3FREE 3.5 08/18/2017   T3FREE 2.9 02/20/2017   T3FREE 3.3 11/15/2016   Thyroid U/S (01/2010): MNG. Followed for nodules for the last 40 years.   Thyroid uptake and scan (09/16/2017): Small RIGHT thyroid lobe versus LEFT versus cold nodule at inferior pole RIGHT lobe. No additional areas of increased or decreased tracer localization. 6 hour I-123 uptake = 11.8% (normal 5-20%) 24 hour I-123 uptake = 35.0% (normal 10-30%)  IMPRESSION: Mildly elevated 24 hour radio iodine  uptake consistent with hyperthyroidism.  Question asymmetric thyroid lobes smaller on RIGHT versus cold nodule at inferior pole RIGHT lobe; thyroid ultrasound recommended to exclude inferior pole RIGHT lobe mass.  Thyroid Uptake (08/12/2020): 4 hour I-123 uptake = 7.9% (normal 5-20%) 24 hour I-123 uptake = 16.9% (normal 10-30%)   IMPRESSION: Normal I 123 thyroid uptake.  RAI Tx (09/01/2020)  Pt denies: - feeling nodules in neck - hoarseness - dysphagia - choking - SOB with lying down   Pt does not have a FH of thyroid ds: mother. No FH of thyroid cancer. No h/o radiation tx to head or neck. No Biotin use. No recent steroids use.   She also has a history of type 2 diabetes, managed by PCP.  Reviewed HbA1c levels: Lab Results  Component Value Date   HGBA1C 8.2 (A) 04/25/2021   HGBA1C 7.0 (A) 05/09/2020   HGBA1C 7.6 (H) 10/11/2019   HGBA1C 7.9 (A) 07/15/2019   HGBA1C 8.3 (A) 04/14/2019   HGBA1C 8.8 (H) 10/15/2018   HGBA1C 8.1 (A) 03/19/2018   HGBA1C 7.4 (A) 08/18/2017   HGBA1C 7.9 (H) 03/18/2017   HGBA1C 7.9 (H) 12/17/2016   Pt is on a regimen of: - Metformin ER 1000 mg 2x a day, with meals - Glipizide XL 10 mg before breakfast - Januvia 100 mg before breakfast  ROS:  + see HPI Cardiovascular: no CP/no SOB/no palpitations/+ leg swelling Gastrointestinal: no N/no V/no D/no C/+  acid reflux  I reviewed pt's medications, allergies, PMH, social hx, family hx, and changes were documented in the history of present illness. Otherwise, unchanged from my initial visit note.  Past Medical History:  Diagnosis Date   Anxiety and depression    Diabetes mellitus    HTN (hypertension)    120/80's at home   Thyroid disorder    Past Surgical History:  Procedure Laterality Date   ABDOMINAL HYSTERECTOMY  1976   complete   BREAST BIOPSY Right    neg   BREAST SURGERY  1968   benign   CATARACT EXTRACTION, BILATERAL  2016   [facility]    NECK SURGERY  1993    ?bone spur removal   URETHRA SURGERY     WRIST FRACTURE SURGERY     Social History   Social History Main Topics   Smoking status: Never Smoker   Smokeless tobacco: Never Used   Alcohol use No   Drug use: No   Social History Narrative   Married.    2 children, 2 grand children.   Lives in [place].   Retired, once worked as a Runner, broadcasting/film/video.    Enjoys spending time with family, reading.    Current Outpatient Medications on File Prior to Visit  Medication Sig Dispense Refill   Fluocinolone Acetonide 0.01 % OIL      fluticasone (FLONASE) 50 MCG/ACT nasal spray Place 1 spray into both nostrils 2 (two) times daily. 48 g 0   glipiZIDE (GLUCOTROL XL) 10 MG 24 hr tablet TAKE 1 TABLET BY MOUTH DAILY WITH BREAKFAST FOR DIABETES 90 tablet 1   lisinopril (ZESTRIL) 10 MG tablet TAKE 1 TABLET BY MOUTH DAILY FOR BLOOD PRESSURE 90 tablet 3   metFORMIN (GLUCOPHAGE-XR) 500 MG 24 hr tablet Take 2 tablets (1,000 mg total) by mouth 2 (two) times daily with a meal. For diabetes. Office visit required for further refills. 360 tablet 0   sitaGLIPtin (JANUVIA) 100 MG tablet Take 1 tablet (100 mg total) by mouth daily. for diabetes. 90 tablet 1   No current facility-administered medications on file prior to visit.   Allergies  Allergen Reactions   Adhesive [Tape]    Sulfa Antibiotics     Headache   Latex Rash   Family History  Problem Relation Age of Onset   Cancer Mother        lung   Cancer Father    COPD Father    Diabetes Sister    Hypertension Sister    Breast cancer Paternal Grandmother 71   PE: BP 130/82 (BP Location: Right Arm, Patient Position: Sitting, Cuff Size: Normal)   Pulse 69   Ht 5\' 7"  (1.702 m)   Wt 244 lb (110.7 kg)   SpO2 96%   BMI 38.22 kg/m  Wt Readings from Last 3 Encounters:  06/15/21 244 lb (110.7 kg)  04/25/21 246 lb 12.8 oz (111.9 kg)  04/16/21 247 lb (112 kg)   Constitutional: overweight, in NAD Eyes: PERRLA, EOMI, no exophthalmos ENT: moist mucous  membranes, no thyromegaly, no cervical lymphadenopathy Cardiovascular: RRR, No MRG, + B LE edema Respiratory: CTA B Musculoskeletal: no deformities, strength intact in all 4 Skin: moist, warm, no rashes Neurological: no tremor with outstretched hands, DTR normal in all 4  ASSESSMENT: 1. Thyrotoxicosis  2. MNG  PLAN:  1. Patient with history of a low TSH at least since 2017, with few thyrotoxic symptoms including heat intolerance, hyperdefecation, anxiety, but without tremors, palpitations, weight loss.  She had  a history of a multinodular goiter on ultrasound.  She had negative Graves' antibodies.  She did have a thyroid uptake and scan in 08/2017 that showed a slightly elevated 24-hour uptake and a small cold nodule. -We proceeded with RAI treatment, which she had on 09/01/2020 -She continues to feel well after treatment, without hypo or hyperthyroid symptoms.  She does have bilateral leg swelling, worse in the last 3 months.  Discussed about moving around more, getting up every hour. -At last visit, her TFTs were normal and we did not have to start levothyroxine; however, her TSH was at the upper limit of normal -We will repeat her TFTs today -We discussed about how to take levothyroxine correctly, in case we need to start:  every day, with water, at least 30 minutes before breakfast, separated by at least 4 hours from: - acid reflux medications - calcium - iron - multivitamins -I will have her return in 6 months  2. MNG  -Patient with long history of thyroid nodules, for more than 40 years, followed closely by ENT, with stability of the nodules over time -No neck compression symptoms - one of her thyroid nodules remains palpable in the anterior neck, but this is stable -We will continue to follow her clinically, without intervention  Component     Latest Ref Rng 06/15/2021  TSH     0.35 - 5.50 uIU/mL 4.85   T4,Free(Direct)     0.60 - 1.60 ng/dL 1.54    Triiodothyronine,Free,Serum     2.3 - 4.2 pg/mL 3.3    Thyroid tests remain normal.  No need to start levothyroxine.  Carlus Pavlov, MD PhD [facility] Endocr[facility]

## 2021-06-20 DIAGNOSIS — M25561 Pain in right knee: Secondary | ICD-10-CM | POA: Diagnosis not present

## 2021-06-20 DIAGNOSIS — G8929 Other chronic pain: Secondary | ICD-10-CM | POA: Diagnosis not present

## 2021-06-27 DIAGNOSIS — G8929 Other chronic pain: Secondary | ICD-10-CM | POA: Diagnosis not present

## 2021-06-27 DIAGNOSIS — M25561 Pain in right knee: Secondary | ICD-10-CM | POA: Diagnosis not present

## 2021-07-04 DIAGNOSIS — G8929 Other chronic pain: Secondary | ICD-10-CM | POA: Diagnosis not present

## 2021-07-04 DIAGNOSIS — M25561 Pain in right knee: Secondary | ICD-10-CM | POA: Diagnosis not present

## 2021-07-13 DIAGNOSIS — M25561 Pain in right knee: Secondary | ICD-10-CM | POA: Diagnosis not present

## 2021-07-13 DIAGNOSIS — G8929 Other chronic pain: Secondary | ICD-10-CM | POA: Diagnosis not present

## 2021-07-25 ENCOUNTER — Ambulatory Visit: Payer: Medicare PPO | Admitting: Primary Care

## 2021-07-25 ENCOUNTER — Encounter: Payer: Self-pay | Admitting: Primary Care

## 2021-07-25 VITALS — BP 134/78 | HR 62 | Temp 98.6°F | Ht 67.0 in | Wt 240.0 lb

## 2021-07-25 DIAGNOSIS — E1165 Type 2 diabetes mellitus with hyperglycemia: Secondary | ICD-10-CM | POA: Diagnosis not present

## 2021-07-25 LAB — POCT GLYCOSYLATED HEMOGLOBIN (HGB A1C): Hemoglobin A1C: 7.6 % — AB (ref 4.0–5.6)

## 2021-07-25 NOTE — Patient Instructions (Signed)
Keep working on your diet!  Please schedule a follow up visit for 6 months for a diabetes check.  It was a pleasure to see you today!

## 2021-07-25 NOTE — Progress Notes (Signed)
Subjective:    Patient ID: Courtney Stone, female    DOB: 18-Aug-1940, 81 y.o.   MRN: [id number]  Diabetes Pertinent negatives for hypoglycemia include no dizziness or headaches. Pertinent negatives for diabetes include no chest pain.    Courtney Stone is a very pleasant 81 y.o. female with a history of hypertension, type 2 diabetes, obesity, chronic fatigue who presents today for follow-up of diabetes.  Current medications include: Januvia 100 mg daily, glipizide XL 10 mg daily, metformin XR 1000 mg twice daily  She is checking her blood glucose 1 time weekly and is getting readings in the mid 100's on average.   Last A1C: 8.2 in April 2023, 7.6 today Last Eye Exam: Up-to-date Last Foot Exam: Due Pneumonia Vaccination: 2020 Urine Microalbumin: None.  Managed on ACE inhibitor Statin: None.  Dietary changes since last visit: Increased lean protein, cutting back on sweets.    Exercise: She is not exercising because of her chronic knee pain.       Review of Systems  Eyes:  Negative for visual disturbance.  Respiratory:  Negative for shortness of breath.   Cardiovascular:  Negative for chest pain.  Neurological:  Negative for dizziness, numbness and headaches.         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    2  children, 2 grand children.   Lives in [place].   Retired, once worked as a Runner, broadcasting/film/video.    Enjoys spending time with family, reading.    Social Determinants of Health   Financial Resource Strain: Low Risk  (04/16/2021)   Overall Financial Resource Strain (CARDIA)    Difficulty of Paying Living Expenses: Not hard at all  Food Insecurity: No Food Insecurity (04/16/2021)   Hunger Vital Sign    Worried About Running Out of Food in the Last Year: Never true    Ran Out of Food in the Last Year: Never true  Transportation Needs: No Transportation Needs (04/16/2021)   PRAPARE - Administrator, Civil Service (Medical): No    Lack of Transportation (Non-Medical): No  Physical Activity: Inactive (04/16/2021)   Exercise Vital Sign    Days of Exercise per Week: 0 days    Minutes of Exercise per Session: 0 min  Stress: No Stress Concern Present (04/16/2021)   [employer] of Occupational Health - Occupational Stress Questionnaire    Feeling of Stress : Only a little  Social Connections: Socially Integrated (04/16/2021)   Social Connection and Isolation Panel [NHANES]    Frequency of Communication with Friends and Family: More than three times a week    Frequency of Social Gatherings with Friends and Family: More than three  times a week    Attends Religious Services: More than 4 times per year    Active Member of Clubs or Organizations: Yes    Attends Banker Meetings: More than 4 times per year    Marital Status: Married  Catering manager Violence: Not At Risk (04/16/2021)   Humiliation, Afraid, Rape, and Kick questionnaire    Fear of Current or Ex-Partner: No    Emotionally Abused: No    Physically Abused: No    Sexually Abused: No    Past Surgical History:  Procedure Laterality Date   ABDOMINAL HYSTERECTOMY  1976   complete   BREAST BIOPSY Right    neg   BREAST SURGERY  1968   benign   CATARACT EXTRACTION, BILATERAL  2016   [facility]    NECK  SURGERY  1993   ?bone spur removal   URETHRA SURGERY     WRIST FRACTURE SURGERY      Family History  Problem Relation Age of Onset   Cancer Mother        lung   Cancer Father    COPD Father    Diabetes Sister    Hypertension Sister    Breast cancer Paternal Grandmother 45    Allergies  Allergen Reactions   Adhesive [Tape]    Sulfa Antibiotics     Headache   Latex Rash    Current Outpatient Medications on File Prior to Visit  Medication Sig Dispense Refill   Fluocinolone Acetonide 0.01 % OIL      fluticasone (FLONASE) 50 MCG/ACT nasal spray Place 1 spray into both nostrils 2 (two) times daily. 48 g 0   glipiZIDE (GLUCOTROL XL) 10 MG 24 hr tablet TAKE 1 TABLET BY MOUTH DAILY WITH BREAKFAST FOR DIABETES 90 tablet 1   lisinopril (ZESTRIL) 10 MG tablet TAKE 1 TABLET BY MOUTH DAILY FOR BLOOD PRESSURE 90 tablet 3   metFORMIN (GLUCOPHAGE-XR) 500 MG 24 hr tablet Take 2 tablets (1,000 mg total) by mouth 2 (two) times daily with a meal. For diabetes. Office visit required for further refills. 360 tablet 0   sitaGLIPtin (JANUVIA) 100 MG tablet Take 1 tablet (100 mg total) by mouth daily. for diabetes. 90 tablet 1   No current facility-administered medications on file prior to visit.    BP 134/78   Pulse 62   Temp 98.6 F (37 C) (Oral)   Ht 5\' 7"  (1.702 m)   Wt 240 lb (108.9 kg)   SpO2 97%   BMI 37.59 kg/m  Objective:   Physical Exam Cardiovascular:     Rate and Rhythm: Normal rate and regular rhythm.  Pulmonary:     Effort: Pulmonary effort is normal.     Breath sounds: Normal breath sounds.  Musculoskeletal:     Cervical back: Neck supple.  Skin:    General: Skin is warm and dry.           Assessment & Plan:   Problem List Items Addressed This Visit       Endocrine   Type 2 diabetes mellitus with hyperglycemia (HCC) - Primary    Improved with A1C of 7.6 today!  Commended her on dietary changes! Encouraged to continue to work on her diet.   Continue  Januvia 100 mg, Glipizide XL 10 mg daily, Metformin XR 1000 mg BID.  Foot exam today.  Follow up in 6 months.      Relevant Orders   POCT glycosylated hemoglobin (Hb A1C) (Completed)  Pleas Koch, NP

## 2021-07-25 NOTE — Assessment & Plan Note (Signed)
Improved with A1C of 7.6 today!  Commended her on dietary changes! Encouraged to continue to work on her diet.   Continue Januvia 100 mg, Glipizide XL 10 mg daily, Metformin XR 1000 mg BID.  Foot exam today.  Follow up in 6 months.

## 2021-08-21 ENCOUNTER — Other Ambulatory Visit: Payer: Self-pay | Admitting: Primary Care

## 2021-08-21 DIAGNOSIS — E119 Type 2 diabetes mellitus without complications: Secondary | ICD-10-CM

## 2021-10-25 ENCOUNTER — Other Ambulatory Visit: Payer: Self-pay | Admitting: Primary Care

## 2021-10-25 DIAGNOSIS — E119 Type 2 diabetes mellitus without complications: Secondary | ICD-10-CM

## 2021-11-15 ENCOUNTER — Ambulatory Visit (LOCAL_COMMUNITY_HEALTH_CENTER): Payer: Medicare PPO

## 2021-11-15 DIAGNOSIS — Z23 Encounter for immunization: Secondary | ICD-10-CM

## 2021-11-15 DIAGNOSIS — Z719 Counseling, unspecified: Secondary | ICD-10-CM

## 2021-11-15 NOTE — Progress Notes (Signed)
  Are you feeling sick today? No   Have you ever received a dose of COVID-19 Vaccine? [employer], [place], [place], [place], Other) Yes  If yes, which vaccine and how many doses?   PFIZER, 4   Did you bring the vaccination record card or other documentation?  Yes   Do you have a health condition or are undergoing treatment that makes you moderately or severely immunocompromised? This would include, but not be limited to: cancer, HIV, organ transplant, immunosuppressive therapy/high-dose corticosteroids, or moderate/severe primary immunodeficiency.  No  Have you received COVID-19 vaccine before or during hematopoietic cell transplant (HCT) or CAR-T-cell therapies? No  Have you ever had an allergic reaction to: (This would include a severe allergic reaction or a reaction that caused hives, swelling, or respiratory distress, including wheezing.) A component of a COVID-19 vaccine or a previous dose of COVID-19 vaccine? No   Have you ever had an allergic reaction to another vaccine (other thanCOVID-19 vaccine) or an injectable medication? (This would include a severe allergic reaction or a reaction that caused hives, swelling, or respiratory distress, including wheezing.)   No    Do you have a history of any of the following:  Myocarditis or Pericarditis No  Dermal fillers:  No  Multisystem Inflammatory Syndrome (MIS-C or MIS-A)? No  COVID-19 disease within the past 3 months? No  Vaccinated with monkeypox vaccine in the last 4 weeks? No  Eligible and administered Pfizer  comirnaty12y+2023-2024, Fluzone high dose, moniotred, tolerated well. Verbalized understanding of VIS and NCIR copy. M.Hazelgrace Bonham, LPN.

## 2021-11-22 MED ORDER — SITAGLIPTIN PHOSPHATE 100 MG PO TABS
100.0000 mg | ORAL_TABLET | Freq: Every day | ORAL | 1 refills | Status: DC
Start: 2021-11-22 — End: 2022-06-03

## 2021-12-06 DIAGNOSIS — H6121 Impacted cerumen, right ear: Secondary | ICD-10-CM | POA: Diagnosis not present

## 2021-12-06 DIAGNOSIS — H6060 Unspecified chronic otitis externa, unspecified ear: Secondary | ICD-10-CM | POA: Diagnosis not present

## 2021-12-17 ENCOUNTER — Ambulatory Visit: Payer: Medicare PPO | Admitting: Internal Medicine

## 2022-01-18 ENCOUNTER — Telehealth: Payer: Medicare PPO | Admitting: Physician Assistant

## 2022-01-18 DIAGNOSIS — U071 COVID-19: Secondary | ICD-10-CM | POA: Diagnosis not present

## 2022-01-18 DIAGNOSIS — B9689 Other specified bacterial agents as the cause of diseases classified elsewhere: Secondary | ICD-10-CM | POA: Diagnosis not present

## 2022-01-18 DIAGNOSIS — J208 Acute bronchitis due to other specified organisms: Secondary | ICD-10-CM | POA: Diagnosis not present

## 2022-01-18 MED ORDER — AZITHROMYCIN 250 MG PO TABS: ORAL_TABLET | ORAL | 0 refills | Status: AC

## 2022-01-18 MED ORDER — BENZONATATE 100 MG PO CAPS: 100.0000 mg | ORAL_CAPSULE | Freq: Three times a day (TID) | ORAL | 0 refills | Status: DC | PRN

## 2022-01-18 NOTE — Progress Notes (Signed)
Virtual Visit Consent   ZOHAL RENY, you are scheduled for a virtual visit with a [facility] provider today. Just as with appointments in the office, your consent must be obtained to participate. Your consent will be active for this visit and any virtual visit you may have with one of our providers in the next 365 days. If you have a MyChart account, a copy of this consent can be sent to you electronically.  As this is a virtual visit, video technology does not allow for your provider to perform a traditional examination. This may limit your provider's ability to fully assess your condition. If your provider identifies any concerns that need to be evaluated in person or the need to arrange testing (such as labs, EKG, etc.), we will make arrangements to do so. Although advances in technology are sophisticated, we cannot ensure that it will always work on either your end or our end. If the connection with a video visit is poor, the visit may have to be switched to a telephone visit. With either a video or telephone visit, we are not always able to ensure that we have a secure connection.  By engaging in this virtual visit, you consent to the provision of healthcare and authorize for your insurance to be billed (if applicable) for the services provided during this visit. Depending on your insurance coverage, you may receive a charge related to this service.  I need to obtain your verbal consent now. Are you willing to proceed with your visit today? Courtney Stone has provided verbal consent on 01/18/2022 for a virtual visit (video or telephone). Margaretann Loveless, PA-C  Date: 01/18/2022 7:25 PM  Virtual Visit via Video Note   I, Margaretann Loveless, connected with  Courtney Stone  ([id number], [birth year]) on 01/18/22 at  7:30 PM EST by a video-enabled telemedicine application and verified that I am speaking with the correct person using two identifiers.  Location: Patient: Virtual Visit  Location Patient: Home Provider: Virtual Visit Location Provider: Home Office   I discussed the limitations of evaluation and management by telemedicine and the availability of in person appointments. The patient expressed understanding and agreed to proceed.    History of Present Illness: Courtney Stone is a 81 y.o. who identifies as a female who was assigned female at birth, and is being seen today for Covid 93.  HPI: URI  This is a new problem. The current episode started 1 to 4 weeks ago (Covid 19 testing positive on at home test yesterday, symptoms started 01/07/22; husband was hospitalized with Covid 19 on 01/15/22). The problem has been gradually worsening. There has been no fever. Associated symptoms include congestion, coughing, headaches (occasional), rhinorrhea and sinus pain. Pertinent negatives include no diarrhea, ear pain, nausea, plugged ear sensation, sore throat or vomiting. She has tried acetaminophen for the symptoms. The treatment provided no relief.     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   Chronic fatigue 09/13/2014   Medicare annual wellness visit, subsequent 02/05/2013   Thrombocytopenia (HCC) 09/30/2011   Type 2 diabetes mellitus with hyperglycemia (HCC) 07/29/2011  Thyrotoxicosis 07/29/2011   Obesity (BMI 30-39.9) 06/21/2011   Hypertension 12/18/2010   Bilateral knee pain 01/22/2008    Allergies:  Allergies  Allergen Reactions   Adhesive [Tape]    Sulfa Antibiotics     Headache   Latex Rash   Medications:  Current Outpatient Medications:    azithromycin (ZITHROMAX) 250 MG  tablet, Take 2 tablets on day 1, then 1 tablet daily on days 2 through 5, Disp: 6 tablet, Rfl: 0   benzonatate (TESSALON) 100 MG capsule, Take 1 capsule (100 mg total) by mouth 3 (three) times daily as needed., Disp: 30 capsule, Rfl: 0   Fluocinolone Acetonide 0.01 % OIL, , Disp: , Rfl:    fluticasone (FLONASE) 50 MCG/ACT nasal spray, Place 1 spray into both nostrils 2 (two) times daily., Disp: 48 g, Rfl: 0   glipiZIDE (GLUCOTROL XL) 10 MG 24 hr tablet, TAKE 1 TABLET BY MOUTH DAILY WITH BREAKFAST FOR DIABETES, Disp: 90 tablet, Rfl: 0   lisinopril (ZESTRIL) 10 MG tablet, TAKE 1 TABLET BY MOUTH DAILY FOR BLOOD PRESSURE, Disp: 90 tablet, Rfl: 3   metFORMIN (GLUCOPHAGE-XR) 500 MG 24 hr tablet, TAKE 2 TABLETS BY MOUTH TWICE DAILY WITH A MEAL FOR DIABETES, Disp: 360 tablet, Rfl: 1   sitaGLIPtin (JANUVIA) 100 MG tablet, Take 1 tablet (100 mg total) by mouth daily. for diabetes., Disp: 90 tablet, Rfl: 1  Observations/Objective: Patient is well-developed, well-nourished in no acute distress.  Resting comfortably at home.  Head is normocephalic, atraumatic.  No labored breathing.  Speech is clear and coherent with logical content.  Patient is alert and oriented at baseline.    Assessment and Plan: 1. COVID-19  2. Acute bacterial bronchitis - azithromycin (ZITHROMAX) 250 MG tablet; Take 2 tablets on day 1, then 1 tablet daily on days 2 through 5  Dispense: 6 tablet; Refill: 0 - benzonatate (TESSALON) 100 MG capsule; Take 1 capsule (100 mg total) by mouth 3 (three) times daily as needed.  Dispense: 30 capsule; Refill: 0  - Worsening, Tested positive for Covid 19 but symptoms started on 01/07/22 - Will treat with Z-pack and tessalon perles - Can continue Mucinex  - Push fluids.  - Rest.  - Steam and humidifier can help - Seek in person evaluation if worsening or symptoms fail to improve    Follow Up Instructions: I discussed the assessment and treatment plan with the patient. The patient was  provided an opportunity to ask questions and all were answered. The patient agreed with the plan and demonstrated an understanding of the instructions.  A copy of instructions were sent to the patient via MyChart unless otherwise noted below.    The patient was advised to call back or seek an in-person evaluation if the symptoms worsen or if the condition fails to improve as anticipated.  Time:  I spent 10 minutes with the patient via telehealth technology discussing the above problems/concerns.    Margaretann Loveless, PA-C

## 2022-01-18 NOTE — Patient Instructions (Signed)
Courtney Stone, thank you for joining Margaretann Loveless, PA-C for today's virtual visit.  While this provider is not your primary care provider (PCP), if your PCP is located in our provider database this encounter information will be shared with them immediately following your visit.   A [facility] MyChart account gives you access to today's visit and all your visits, tests, and labs performed at [facility] " click here if you don't have a [facility] MyChart account or go to [email or web address]  Consent: (Patient) Courtney Stone provided verbal consent for this virtual visit at the beginning of the encounter.  Current Medications:  Current Outpatient Medications:    azithromycin (ZITHROMAX) 250 MG tablet, Take 2 tablets on day 1, then 1 tablet daily on days 2 through 5, Disp: 6 tablet, Rfl: 0   benzonatate (TESSALON) 100 MG capsule, Take 1 capsule (100 mg total) by mouth 3 (three) times daily as needed., Disp: 30 capsule, Rfl: 0   Fluocinolone Acetonide 0.01 % OIL, , Disp: , Rfl:    fluticasone (FLONASE) 50 MCG/ACT nasal spray, Place 1 spray into both nostrils 2 (two) times daily., Disp: 48 g, Rfl: 0   glipiZIDE (GLUCOTROL XL) 10 MG 24 hr tablet, TAKE 1 TABLET BY MOUTH DAILY WITH BREAKFAST FOR DIABETES, Disp: 90 tablet, Rfl: 0   lisinopril (ZESTRIL) 10 MG tablet, TAKE 1 TABLET BY MOUTH DAILY FOR BLOOD PRESSURE, Disp: 90 tablet, Rfl: 3   metFORMIN (GLUCOPHAGE-XR) 500 MG 24 hr tablet, TAKE 2 TABLETS BY MOUTH TWICE DAILY WITH A MEAL FOR DIABETES, Disp: 360 tablet, Rfl: 1   sitaGLIPtin (JANUVIA) 100 MG tablet, Take 1 tablet (100 mg total) by mouth daily. for diabetes., Disp: 90 tablet, Rfl: 1   Medications ordered in this encounter:  Meds ordered this encounter  Medications   azithromycin (ZITHROMAX) 250 MG tablet    Sig: Take 2 tablets on day 1, then 1 tablet daily on days 2 through 5    Dispense:  6 tablet    Refill:  0    Order Specific Question:   Supervising  Provider    Answer:   Merrilee Jansky [[id number]]   benzonatate (TESSALON) 100 MG capsule    Sig: Take 1 capsule (100 mg total) by mouth 3 (three) times daily as needed.    Dispense:  30 capsule    Refill:  0    Order Specific Question:   Supervising Provider    Answer:   Merrilee Jansky [id number]     *If you need refills on other medications prior to your next appointment, please contact your pharmacy*  Follow-Up: Call back or seek an in-person evaluation if the symptoms worsen or if the condition fails to improve as anticipated.  [facility] [phone]  Other Instructions  Acute Bronchitis, Adult  Acute bronchitis is sudden inflammation of the main airways (bronchi) that come off the windpipe (trachea) in the lungs. The swelling causes the airways to get smaller and make more mucus than normal. This can make it hard to breathe and can cause coughing or noisy breathing (wheezing). Acute bronchitis may last several weeks. The cough may last longer. Allergies, asthma, and exposure to smoke may make the condition worse. What are the causes? This condition can be caused by germs and by substances that irritate the lungs, including: Cold and flu viruses. The most common cause of this condition is the virus that causes the common cold. Bacteria. This is less common. Breathing  in substances that irritate the lungs, including: Smoke from cigarettes and other forms of tobacco. Dust and pollen. Fumes from household cleaning products, gases, or burned fuel. Indoor or outdoor air pollution. What increases the risk? The following factors may make you more likely to develop this condition: A weak body's defense system, also called the immune system. A condition that affects your lungs and breathing, such as asthma. What are the signs or symptoms? Common symptoms of this condition include: Coughing. This may bring up clear, yellow, or green mucus from your lungs  (sputum). Wheezing. Runny or stuffy nose. Having too much mucus in your lungs (chest congestion). Shortness of breath. Aches and pains, including sore throat or chest. How is this diagnosed? This condition is usually diagnosed based on: Your symptoms and medical history. A physical exam. You may also have other tests, including tests to rule out other conditions, such as pneumonia. These tests include: A test of lung function. Test of a mucus sample to look for the presence of bacteria. Tests to check the oxygen level in your blood. Blood tests. Chest X-ray. How is this treated? Most cases of acute bronchitis clear up over time without treatment. Your health care provider may recommend: Drinking more fluids to help thin your mucus so it is easier to cough up. Taking inhaled medicine (inhaler) to improve air flow in and out of your lungs. Using a vaporizer or a humidifier. These are machines that add water to the air to help you breathe better. Taking a medicine that thins mucus and clears congestion (expectorant). Taking a medicine that prevents or stops coughing (cough suppressant). It is not common to take an antibiotic medicine for this condition. Follow these instructions at home:  Take over-the-counter and prescription medicines only as told by your health care provider. Use an inhaler, vaporizer, or humidifier as told by your health care provider. Take two teaspoons (10 mL) of honey at bedtime to lessen coughing at night. Drink enough fluid to keep your urine pale yellow. Do not use any products that contain nicotine or tobacco. These products include cigarettes, chewing tobacco, and vaping devices, such as e-cigarettes. If you need help quitting, ask your health care provider. Get plenty of rest. Return to your normal activities as told by your health care provider. Ask your health care provider what activities are safe for you. Keep all follow-up visits. This is  important. How is this prevented? To lower your risk of getting this condition again: Wash your hands often with soap and water for at least 20 seconds. If soap and water are not available, use hand sanitizer. Avoid contact with people who have cold symptoms. Try not to touch your mouth, nose, or eyes with your hands. Avoid breathing in smoke or chemical fumes. Breathing smoke or chemical fumes will make your condition worse. Get the flu shot every year. Contact a health care provider if: Your symptoms do not improve after 2 weeks. You have trouble coughing up the mucus. Your cough keeps you awake at night. You have a fever. Get help right away if you: Cough up blood. Feel pain in your chest. Have severe shortness of breath. Faint or keep feeling like you are going to faint. Have a severe headache. Have a fever or chills that get worse. These symptoms may represent a serious problem that is an emergency. Do not wait to see if the symptoms will go away. Get medical help right away. Call your local emergency services (911 in the  [place] Do not drive yourself to the hospital. Summary Acute bronchitis is inflammation of the main airways (bronchi) that come off the windpipe (trachea) in the lungs. The swelling causes the airways to get smaller and make more mucus than normal. Drinking more fluids can help thin your mucus so it is easier to cough up. Take over-the-counter and prescription medicines only as told by your health care provider. Do not use any products that contain nicotine or tobacco. These products include cigarettes, chewing tobacco, and vaping devices, such as e-cigarettes. If you need help quitting, ask your health care provider. Contact a health care provider if your symptoms do not improve after 2 weeks. This information is not intended to replace advice given to you by your health care provider. Make sure you discuss any questions you have with your health care  provider. Document Revised: 04/19/2021 Document Reviewed: 05/10/2020 Elsevier Patient Education  2023 Elsevier Inc.    If you have been instructed to have an in-person evaluation today at a local Urgent Care facility, please use the link below. It will take you to a list of all of our available [facility], including address, phone number and hours of operation. Please do not delay care.  [facility]  If you or a family member do not have a primary care provider, use the link below to schedule a visit and establish care. When you choose a [facility] primary care physician or advanced practice provider, you gain a long-term partner in health. Find a Primary Care Provider  Learn more about [facility] in-office and virtual care options: [facility] Get Care Now

## 2022-01-23 ENCOUNTER — Other Ambulatory Visit: Payer: Self-pay | Admitting: Primary Care

## 2022-01-23 DIAGNOSIS — E119 Type 2 diabetes mellitus without complications: Secondary | ICD-10-CM

## 2022-01-29 ENCOUNTER — Ambulatory Visit: Payer: Medicare PPO | Admitting: Primary Care

## 2022-01-29 ENCOUNTER — Encounter: Payer: Self-pay | Admitting: Primary Care

## 2022-01-29 VITALS — BP 136/80 | HR 43 | Temp 97.3°F | Ht 67.0 in | Wt 239.0 lb

## 2022-01-29 DIAGNOSIS — F4323 Adjustment disorder with mixed anxiety and depressed mood: Secondary | ICD-10-CM

## 2022-01-29 DIAGNOSIS — E1165 Type 2 diabetes mellitus with hyperglycemia: Secondary | ICD-10-CM | POA: Diagnosis not present

## 2022-01-29 LAB — POCT GLYCOSYLATED HEMOGLOBIN (HGB A1C): Hemoglobin A1C: 8.6 % — AB (ref 4.0–5.6)

## 2022-01-29 LAB — MICROALBUMIN / CREATININE URINE RATIO
Creatinine,U: 55.2 mg/dL
Microalb Creat Ratio: 1.3 mg/g (ref 0.0–30.0)
Microalb, Ur: <0.7 mg/dL (ref 0.0–1.9)

## 2022-01-29 MED ORDER — FLUOXETINE HCL 10 MG PO TABS: 10.0000 mg | ORAL_TABLET | Freq: Every day | ORAL | 0 refills | Status: DC

## 2022-01-29 NOTE — Progress Notes (Signed)
Subjective:    Patient ID: Courtney Stone, female    DOB: 1940-05-27, 82 y.o.   MRN: [id number]  HPI  Courtney Stone is a very pleasant 82 y.o. female  has a past medical history of Anxiety and depression, Diabetes mellitus, HTN (hypertension), and Thyroid disorder. who presents today for follow up of diabetes, to resume her fluoxetine, and to discuss recent Covid-19 infection.   1) Type 2 Diabetes: Current medications include: metformin XR 1000 mg twice daily, glipizide XL 10 mg daily, Januvia 100 mg daily   She is checking her blood glucose 1 times daily and is getting readings of: 160's.   Last A1C: 7.6 in July 2023, 8.6 today  Last Eye Exam: UTD Last Foot Exam: UTD Pneumonia Vaccination: 2020 Urine Microalbumin: Due and pending. Statin: None.   Dietary changes since last visit: None   Exercise: None  BP Readings from Last 3 Encounters:  01/29/22 136/80  07/25/21 134/78  06/15/21 130/82   2) Anxiety and Depression: Prior history. Previously managed on fluoxetine years ago and did well at the time. She's been off of fluoxetine since April 2023 as she felt better at the time.   Over the last few months her symptoms have returned which include increased stressed, feeling overwhelmed, increased anxiety.     01/29/2022    9:47 AM  GAD 7 : Generalized Anxiety Score  Nervous, Anxious, on Edge 3  Control/stop worrying 3  Worry too much - different things 2  Trouble relaxing 2  Restless 0  Easily annoyed or irritable 3  Afraid - awful might happen 3  Total GAD 7 Score 16  Anxiety Difficulty Somewhat difficult    3) Covid-19 Infection: Positive infection with home test confirming on 01/17/22, symptom onset 01/17/22, husband was diagnosed and hospitalized with Covid-19 infection in mid December 2023.  Evaluated through an [facility] on 12/19/21, provided with azithromycin antibiotic course and Tessalon Perles for worsening Covid-19 infection symptoms.   Today she's feeling  better but remains with residual cough which has improved overall. The [employer] are ineffective.   Review of Systems  Constitutional:  Negative for fever.  Respiratory:  Positive for cough. Negative for shortness of breath.          Past Medical History:  Diagnosis Date   Anxiety and depression    Diabetes mellitus    HTN (hypertension)    120/80's at home   Thyroid disorder     Social History   Socioeconomic History   Marital status: Married    Spouse name: Courtney Stone   Number of children: 2   Years of education: Not on file   Highest education level: Not on file  Occupational History   Occupation: retired    Comment: school Wellsite geologist  Tobacco Use   Smoking status: Never   Smokeless tobacco: Never  Vaping Use   Vaping Use: Never used  Substance and Sexual Activity   Alcohol use: No   Drug use: No   Sexual activity: Never  Other Topics Concern   Not on file  Social History Narrative   Married.    2 children, 2 grand children.   Lives in [place].   Retired, once worked as a Pharmacist, hospital.    Enjoys spending time with family, reading.    Social Determinants of Health   Financial Resource Strain: Low Risk  (04/16/2021)   Overall Financial Resource Strain (CARDIA)    Difficulty of Paying Living Expenses: Not hard at all  Food Insecurity: No Food Insecurity (04/16/2021)   Hunger Vital Sign    Worried About Running Out of Food in the Last Year: Never true    Ran Out of Food in the Last Year: Never true  Transportation Needs: No Transportation Needs (04/16/2021)   PRAPARE - Administrator, Civil Service (Medical): No    Lack of Transportation (Non-Medical): No  Physical Activity: Inactive (04/16/2021)   Exercise Vital Sign    Days of Exercise per Week: 0 days    Minutes of Exercise per Session: 0 min  Stress: No Stress Concern Present (04/16/2021)   [employer] of Occupational Health - Occupational Stress Questionnaire    Feeling of  Stress : Only a little  Social Connections: Socially Integrated (04/16/2021)   Social Connection and Isolation Panel [NHANES]    Frequency of Communication with Friends and Family: More than three times a week    Frequency of Social Gatherings with Friends and Family: More than three times a week    Attends Religious Services: More than 4 times per year    Active Member of [employer] or Organizations: Yes    Attends Engineer, structural: More than 4 times per year    Marital Status: Married  Catering manager Violence: Not At Risk (04/16/2021)   Humiliation, Afraid, Rape, and Kick questionnaire    Fear of Current or Ex-Partner: No    Emotionally Abused: No    Physically Abused: No    Sexually Abused: No    Past Surgical History:  Procedure Laterality Date   ABDOMINAL HYSTERECTOMY  1976   complete   BREAST BIOPSY Right    neg   BREAST SURGERY  1968   benign   CATARACT EXTRACTION, BILATERAL  2016   [facility]    NECK SURGERY  1993   ?bone spur removal   URETHRA SURGERY     WRIST FRACTURE SURGERY      Family History  Problem Relation Age of Onset   Cancer Mother        lung   Cancer Father    COPD Father    Diabetes Sister    Hypertension Sister    Breast cancer Paternal Grandmother 24    Allergies  Allergen Reactions   Adhesive [Tape]    Sulfa Antibiotics     Headache   Latex Rash    Current Outpatient Medications on File Prior to Visit  Medication Sig Dispense Refill   Fluocinolone Acetonide 0.01 % OIL      fluticasone (FLONASE) 50 MCG/ACT nasal spray Place 1 spray into both nostrils 2 (two) times daily. 48 g 0   glipiZIDE (GLUCOTROL XL) 10 MG 24 hr tablet TAKE 1 TABLET BY MOUTH DAILY WITH BREAKFAST FOR DIABETES 90 tablet 0   lisinopril (ZESTRIL) 10 MG tablet TAKE 1 TABLET BY MOUTH DAILY FOR BLOOD PRESSURE 90 tablet 3   metFORMIN (GLUCOPHAGE-XR) 500 MG 24 hr tablet TAKE 2 TABLETS BY MOUTH TWICE DAILY WITH A MEAL FOR DIABETES 360 tablet 1    sitaGLIPtin (JANUVIA) 100 MG tablet Take 1 tablet (100 mg total) by mouth daily. for diabetes. 90 tablet 1   benzonatate (TESSALON) 100 MG capsule Take 1 capsule (100 mg total) by mouth 3 (three) times daily as needed. 30 capsule 0   No current facility-administered medications on file prior to visit.    BP 136/80   Pulse (!) 43   Temp (!) 97.3 F (36.3 C) (Temporal)   Ht 5\' 7"  (  1.702 m)   Wt 239 lb (108.4 kg)   SpO2 98%   BMI 37.43 kg/m  Objective:   Physical Exam Cardiovascular:     Rate and Rhythm: Normal rate and regular rhythm.  Pulmonary:     Effort: Pulmonary effort is normal.     Breath sounds: Normal breath sounds.  Musculoskeletal:     Cervical back: Neck supple.  Skin:    General: Skin is warm and dry.  Psychiatric:        Mood and Affect: Mood normal.           Assessment & Plan:  Type 2 diabetes mellitus with hyperglycemia, without long-term current use of insulin (HCC) Assessment & Plan: Uncontrolled with A1C reading of 8.6.   Discussed with patient about switching from Januvia to GLP 1 agonist weekly; however she is not interested at this time.   Discussed about recent holidays, increased stress levels and COVID could be possibly contributing to this.   Will go ahead and continue current medications. Metformin XR 1000 mg BID, Januvia 100 mg daily and Glipizide XL 10 mg daily.   Discussed with patient about monitoring her diet, limiting carbs and importance of exercise.   Follow up in 3 months.  I evaluated patient, was consulted regarding treatment, and agree with assessment and plan per Modesto Charon, RN, DNP student.   Mayra Reel, NP-C   Orders: -     Microalbumin / creatinine urine ratio -     POCT glycosylated hemoglobin (Hb A1C)  Adjustment disorder with mixed anxiety and depressed mood Assessment & Plan: Uncontrolled with GAD 7 score 16.   Agree with patient to resume Fluoxetine 10 mg daily at this time. Prescription sent.   Will  continue to monitor.   I evaluated patient, was consulted regarding treatment, and agree with assessment and plan per Modesto Charon, RN, DNP student.   Mayra Reel, NP-C   Orders: -     FLUoxetine HCl; Take 1 tablet (10 mg total) by mouth daily. for anxiety and depression.  Dispense: 90 tablet; Refill: 0        Doreene Nest, NP

## 2022-01-29 NOTE — Assessment & Plan Note (Addendum)
Uncontrolled with GAD 7 score 16.   Agree with patient to resume Fluoxetine 10 mg daily at this time. Prescription sent.   Will continue to monitor.   I evaluated patient, was consulted regarding treatment, and agree with assessment and plan per Tinnie Gens, RN, DNP student.   Allie Bossier, NP-C

## 2022-01-29 NOTE — Patient Instructions (Addendum)
Stop by the lab prior to leaving today. I will notify you of your results once received.   It is important that you improve your diet. Please limit carbohydrates in the form of white bread, rice, pasta, sweets, fast food, fried food, sugary drinks, etc. Increase your consumption of fresh fruits and vegetables, whole grains, lean protein.  Ensure you are consuming 64 ounces of water daily.  Continue metformin, glipizide and Januvia.   Prescription for Prozac 10 mg has been sent to your pharmacy. Take one tablet once a day.   You can take Delsym (cough syrup) OTC for cough. Continue Flonase daily.   Please let us know if you would like the referral to the nutritionist.  We will see you back in April for your physical.

## 2022-01-29 NOTE — Assessment & Plan Note (Addendum)
Uncontrolled with A1C reading of 8.6.   Discussed with patient about switching from Januvia to GLP 1 agonist weekly; however she is not interested at this time.   Discussed about recent holidays, increased stress levels and COVID could be possibly contributing to this.   Will go ahead and continue current medications. Metformin XR 1000 mg BID, Januvia 100 mg daily and Glipizide XL 10 mg daily.   Discussed with patient about monitoring her diet, limiting carbs and importance of exercise.   Follow up in 3 months.  I evaluated patient, was consulted regarding treatment, and agree with assessment and plan per Tinnie Gens, RN, DNP student.   Allie Bossier, NP-C

## 2022-01-29 NOTE — Progress Notes (Signed)
Established Patient Office Visit  Subjective   Patient ID: Courtney Stone, female    DOB: 16-Dec-1940  Age: 82 y.o. MRN: [id number]  Chief Complaint  Patient presents with   Follow-up    DM    HPI  Courtney Stone is a 82 year old female with past medical history of hypertension, OSA, type 2 diabetes mellitus, thrombocytopenia, anxiety presents today for a diabetes follow up.    Current medications include: Metformin 500 mg 2 tablets twice a day, Januvia 100 mg daily, Glipizide 10 mg daily.   She is checking her blood glucose once a week, fasting  and is getting readings of 160s. However, states she did have some readings in the 200s during the holidays.   Last A1C: 7.6 in July, 2023. Last Eye Exam:up to date Last Foot Exam: up to date Pneumonia Vaccination:2020 Urine Microalbumin: Due Statin: None. Managed on Ace-inhibitor  Dietary changes since last visit: None. Drinks mostly water. Tries to avoid sweet things. She enjoys eating vegetables, fruits and little bit of meat. States that she does enjoy her carbs.   Exercise: none  She had covid in December. She is still having some post nasal drainage. She was treated with z-pack and tessalon perles; which has given her some relief but she is still has a cough. She is not coughing up any phelgm. She has Flonase at home and takes it once a day.   Anxiety/depression: States her husband has been diagnosed with congestive heart failure, covid and was in the hospital. Her daughter moved in to help with taking care of her husband but she is also sick. She feels that she needs something to help her calm down. She has tried Prozac in the past and tolerated it well. She is open to trying something different. She denies any SI/HI. She does not want to therapy at this time.      01/29/2022    9:47 AM  GAD 7 : Generalized Anxiety Score  Nervous, Anxious, on Edge 3  Control/stop worrying 3  Worry too much - different things 2  Trouble  relaxing 2  Restless 0  Easily annoyed or irritable 3  Afraid - awful might happen 3  Total GAD 7 Score 16  Anxiety Difficulty Somewhat difficult        Review of Systems  Constitutional:  Negative for fever.  Eyes:  Negative for blurred vision.  Respiratory:  Negative for shortness of breath.   Cardiovascular:  Negative for chest pain.  Genitourinary:  Positive for frequency.  Endo/Heme/Allergies:  Negative for polydipsia.  Psychiatric/Behavioral:  Negative for suicidal ideas. The patient is nervous/anxious.       Objective:     BP 136/80   Pulse (!) 43   Temp (!) 97.3 F (36.3 C) (Temporal)   Ht 5\' 7"  (1.702 m)   Wt 239 lb (108.4 kg)   SpO2 98%   BMI 37.43 kg/m  BP Readings from Last 3 Encounters:  01/29/22 136/80  07/25/21 134/78  06/15/21 130/82   Wt Readings from Last 3 Encounters:  01/29/22 239 lb (108.4 kg)  07/25/21 240 lb (108.9 kg)  06/15/21 244 lb (110.7 kg)      Physical Exam Vitals and nursing note reviewed.  Constitutional:      Appearance: Normal appearance.  Cardiovascular:     Rate and Rhythm: Normal rate and regular rhythm.     Pulses: Normal pulses.     Heart sounds: Normal heart sounds.  Pulmonary:  Effort: Pulmonary effort is normal.     Breath sounds: Normal breath sounds.  Neurological:     Mental Status: She is alert and oriented to person, place, and time.  Psychiatric:        Mood and Affect: Mood normal.        Behavior: Behavior normal.      Results for orders placed or performed in visit on 01/29/22  POCT glycosylated hemoglobin (Hb A1C)  Result Value Ref Range   Hemoglobin A1C 8.6 (A) 4.0 - 5.6 %   HbA1c POC (<> result, manual entry)     HbA1c, POC (prediabetic range)     HbA1c, POC (controlled diabetic range)      Last hemoglobin A1c Lab Results  Component Value Date   HGBA1C 8.6 (A) 01/29/2022      The ASCVD Risk score (Arnett DK, et al., 2019) failed to calculate for the following reasons:   The  2019 ASCVD risk score is only valid for ages 72 to 47    Assessment & Plan:   Problem List Items Addressed This Visit       Endocrine   Type 2 diabetes mellitus with hyperglycemia (HCC) - Primary    Uncontrolled with A1C reading of 8.6.   Discussed with patient about switching from Januvia to GLP 1 agonist weekly; however she is not interested at this time.   Discussed about recent holidays, increased stress levels and COVID could be possibly contributing to this.   Will go ahead and continue current medications. Metformin XR 1000 mg BID, Januvia 100 mg daily and Glipizide XL 10 mg daily.   Discussed with patient about monitoring her diet, limiting carbs and importance of exercise.   Follow up in 3 months.      Relevant Orders   Microalbumin/Creatinine Ratio, Urine   POCT glycosylated hemoglobin (Hb A1C) (Completed)     Other   Adjustment disorder with mixed anxiety and depressed mood    Uncontrolled with GAD 7 score 16.   Agree with patient to resume Fluoxetine 10 mg daily at this time. Prescription sent.   Will continue to monitor.       Relevant Medications   FLUoxetine (PROZAC) 10 MG tablet    Return in about 3 months (around 04/30/2022) for physical.    Modesto Charon, BSN-RN, DNP STUDENT

## 2022-02-28 DIAGNOSIS — I10 Essential (primary) hypertension: Secondary | ICD-10-CM | POA: Diagnosis not present

## 2022-02-28 DIAGNOSIS — Z8249 Family history of ischemic heart disease and other diseases of the circulatory system: Secondary | ICD-10-CM | POA: Diagnosis not present

## 2022-02-28 DIAGNOSIS — F324 Major depressive disorder, single episode, in partial remission: Secondary | ICD-10-CM | POA: Diagnosis not present

## 2022-02-28 DIAGNOSIS — G4733 Obstructive sleep apnea (adult) (pediatric): Secondary | ICD-10-CM | POA: Diagnosis not present

## 2022-02-28 DIAGNOSIS — Z7984 Long term (current) use of oral hypoglycemic drugs: Secondary | ICD-10-CM | POA: Diagnosis not present

## 2022-02-28 DIAGNOSIS — Z833 Family history of diabetes mellitus: Secondary | ICD-10-CM | POA: Diagnosis not present

## 2022-02-28 DIAGNOSIS — Z809 Family history of malignant neoplasm, unspecified: Secondary | ICD-10-CM | POA: Diagnosis not present

## 2022-02-28 DIAGNOSIS — F419 Anxiety disorder, unspecified: Secondary | ICD-10-CM | POA: Diagnosis not present

## 2022-02-28 DIAGNOSIS — E119 Type 2 diabetes mellitus without complications: Secondary | ICD-10-CM | POA: Diagnosis not present

## 2022-03-11 ENCOUNTER — Other Ambulatory Visit: Payer: Self-pay | Admitting: Primary Care

## 2022-03-11 DIAGNOSIS — E119 Type 2 diabetes mellitus without complications: Secondary | ICD-10-CM

## 2022-04-08 ENCOUNTER — Ambulatory Visit (INDEPENDENT_AMBULATORY_CARE_PROVIDER_SITE_OTHER): Payer: Medicare PPO | Admitting: Internal Medicine

## 2022-04-08 VITALS — BP 138/72 | HR 50 | Resp 14 | Ht 67.0 in | Wt 233.0 lb

## 2022-04-08 DIAGNOSIS — G4733 Obstructive sleep apnea (adult) (pediatric): Secondary | ICD-10-CM

## 2022-04-08 DIAGNOSIS — Z7189 Other specified counseling: Secondary | ICD-10-CM | POA: Diagnosis not present

## 2022-04-08 DIAGNOSIS — E119 Type 2 diabetes mellitus without complications: Secondary | ICD-10-CM | POA: Diagnosis not present

## 2022-04-08 DIAGNOSIS — I10 Essential (primary) hypertension: Secondary | ICD-10-CM

## 2022-04-08 DIAGNOSIS — Z961 Presence of intraocular lens: Secondary | ICD-10-CM | POA: Diagnosis not present

## 2022-04-08 DIAGNOSIS — E669 Obesity, unspecified: Secondary | ICD-10-CM | POA: Diagnosis not present

## 2022-04-08 DIAGNOSIS — H43813 Vitreous degeneration, bilateral: Secondary | ICD-10-CM | POA: Diagnosis not present

## 2022-04-08 NOTE — Patient Instructions (Signed)

## 2022-04-08 NOTE — Progress Notes (Signed)
[facility] [place], [place] [address]  Pulmonary Sleep Medicine   Office Visit Note  Patient Name: Courtney Stone LODES DOB: 11-24-1940 MRN [id number]    Chief Complaint: Obstructive Sleep Apnea visit  Brief History:  Yomaira is seen today for an annual follow up on APAP at 5-12 cmh20. The patient has a 7 year history of sleep apnea. Patient is using PAP nightly.  The patient feels rested after sleeping with PAP.  The patient reports benefiting from PAP use. Reported sleepiness is  improved and the Epworth Sleepiness Score is 3 out of 24. The patient does not take naps. The patient complains of the following: No complaints.  The compliance download shows 96% compliance with an average use time of 7 hours 5 minutes. The AHI is 0.8.  The patient does not complain of limb movements disrupting sleep.  ROS  General: (-) fever, (-) chills, (-) night sweat Nose and Sinuses: (-) nasal stuffiness or itchiness, (-) postnasal drip, (-) nosebleeds, (-) sinus trouble. Mouth and Throat: (-) sore throat, (-) hoarseness. Neck: (-) swollen glands, (-) enlarged thyroid, (-) neck pain. Respiratory: - cough, + shortness of breath, - wheezing. Neurologic: - numbness, - tingling. Psychiatric: - anxiety, - depression   Current Medication: Outpatient Encounter Medications as of 04/08/2022  Medication Sig   Fluocinolone Acetonide 0.01 % OIL    FLUoxetine (PROZAC) 10 MG tablet Take 1 tablet (10 mg total) by mouth daily. for anxiety and depression.   fluticasone (FLONASE) 50 MCG/ACT nasal spray Place 1 spray into both nostrils 2 (two) times daily.   glipiZIDE (GLUCOTROL XL) 10 MG 24 hr tablet TAKE 1 TABLET BY MOUTH DAILY WITH BREAKFAST FOR DIABETES   lisinopril (ZESTRIL) 10 MG tablet TAKE 1 TABLET BY MOUTH DAILY FOR BLOOD PRESSURE   metFORMIN (GLUCOPHAGE-XR) 500 MG 24 hr tablet TAKE 2 TABLETS BY MOUTH TWICE DAILY WITH A MEAL FOR DIABETES   sitaGLIPtin (JANUVIA) 100 MG tablet Take 1  tablet (100 mg total) by mouth daily. for diabetes.   [DISCONTINUED] benzonatate (TESSALON) 100 MG capsule Take 1 capsule (100 mg total) by mouth 3 (three) times daily as needed.   No facility-administered encounter medications on file as of 04/08/2022.    Surgical History: Past Surgical History:  Procedure Laterality Date   ABDOMINAL HYSTERECTOMY  1976   complete   BREAST BIOPSY Right    neg   BREAST SURGERY  1968   benign   CATARACT EXTRACTION, BILATERAL  2016   [place]   ?bone spur removal   URETHRA SURGERY     WRIST FRACTURE SURGERY      Medical History: Past Medical History:  Diagnosis Date   Anxiety and depression    Diabetes mellitus    HTN (hypertension)    120/80's at home   Thyroid disorder     Family History: Non contributory to the present illness  Social History: Social History   Socioeconomic History   Marital status: Married    Spouse name: Charles   Number of children: 2   Years of education: Not on file   Highest education level: Not on file  Occupational History   Occupation: retired    Comment: school Wellsite geologist  Tobacco Use   Smoking status: Never   Smokeless tobacco: Never  Vaping Use   Vaping Use: Never used  Substance and Sexual Activity   Alcohol use: No   Drug use: No   Sexual  activity: Never  Other Topics Concern   Not on file  Social History Narrative   Married.    2 children, 2 grand children.   Lives in [place].   Retired, once worked as a Pharmacist, hospital.    Enjoys spending time with family, reading.    Social Determinants of Health   Financial Resource Strain: Low Risk  (04/16/2021)   Overall Financial Resource Strain (CARDIA)    Difficulty of Paying Living Expenses: Not hard at all  Food Insecurity: No Food Insecurity (04/16/2021)   Hunger Vital Sign    Worried About Running Out of Food in the Last Year: Never true    Ran Out of Food in the Last Year: Never true   Transportation Needs: No Transportation Needs (04/16/2021)   PRAPARE - Hydrologist (Medical): No    Lack of Transportation (Non-Medical): No  Physical Activity: Inactive (04/16/2021)   Exercise Vital Sign    Days of Exercise per Week: 0 days    Minutes of Exercise per Session: 0 min  Stress: No Stress Concern Present (04/16/2021)   [place]    Feeling of Stress : Only a little  Social Connections: Socially Integrated (04/16/2021)   Social Connection and Isolation Panel [NHANES]    Frequency of Communication with Friends and Family: More than three times a week    Frequency of Social Gatherings with Friends and Family: More than three times a week    Attends Religious Services: More than 4 times per year    Active Member of [employer] or Organizations: Yes    Attends Music therapist: More than 4 times per year    Marital Status: Married  Human resources officer Violence: Not At Risk (04/16/2021)   Humiliation, Afraid, Rape, and Kick questionnaire    Fear of Current or Ex-Partner: No    Emotionally Abused: No    Physically Abused: No    Sexually Abused: No    Vital Signs: Blood pressure 138/72, pulse (!) 50, resp. rate 14, height 5\' 7"  (1.702 m), weight 233 lb (105.7 kg), SpO2 96 %. Body mass index is 36.49 kg/m.    Examination: General Appearance: The patient is well-developed, well-nourished, and in no distress. Neck Circumference: 39 cm Skin: Gross inspection of skin unremarkable. Head: normocephalic, no gross deformities. Eyes: no gross deformities noted. ENT: ears appear grossly normal Neurologic: Alert and oriented. No involuntary movements.  STOP BANG RISK ASSESSMENT S (snore) Have you been told that you snore?     NO   T (tired) Are you often tired, fatigued, or sleepy during the day?   NO  O (obstruction) Do you stop breathing, choke, or gasp during sleep? NO   P  (pressure) Do you have or are you being treated for high blood pressure? YES   B (BMI) Is your body index greater than 35 kg/m? YES   A (age) Are you 43 years old or older? YES   N (neck) Do you have a neck circumference greater than 16 inches?   NO   G (gender) Are you a female? NO   TOTAL STOP/BANG "YES" ANSWERS 3       A STOP-Bang score of 2 or less is considered low risk, and a score of 5 or more is high risk for having either moderate or severe OSA. For people who score 3 or 4, doctors may need to perform further assessment to determine how likely they  are to have OSA.         EPWORTH SLEEPINESS SCALE:  Scale:  (0)= no chance of dozing; (1)= slight chance of dozing; (2)= moderate chance of dozing; (3)= high chance of dozing  Chance  Situtation    Sitting and reading: 1    Watching TV: 1    Sitting Inactive in public: 0    As a passenger in car: 0      Lying down to rest: 1    Sitting and talking: 0    Sitting quielty after lunch: 0    In a car, stopped in traffic: 0   TOTAL SCORE:   3 out of 24    SLEEP STUDIES:  HST (10/03/14)  REI 15, min SPO2 81%   CPAP COMPLIANCE DATA:  Date Range: 04/02/21 - 04/01/22  Average Daily Use: 7 hours 5 minutes  Median Use: 7 hours 18 minutes  Compliance for > 4 Hours: 350 days  AHI: 0.8 respiratory events per hour  Days Used: 362/365  Mask Leak: 29.9  95th Percentile Pressure: 11.9 cmh20         LABS: Recent Results (from the past 2160 hour(s))  POCT glycosylated hemoglobin (Hb A1C)     Status: Abnormal   Collection Time: 01/29/22  9:33 AM  Result Value Ref Range   Hemoglobin A1C 8.6 (A) 4.0 - 5.6 %   HbA1c POC (<> result, manual entry)     HbA1c, POC (prediabetic range)     HbA1c, POC (controlled diabetic range)    Microalbumin/Creatinine Ratio, Urine     Status: None   Collection Time: 01/29/22 10:14 AM  Result Value Ref Range   Microalb, Ur <0.7 0.0 - 1.9 mg/dL   Creatinine,U 55.2 mg/dL    Microalb Creat Ratio 1.3 0.0 - 30.0 mg/g    Radiology: DG Knee Complete 4 Views Left  Result Date: 04/26/2021 CLINICAL DATA:  Chronic bilateral knee pain. Pain for 10 years, worse over the last month. Progressing chronic bilateral knee pain, right greater than left, decreased quality of life given pain. EXAM: LEFT KNEE - COMPLETE 4+ VIEW COMPARISON:  None. FINDINGS: Mild narrowing of the medial tibiofemoral compartment. Tricompartmental peripheral spurring, moderate in the patellofemoral and medial tibiofemoral compartments. There is a trace knee joint effusion. No fracture, erosion, or bony destruction. Unremarkable soft tissues. 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 Trace knee joint effusion. Electronically Signed   By: Keith Rake M.D.   On: 04/26/2021 11:38   DG Knee Complete 4 Views Right  Result Date: 04/26/2021 CLINICAL DATA:  Chronic bilateral knee pain. Pain for 10 years, worse over the last month. Progressing chronic bilateral knee pain, right greater than left, decreased quality of life given pain. EXAM: RIGHT KNEE - COMPLETE 4+ VIEW COMPARISON:  None. FINDINGS: Mild medial tibiofemoral compartment joint space narrowing. Mild patellofemoral and moderate medial tibiofemoral spurring as well as spurring of the tibial spines. Minimal quadriceps tendon enthesophyte. No fracture, erosion, bony destruction or focal bone lesion. Diminutive knee joint effusion. Unremarkable soft tissues. IMPRESSION: Osteoarthritis, moderate in the medial tibiofemoral compartment and mild in the patellofemoral compartment. Small joint effusion. Electronically Signed   By: Keith Rake M.D.   On: 04/26/2021 11:37    No results found.  No results found.    Assessment and Plan: Patient Active Problem List   Diagnosis Date Noted   OSA on CPAP 11/29/2019   Encounter for annual general medical examination with abnormal findings in  adult 09/16/2017   Multinodular goiter 11/15/2016   Adjustment disorder with mixed anxiety and depressed mood 02/01/2015   Regular astigmatism of left eye 12/27/2014   Regular astigmatism of right eye 12/12/2014   Senile nuclear sclerosis 12/12/2014   Chronic fatigue 09/13/2014   Medicare annual wellness visit, subsequent 02/05/2013   Thrombocytopenia ([place]) 09/30/2011   Type 2 diabetes mellitus with hyperglycemia ([place]) 07/29/2011   Thyrotoxicosis 07/29/2011   Obesity (BMI 30-39.9) 06/21/2011   Hypertension 12/18/2010   Bilateral knee pain 01/22/2008   1. OSA on CPAP The patient does  tolerate PAP and reports  benefit from PAP use. The patient was reminded how to clean equipment and advised to replace supplies routinely. The patient was also counselled on weight loss. The compliance is excellent. The AHI is 0.8.   OSA on cpap- controlled. Continue with excellent compliance with pap. CPAP continues to be medically necessary to treat this patient's OSA. F/u one year.     2. CPAP use counseling CPAP Counseling: had a lengthy discussion with the patient regarding the importance of PAP therapy in management of the sleep apnea. Patient appears to understand the risk factor reduction and also understands the risks associated with untreated sleep apnea. Patient will try to make a good faith effort to remain compliant with therapy. Also instructed the patient on proper cleaning of the device including the water must be changed daily if possible and use of distilled water is preferred. Patient understands that the machine should be regularly cleaned with appropriate recommended cleaning solutions that do not damage the PAP machine for example given white vinegar and water rinses. Other methods such as ozone treatment may not be as good as these simple methods to achieve cleaning.   3. Obesity (BMI 30-39.9) Obesity Counseling: Had a lengthy discussion regarding patients BMI and weight issues. Patient  was instructed on portion control as well as increased activity. Also discussed caloric restrictions with trying to maintain intake less than 2000 Kcal. Discussions were made in accordance with the 5As of weight management. Simple actions such as not eating late and if able to, taking a walk is suggested.   4. Primary hypertension Hypertension Counseling:   The following hypertensive lifestyle modification were recommended and discussed:  1. Limiting alcohol intake to less than 1 oz/day of ethanol:(24 oz of beer or 8 oz of wine or 2 oz of 100-proof whiskey). 2. Take baby ASA 81 mg daily. 3. Importance of regular aerobic exercise and losing weight. 4. Reduce dietary saturated fat and cholesterol intake for overall cardiovascular health. 5. Maintaining adequate dietary potassium, calcium, and magnesium intake. 6. Regular monitoring of the blood pressure. 7. Reduce sodium intake to less than 100 mmol/day (less than 2.3 gm of sodium or less than 6 gm of sodium choride)      General Counseling: I have discussed the findings of the evaluation and examination with Alliana.  I have also discussed any further diagnostic evaluation thatmay be needed or ordered today. Courtney Stone verbalizes understanding of the findings of todays visit. We also reviewed her medications today and discussed drug interactions and side effects including but not limited excessive drowsiness and altered mental states. We also discussed that there is always a risk not just to her but also people around her. she has been encouraged to call the office with any questions or concerns that should arise related to todays visit.  No orders of the defined types were placed in this encounter.  I have personally obtained a history, examined the patient, evaluated laboratory and imaging results, formulated the assessment and plan and placed orders. This patient was seen today by Tressie Ellis, PA-C in collaboration with Dr. Devona Konig.    Allyne Gee, MD [facility] Diplomate ABMS Pulmonary Critical Care Medicine and Sleep Medicine

## 2022-04-17 ENCOUNTER — Telehealth: Payer: Self-pay | Admitting: Primary Care

## 2022-04-17 NOTE — Telephone Encounter (Signed)
Contacted Courtney Stone to schedule their annual wellness visit. Appointment made for 04/18/22.  Courtney Stone AWV direct phone # [phone]  Patient aware of time change *

## 2022-04-18 ENCOUNTER — Encounter: Payer: Self-pay | Admitting: Family Medicine

## 2022-04-18 ENCOUNTER — Ambulatory Visit (INDEPENDENT_AMBULATORY_CARE_PROVIDER_SITE_OTHER): Payer: Medicare PPO | Admitting: Family Medicine

## 2022-04-18 VITALS — Wt 234.0 lb

## 2022-04-18 DIAGNOSIS — Z0000 Encounter for general adult medical examination without abnormal findings: Secondary | ICD-10-CM | POA: Diagnosis not present

## 2022-04-18 NOTE — Progress Notes (Signed)
MEDICARE WELLNESS VISIT PATIENT CHECK-IN and HEALTH RISK ASSESSMENT QUESTIONNAIRE:  -completed by phone/video for upcoming Medicare Preventive Visit  Pre-Visit Check-in: 1)Vitals (height, wt, BP, etc) - record in vitals section for visit on day of visit 2)Review and Update Medications, Allergies PMH, Surgeries, Social history in Epic 3)Hospitalizations in the last year with date/reason? no  4)Review and Update Care Team (patient's specialists) in Epic 5) Complete PHQ9 in Epic  6) Complete Fall Screening in Epic 7)Review all Health Maintenance Due and order under PCP if not done.  8)Medicare Wellness Questionnaire: Answer theses question about your habits: Do you drink alcohol? no How many drinks do you have a day? Have you ever smoked?no Have you stopped smoking and date if applicable?   How many packs a day do you smoke?  Do you use smokeless tobacco?no Do you use illicit drugs?no Do you exercises? No If so, what type and how many days/minutes per week? Are you sexually active? No Number of partners? What did you eat for breakfast today (or yesterday)?cereal with bananas Typical breakfast-same  What did you eat for lunch today (or yesterday)? spaghetti Typical lunch-soup or sandwich What did you eat for diner today (or yesterday)? Typical dinnerChicken with veggies Typical snacks: orange, apple, cheese crackers What beverages do you drink besides water: drinks water, decaf coffee in the morning  Answer theses question about you: Can you perform most household chores?yes Do you find it hard to follow a conversation in a noisy room?yes Do you find it hard to understand a speaker at church or in a meeting?no Do you often ask people to speak up or repeat themselves?no Do you experience ringing in your ears?no Do you have difficulty understanding a soft or whispered voice? yes  Do you feel that you have a problem with memory?yes - mild, does not feel is out of the ordinary Do you  often misplace items?yes Do you balance your checkbook and or bank acounts?yes Do you feel safe at home?yes Last dentist visit? Unsure-has upcoming appointment in June  Do you need assistance with any of the following:  Driving?no  Feeding yourself?no  Getting from bed to chair?no  Getting to the toilet?no  Bathing or showering?no  Dressing yourself?no  Managing money?no  Climbing a flight of stairs?no  Preparing meals?no  Do you have Advanced Directives in place (Living Will, Healthcare Power or Attorney)? yes   Last eye Exam and location?Dr Jeneen Rinks [place]-04/08/2022   Do you currently use prescribed or non-prescribed narcotic or opioid pain medications?no  Do you have a history or close family history of breast, ovarian, tubal or peritoneal cancer or a family member with BRCA 1/2 (breast cancer susceptibility 1 and 2) gene mutations?  no  Nurse/Assistant Credentials/time stamp:J Javares Kaufhold,CMA     ----------------------------------------------------------------------------------------------------------------------------------------------------------------------------------------------------------------------   MEDICARE ANNUAL PREVENTIVE VISIT WITH PROVIDER: (Welcome to [employer], initial annual wellness or annual wellness exam)  Virtual Visit via Phone Note  I connected with Valetta Close on 04/18/22 by phone and verified that I am speaking with the correct person using two identifiers.  Location patient: home Location provider:work or home office Persons participating in the virtual visit: patient, provider  Concerns and/or follow up today: Saw PCP in January. No concerns today.    See HM section in Epic for other details of completed HM.    ROS: negative for report of fevers, unintentional weight loss, vision changes, vision loss, hearing loss or change, chest pain, sob, hemoptysis, melena, hematochezia, hematuria, falls, bleeding or bruising, loc,  thoughts of suicide  or self harm, memory loss  Patient-completed extensive health risk assessment - reviewed and discussed with the patient: See Health Risk Assessment completed with patient prior to the visit either above or in recent phone note. This was reviewed in detailed with the patient today and appropriate recommendations, orders and referrals were placed as needed per Summary below and patient instructions.   Review of Medical History: -PMH, [place], Family History and current specialty and care providers reviewed and updated and listed below   Patient Care Team: Pleas Koch, NP as PCP - General (Internal Medicine) Audie Box, MD as Referring Physician (Ophthalmology) Charlton Haws, [facility] as Pharmacist (Pharmacist)   Past Medical History:  Diagnosis Date   Anxiety and depression    Diabetes mellitus    HTN (hypertension)    120/80's at home   Thyroid disorder     Past Surgical History:  Procedure Laterality Date   ABDOMINAL HYSTERECTOMY  1976   complete   BREAST BIOPSY Right    neg   BREAST SURGERY  1968   benign   CATARACT EXTRACTION, BILATERAL  2016   [place]   ?bone spur removal   URETHRA SURGERY     WRIST FRACTURE SURGERY      Social History   Socioeconomic History   Marital status: Married    Spouse name: Charles   Number of children: 2   Years of education: Not on file   Highest education level: Not on file  Occupational History   Occupation: retired    Comment: school Wellsite geologist  Tobacco Use   Smoking status: Never   Smokeless tobacco: Never  Vaping Use   Vaping Use: Never used  Substance and Sexual Activity   Alcohol use: No   Drug use: No   Sexual activity: Never  Other Topics Concern   Not on file  Social History Narrative   Married.    2 children, 2 grand children.   Lives in [place].   Retired, once worked as a Pharmacist, hospital.    Enjoys spending time with family, reading.    Social  Determinants of Health   Financial Resource Strain: Low Risk  (04/16/2021)   Overall Financial Resource Strain (CARDIA)    Difficulty of Paying Living Expenses: Not hard at all  Food Insecurity: No Food Insecurity (04/16/2021)   Hunger Vital Sign    Worried About Running Out of Food in the Last Year: Never true    Ran Out of Food in the Last Year: Never true  Transportation Needs: No Transportation Needs (04/16/2021)   PRAPARE - Hydrologist (Medical): No    Lack of Transportation (Non-Medical): No  Physical Activity: Inactive (04/16/2021)   Exercise Vital Sign    Days of Exercise per Week: 0 days    Minutes of Exercise per Session: 0 min  Stress: No Stress Concern Present (04/16/2021)   [place]    Feeling of Stress : Only a little  Social Connections: Socially Integrated (04/16/2021)   Social Connection and Isolation Panel [NHANES]    Frequency of Communication with Friends and Family: More than three times a week    Frequency of Social Gatherings with Friends and Family: More than three times a week    Attends Religious Services: More than 4 times per year    Active Member of Clubs or Organizations: Yes  Attends Archivist Meetings: More than 4 times per year    Marital Status: Married  Human resources officer Violence: Not At Risk (04/16/2021)   Humiliation, Afraid, Rape, and Kick questionnaire    Fear of Current or Ex-Partner: No    Emotionally Abused: No    Physically Abused: No    Sexually Abused: No    Family History  Problem Relation Age of Onset   Cancer Mother        lung   Cancer Father    COPD Father    Diabetes Sister    Hypertension Sister    Breast cancer Paternal Grandmother 49    Current Outpatient Medications on File Prior to Visit  Medication Sig Dispense Refill   Fluocinolone Acetonide 0.01 % OIL      FLUoxetine (PROZAC) 10 MG tablet Take 1 tablet (10 mg  total) by mouth daily. for anxiety and depression. 90 tablet 0   fluticasone (FLONASE) 50 MCG/ACT nasal spray Place 1 spray into both nostrils 2 (two) times daily. 48 g 0   glipiZIDE (GLUCOTROL XL) 10 MG 24 hr tablet TAKE 1 TABLET BY MOUTH DAILY WITH BREAKFAST FOR DIABETES 90 tablet 0   lisinopril (ZESTRIL) 10 MG tablet TAKE 1 TABLET BY MOUTH DAILY FOR BLOOD PRESSURE 90 tablet 3   metFORMIN (GLUCOPHAGE-XR) 500 MG 24 hr tablet TAKE 2 TABLETS BY MOUTH TWICE DAILY WITH A MEAL FOR DIABETES 360 tablet 0   sitaGLIPtin (JANUVIA) 100 MG tablet Take 1 tablet (100 mg total) by mouth daily. for diabetes. 90 tablet 1   No current facility-administered medications on file prior to visit.    Allergies  Allergen Reactions   Adhesive [Tape]    Sulfa Antibiotics     Headache   Latex Rash       Physical Exam There were no vitals filed for this visit. Estimated body mass index is 36.65 kg/m as calculated from the following:   Height as of 04/08/22: 5\' 7"  (1.702 m).   Weight as of this encounter: 234 lb (106.1 kg).  EKG (optional): deferred due to virtual visit  GENERAL: alert, oriented, no acute distress detected, full vision exam deferred due to pandemic and/or virtual encounter  PSYCH/NEURO: pleasant and cooperative, no obvious depression or anxiety, speech and thought processing grossly intact, Cognitive function grossly intact  [place] [facility] Visit from 04/18/2022 in [place] at [facility]  PHQ-9 Total Score 1           04/18/2022    2:17 PM 04/25/2021    9:22 AM 04/16/2021    2:51 PM 11/01/2019    9:20 AM 09/16/2017    1:48 PM  Depression screen PHQ 2/9  Decreased Interest 0 0 0 0 0  Down, Depressed, Hopeless 0 0 1 0 0  PHQ - 2 Score 0 0 1 0 0  Altered sleeping 0   3 0  Tired, decreased energy 1   3 0  Change in appetite 0   0 0  Feeling bad or failure about yourself  0   0 0  Trouble concentrating 0   0 0  Moving slowly or fidgety/restless 0   0 0   Suicidal thoughts 0   0 0  PHQ-9 Score 1   6 0  Difficult doing work/chores     Not difficult at all  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 Denies depression. Reports has good support from her daughters. Declines counseling.      04/16/2021  2:46 PM 04/25/2021    9:22 AM 04/15/2022    6:54 AM 04/17/2022   11:31 AM 04/18/2022    2:17 PM  Fall Risk  Falls in the past year? 0 0 0 0 0  Was there an injury with Fall? 0    0  Fall Risk Category Calculator 0    0  Fall Risk Category (Retired) Low      (RETIRED) Patient Fall Risk Level Low fall risk      Patient at Risk for Falls Due to Orthopedic patient;Impaired balance/gait;Other (Comment)    No Fall Risks  Patient at Risk for Falls Due to - Comments neuropathy      Fall risk Follow up Falls prevention discussed Falls evaluation completed   Falls evaluation completed     SUMMARY AND PLAN:  Encounter for Medicare annual wellness exam   Discussed applicable health maintenance/preventive health measures and advised and referred or ordered per patient preferences:  Health Maintenance  Topic Date Due   Medicare Annual Wellness (AWV)  04/17/2023   Diabetic kidney evaluation - eGFR measurement  04/26/2022   Zoster Vaccines- Shingrix (1 of 2) She is considering and plans to get at pharmacy, agrees to provide copy of record of vaccine to PCP when she does   COVID-19 Vaccine (6 - 2023-24 season) 05/04/2022 (Originally 01/10/2022) - had booster in October, plans to get yearly   FOOT EXAM  07/26/2022   HEMOGLOBIN A1C  07/30/2022   Diabetic kidney evaluation - Urine ACR  01/30/2023   OPHTHALMOLOGY EXAM  04/11/2023   Pneumonia Vaccine 49+ Years old  Completed   INFLUENZA VACCINE  Completed   DEXA SCAN  Completed   HPV VACCINES  Aged Out   DTaP/Tdap/Td  Discontinued    Education and counseling on the following was provided based on the above review of health and a plan/checklist for the patient, along with additional information discussed, was  provided for the patient in the patient instructions :  -Provided counseling and plan for increased risk of falling if applicable per above screening. Safe balance exercises provided. She is using cane and caution.  -offered referral counseling for stress with caring for sick husband, she declined as feels is not needed and reports has good support from her daughters -Advised and counseled on a whole foods based healthy diet and regular exercise. A summary of a healthy diet was provided in the Patient Instructions. She is working on trying to reduce her hgba1c with diet. We discussed ensuring grains are whole grain and how to read labels less sugary fruit options and more.  -discussed exercise guidelines for adults and some chair exercises that she could consider starting - 5 minutes per day to start.   -Advise yearly dental visits at minimum and regular eye exams  Follow up: see patient instructions     Patient Instructions  I really enjoyed getting to talk with you today! I am available on Tuesdays and Thursdays for virtual visits if you have any questions or concerns, or if I can be of any further assistance.   CHECKLIST FROM ANNUAL WELLNESS VISIT:  -Follow up (please call to schedule if not scheduled after visit):   -yearly for annual wellness visit with primary care office  Here is a list of your preventive care/health maintenance measures and the plan for each if any are due:  Health Maintenance  Topic Date Due   Diabetic kidney evaluation - eGFR measurement  04/26/2022   Zoster Vaccines- Shingrix (1  of 2) Can get this at the pharmacy - please bring record to PCP office once you get it so that we can update your chart   COVID-19 Vaccine (6 - 2023-24 season) 05/04/2022 (Originally 01/10/2022)    FOOT EXAM  07/26/2022   HEMOGLOBIN A1C  07/30/2022   Diabetic kidney evaluation - Urine ACR  01/30/2023   OPHTHALMOLOGY EXAM  04/11/2023   Medicare Annual Wellness (AWV)  04/18/2023    Pneumonia Vaccine 49+ Years old  Completed   INFLUENZA VACCINE  Completed   DEXA SCAN  Completed   HPV VACCINES  Aged Out   DTaP/Tdap/Td  Discontinued    -See a dentist at least yearly  -Get your eyes checked and then per your eye specialist's recommendations  -Other issues addressed today:  -I have included below further information regarding a healthy whole foods based diet, physical activity guidelines for adults, stress management and opportunities for social connections. I hope you find this information useful.   -----------------------------------------------------------------------------------------------------------------------------------------------------------------------------------------------------------------------------------------------------------  NUTRITION: -eat real food: lots of colorful vegetables (half the plate) and fruits -5-7 servings of vegetables and fruits per day (fresh or steamed is best), exp. 2 servings of vegetables with lunch and dinner and 2 servings of fruit per day. Berries and greens such as kale and collards are great choices.  -consume on a regular basis: whole grains (make sure first ingredient on label contains the word "whole"), fresh fruits, fish, nuts, seeds, healthy oils (such as olive oil, avocado oil, grape seed oil) -may eat small amounts of dairy and lean meat on occasion, but avoid processed meats such as ham, bacon, lunch meat, etc. -drink water -try to avoid fast food and pre-packaged foods, processed meat -most experts advise limiting sodium to < 2300mg  per day, should limit further is any chronic conditions such as high blood pressure, heart disease, diabetes, etc. The American Heart Association advised that < 1500mg  is is ideal -try to avoid foods that contain any ingredients with names you do not recognize  -try to avoid sugar/sweets (except for the natural sugar that occurs in fresh fruit) -try to avoid sweet drinks -try to avoid  white rice, white bread, pasta (unless whole grain), white or yellow potatoes  EXERCISE GUIDELINES FOR ADULTS: -if you wish to increase your physical activity, do so gradually and with the approval of your doctor -STOP and seek medical care immediately if you have any chest pain, chest discomfort or trouble breathing when starting or increasing exercise  -move and stretch your body, legs, feet and arms when sitting for long periods -Physical activity guidelines for optimal health in adults: -least 150 minutes per week of aerobic exercise (can talk, but not sing) once approved by your doctor, 20-30 minutes of sustained activity or two 10 minute episodes of sustained activity every day.  -resistance training at least 2 days per week if approved by your doctor -balance exercises 3+ days per week:   Stand somewhere where you have something sturdy to hold onto if you lose balance.    1) lift up on toes, start with 5x per day and work up to 20x   2) stand and lift on leg straight out to the side so that foot is a few inches of the floor, start with 5x each side and work up to 20x each side   3) stand on one foot, start with 5 seconds each side and work up to 20 seconds on each side  If you need ideas or help with getting  more active:  -Silver sneakers https://tools.silversneakers.com  -Walk with a Doc: http://stephens-thompson.biz/  -try to include resistance (weight lifting/strength building) and balance exercises twice per week: or the following link for ideas: ChessContest.fr  [email or web address]  STRESS MANAGEMENT: -can try meditating, or just sitting quietly with deep breathing while intentionally relaxing all parts of your body for 5 minutes daily -if you need further help with stress, anxiety or depression please follow up with your primary doctor or contact the wonderful folks at [place]: [place]: -options in [place] if you wish to engage in more social and exercise related activities:  -Silver sneakers https://tools.silversneakers.com  -Walk with a Doc: http://stephens-thompson.biz/  -Check out the [place] 50+ section on the [place] of [employer] (hiking clubs, book clubs, cards and games, chess, exercise classes, aquatic classes and much more) - see the website for details: https://www.[place]-.gov/departments/parks-recreation/active-adults50  -YouTube has lots of exercise videos for different ages and abilities as well  -[place] (a variety of indoor and outdoor inperson activities for adults). (310)359-9986. [address]  -Virtual Online Classes (a variety of topics): see seniorplanet.org or call (917)412-8680  -consider volunteering at a school, hospice center, church, senior center or elsewhere           Lucretia Kern, DO

## 2022-04-18 NOTE — Patient Instructions (Signed)
I really enjoyed getting to talk with you today! I am available on Tuesdays and Thursdays for virtual visits if you have any questions or concerns, or if I can be of any further assistance.   CHECKLIST FROM ANNUAL WELLNESS VISIT:  -Follow up (please call to schedule if not scheduled after visit):   -yearly for annual wellness visit with primary care office  Here is a list of your preventive care/health maintenance measures and the plan for each if any are due:  Health Maintenance  Topic Date Due   Diabetic kidney evaluation - eGFR measurement  04/26/2022   Zoster Vaccines- Shingrix (1 of 2) Can get this at the pharmacy - please bring record to PCP office once you get it so that we can update your chart   COVID-19 Vaccine (6 - 2023-24 season) 05/04/2022 (Originally 01/10/2022)    FOOT EXAM  07/26/2022   HEMOGLOBIN A1C  07/30/2022   Diabetic kidney evaluation - Urine ACR  01/30/2023   OPHTHALMOLOGY EXAM  04/11/2023   Medicare Annual Wellness (AWV)  04/18/2023   Pneumonia Vaccine 69+ Years old  Completed   INFLUENZA VACCINE  Completed   DEXA SCAN  Completed   HPV VACCINES  Aged Out   DTaP/Tdap/Td  Discontinued    -See a dentist at least yearly  -Get your eyes checked and then per your eye specialist's recommendations  -Other issues addressed today:  -I have included below further information regarding a healthy whole foods based diet, physical activity guidelines for adults, stress management and opportunities for social connections. I hope you find this information useful.   -----------------------------------------------------------------------------------------------------------------------------------------------------------------------------------------------------------------------------------------------------------  NUTRITION: -eat real food: lots of colorful vegetables (half the plate) and fruits -5-7 servings of vegetables and fruits per day (fresh or steamed is best),  exp. 2 servings of vegetables with lunch and dinner and 2 servings of fruit per day. Berries and greens such as kale and collards are great choices.  -consume on a regular basis: whole grains (make sure first ingredient on label contains the word "whole"), fresh fruits, fish, nuts, seeds, healthy oils (such as olive oil, avocado oil, grape seed oil) -may eat small amounts of dairy and lean meat on occasion, but avoid processed meats such as ham, bacon, lunch meat, etc. -drink water -try to avoid fast food and pre-packaged foods, processed meat -most experts advise limiting sodium to < 2300mg  per day, should limit further is any chronic conditions such as high blood pressure, heart disease, diabetes, etc. The American Heart Association advised that < 1500mg  is is ideal -try to avoid foods that contain any ingredients with names you do not recognize  -try to avoid sugar/sweets (except for the natural sugar that occurs in fresh fruit) -try to avoid sweet drinks -try to avoid white rice, white bread, pasta (unless whole grain), white or yellow potatoes  EXERCISE GUIDELINES FOR ADULTS: -if you wish to increase your physical activity, do so gradually and with the approval of your doctor -STOP and seek medical care immediately if you have any chest pain, chest discomfort or trouble breathing when starting or increasing exercise  -move and stretch your body, legs, feet and arms when sitting for long periods -Physical activity guidelines for optimal health in adults: -least 150 minutes per week of aerobic exercise (can talk, but not sing) once approved by your doctor, 20-30 minutes of sustained activity or two 10 minute episodes of sustained activity every day.  -resistance training at least 2 days per week if approved by your  doctor -balance exercises 3+ days per week:   Stand somewhere where you have something sturdy to hold onto if you lose balance.    1) lift up on toes, start with 5x per day and work  up to 20x   2) stand and lift on leg straight out to the side so that foot is a few inches of the floor, start with 5x each side and work up to 20x each side   3) stand on one foot, start with 5 seconds each side and work up to 20 seconds on each side  If you need ideas or help with getting more active:  -Silver sneakers https://tools.silversneakers.com  -Walk with a Doc: http://stephens-thompson.biz/  -try to include resistance (weight lifting/strength building) and balance exercises twice per week: or the following link for ideas: ChessContest.fr  UpdateClothing.com.cy  STRESS MANAGEMENT: -can try meditating, or just sitting quietly with deep breathing while intentionally relaxing all parts of your body for 5 minutes daily -if you need further help with stress, anxiety or depression please follow up with your primary doctor or contact the wonderful folks at [place]: [place]: -options in [place] if you wish to engage in more social and exercise related activities:  -Silver sneakers https://tools.silversneakers.com  -Walk with a Doc: http://stephens-thompson.biz/  -Check out the [place] 50+ section on the [place] of [employer] (hiking clubs, book clubs, cards and games, chess, exercise classes, aquatic classes and much more) - see the website for details: https://www.[place].gov/departments/parks-recreation/active-adults50  -YouTube has lots of exercise videos for different ages and abilities as well  -[place] (a variety of indoor and outdoor inperson activities for adults). [phone]. [address].  -Virtual Online Classes (a variety of topics): see seniorplanet.org or call (478)023-5091  -consider volunteering at a school, hospice center, church, senior center or elsewhere

## 2022-04-24 ENCOUNTER — Telehealth: Payer: Self-pay

## 2022-04-24 DIAGNOSIS — F4323 Adjustment disorder with mixed anxiety and depressed mood: Secondary | ICD-10-CM

## 2022-04-24 MED ORDER — FLUOXETINE HCL 10 MG PO TABS: 10.0000 mg | ORAL_TABLET | Freq: Every day | ORAL | 0 refills | Status: DC

## 2022-04-24 NOTE — Telephone Encounter (Signed)
Refills sent to pharmacy. 

## 2022-04-24 NOTE — Addendum Note (Signed)
Addended by: Pleas Koch on: 04/24/2022 05:19 PM   Modules accepted: Orders

## 2022-04-30 ENCOUNTER — Encounter: Payer: Self-pay | Admitting: Primary Care

## 2022-04-30 ENCOUNTER — Ambulatory Visit: Payer: Medicare PPO | Admitting: Primary Care

## 2022-04-30 VITALS — BP 120/76 | HR 65 | Temp 97.9°F | Ht 67.0 in | Wt 232.0 lb

## 2022-04-30 DIAGNOSIS — F4323 Adjustment disorder with mixed anxiety and depressed mood: Secondary | ICD-10-CM | POA: Diagnosis not present

## 2022-04-30 DIAGNOSIS — E042 Nontoxic multinodular goiter: Secondary | ICD-10-CM

## 2022-04-30 DIAGNOSIS — E1165 Type 2 diabetes mellitus with hyperglycemia: Secondary | ICD-10-CM | POA: Diagnosis not present

## 2022-04-30 DIAGNOSIS — G4733 Obstructive sleep apnea (adult) (pediatric): Secondary | ICD-10-CM

## 2022-04-30 DIAGNOSIS — Z0000 Encounter for general adult medical examination without abnormal findings: Secondary | ICD-10-CM | POA: Diagnosis not present

## 2022-04-30 DIAGNOSIS — I10 Essential (primary) hypertension: Secondary | ICD-10-CM | POA: Diagnosis not present

## 2022-04-30 LAB — COMPREHENSIVE METABOLIC PANEL
ALT: 15 U/L (ref 0–35)
AST: 16 U/L (ref 0–37)
Albumin: 4 g/dL (ref 3.5–5.2)
Alkaline Phosphatase: 63 U/L (ref 39–117)
BUN: 21 mg/dL (ref 6–23)
CO2: 23 mEq/L (ref 19–32)
Calcium: 9.5 mg/dL (ref 8.4–10.5)
Chloride: 107 mEq/L (ref 96–112)
Creatinine, Ser: 1.01 mg/dL (ref 0.40–1.20)
GFR: 52.17 mL/min — ABNORMAL LOW (ref >60.00)
Glucose, Bld: 139 mg/dL — ABNORMAL HIGH (ref 70–99)
Potassium: 4.5 mEq/L (ref 3.5–5.1)
Sodium: 139 mEq/L (ref 135–145)
Total Bilirubin: 0.9 mg/dL (ref 0.2–1.2)
Total Protein: 6.1 g/dL (ref 6.0–8.3)

## 2022-04-30 LAB — LIPID PANEL
Cholesterol: 114 mg/dL (ref 0–200)
HDL: 48.9 mg/dL (ref >39.00)
LDL Cholesterol: 50 mg/dL (ref 0–99)
NonHDL: 64.75
Total CHOL/HDL Ratio: 2
Triglycerides: 74 mg/dL (ref 0.0–149.0)
VLDL: 14.8 mg/dL (ref 0.0–40.0)

## 2022-04-30 LAB — POCT GLYCOSYLATED HEMOGLOBIN (HGB A1C): Hemoglobin A1C: 7.1 % — AB (ref 4.0–5.6)

## 2022-04-30 LAB — TSH: TSH: 5.56 u[IU]/mL — ABNORMAL HIGH (ref 0.35–5.50)

## 2022-04-30 NOTE — Progress Notes (Signed)
Subjective:    Patient ID: Courtney Stone, female    DOB: 04/03/1940, 82 y.o.   MRN: [id number]  HPI  Courtney Stone is a very pleasant 82 y.o. female with a history of hypertension, OSA, type 2 diabetes, chronic fatigue, anxiety/depression who presents today for complete physical and follow up of chronic conditions.  Immunizations: -Influenza: Completed this season -Shingles: Completed Shingrix series -Pneumonia: Completed Prevnar 13 in 2015 and pneumovax 23 in 2020  Diet: Fair diet. She has increased consumption of fruits and veggies, lean protein. She has cut back on cookies and breads.  Exercise: No regular exercise.   Eye exam: Completes annually  Dental exam: Completes semi-annually    Mammogram: 2017, declines  Bone Density Scan: 2017, declines   Colonoscopy: Completed in 2013, no further screening completed. Declines given age.  BP Readings from Last 3 Encounters:  04/30/22 120/76  04/08/22 138/72  01/29/22 136/80         Review of Systems  Constitutional:  Negative for unexpected weight change.  HENT:  Negative for rhinorrhea.   Respiratory:  Negative for shortness of breath.   Cardiovascular:  Negative for chest pain.  Gastrointestinal:  Negative for constipation and diarrhea.  Genitourinary:  Negative for difficulty urinating.  Musculoskeletal:  Negative for arthralgias.  Skin:  Negative for rash.  Allergic/Immunologic: Negative for environmental allergies.  Neurological:  Negative for dizziness and headaches.  Psychiatric/Behavioral:  The patient is nervous/anxious.          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    2 children, 2 grand children.   Lives in [place].   Retired, once worked as a Runner, broadcasting/film/video.    Enjoys spending time with family, reading.    Social Determinants of Health   Financial Resource Strain: Low Risk  (04/26/2022)   Overall Financial Resource Strain (CARDIA)    Difficulty of Paying Living Expenses: Not hard at all  Food Insecurity: No Food Insecurity (04/26/2022)   Hunger Vital Sign    Worried About Running Out of Food in the Last Year: Never true    Ran Out of Food in the Last Year: Never true  Transportation Needs: No Transportation Needs (04/26/2022)   PRAPARE - Administrator, Civil Service (Medical): No    Lack of Transportation (Non-Medical): No  Physical Activity: Unknown (04/26/2022)   Exercise Vital Sign    Days of Exercise per Week: 0 days    Minutes of Exercise per Session: Not on file  Stress: Stress Concern Present (04/26/2022)   [employer] of Occupational Health - Occupational Stress Questionnaire    Feeling of Stress : To  some extent  Social Connections: Unknown (04/26/2022)   Social Connection and Isolation Panel [NHANES]    Frequency of Communication with Friends and Family: Twice a week    Frequency of Social Gatherings with Friends and Family: Twice a week    Attends Religious Services: More than 4 times per year    Active Member of [employer] Financial[employer]ubs or Organizations: Not on file    Attends BankerClub or Organization Meetings: Not on file    Marital Status: Married  Intimate Partner Violence: Not At Risk (04/16/2021)   Humiliation, Afraid, Rape, and Kick questionnaire    Fear of Current or Ex-Partner:  No    Emotionally Abused: No    Physically Abused: No    Sexually Abused: No    Past Surgical History:  Procedure Laterality Date   ABDOMINAL HYSTERECTOMY  1976   complete   BREAST BIOPSY Right    neg   BREAST SURGERY  1968   benign   CATARACT EXTRACTION, BILATERAL  2016   [facility]    NECK SURGERY  1993   ?bone spur removal   URETHRA SURGERY     WRIST FRACTURE SURGERY      Family History  Problem Relation Age of Onset   Cancer Mother        lung   Cancer Father    COPD Father    Diabetes Sister    Hypertension Sister    Breast cancer Paternal Grandmother [id number]    Allergies  Allergen Reactions   Adhesive [Tape]    Sulfa Antibiotics     Headache   Latex Rash    Current Outpatient Medications on File Prior to Visit  Medication Sig Dispense Refill   Fluocinolone Acetonide 0.01 % OIL      FLUoxetine (PROZAC) 10 MG tablet Take 1 tablet (10 mg total) by mouth daily. for anxiety and depression. 90 tablet 0   fluticasone (FLONASE) 50 MCG/ACT nasal spray Place 1 spray into both nostrils 2 (two) times daily. 48 g 0   glipiZIDE (GLUCOTROL XL) 10 MG 24 hr tablet TAKE 1 TABLET BY MOUTH DAILY WITH BREAKFAST FOR DIABETES 90 tablet 0   lisinopril (ZESTRIL) 10 MG tablet TAKE 1 TABLET BY MOUTH DAILY FOR BLOOD PRESSURE 90 tablet 3   metFORMIN (GLUCOPHAGE-XR) 500 MG 24 hr tablet TAKE 2 TABLETS BY MOUTH TWICE DAILY WITH A MEAL FOR DIABETES 360 tablet 0   sitaGLIPtin (JANUVIA) 100 MG tablet Take 1 tablet (100 mg total) by mouth daily. for diabetes. 90 tablet 1   No current facility-administered medications on file prior to visit.    BP 120/76   Pulse 65   Temp 97.9 F (36.6 C) (Temporal)   Ht 5\' 7"  (1.702 m)   Wt 232 lb (105.2 kg)   SpO2 98%   BMI 36.34 kg/m  Objective:   Physical Exam HENT:     Right Ear: Tympanic membrane and ear canal normal.     Left Ear: Tympanic membrane and ear canal normal.     Nose: Nose normal.  Eyes:     Conjunctiva/sclera:  Conjunctivae normal.     Pupils: Pupils are equal, round, and reactive to light.  Neck:     Thyroid: No thyromegaly.  Cardiovascular:     Rate and Rhythm: Normal rate and regular rhythm.     Heart sounds: No murmur heard. Pulmonary:     Effort: Pulmonary effort is normal.     Breath sounds: Normal breath sounds. No rales.  Abdominal:  General: Bowel sounds are normal.     Palpations: Abdomen is soft.     Tenderness: There is no abdominal tenderness.  Musculoskeletal:        General: Normal range of motion.     Cervical back: Neck supple.  Lymphadenopathy:     Cervical: No cervical adenopathy.  Skin:    General: Skin is warm and dry.     Findings: No rash.  Neurological:     Mental Status: She is alert and oriented to person, place, and time.     Cranial Nerves: No cranial nerve deficit.     Deep Tendon Reflexes: Reflexes are normal and symmetric.  Psychiatric:        Mood and Affect: Mood normal.           Assessment & Plan:  Preventative health care Assessment & Plan: Immunizations UTD. Mammogram due, she declines given age. Bone density scan due, she declines. Colonoscopy N/A given age  Discussed the importance of a healthy diet and regular exercise in order for weight loss, and to reduce the risk of further co-morbidity.  Exam stable. Labs pending.  Follow up in 1 year for repeat physical.    Type 2 diabetes mellitus with hyperglycemia, without long-term current use of insulin Assessment & Plan: Improved and at goal with A1C today of 7.1!  Continue Glipizide XL 10 mg daily, metformin XR 1000 mg BID, Januvia 100 mg daily. Continue to work on lifestyle changes.   Follow up in 6 months.   Orders: -     POCT glycosylated hemoglobin (Hb A1C)  OSA on CPAP Assessment & Plan: Controlled.  Continue nightly CPAP.   Primary hypertension Assessment & Plan: Controlled.  Continue lisinopril 10 mg daily. CMP pending.  Orders: -     Lipid panel -      Comprehensive metabolic panel  Multinodular goiter Assessment & Plan: No further follow up needed per patient. Repeat TSH pending.  Orders: -     TSH  Adjustment disorder with mixed anxiety and depressed mood Assessment & Plan: Stable per patient.  Continue fluoxetine 10 mg daily.         Doreene Nest, NP

## 2022-04-30 NOTE — Assessment & Plan Note (Signed)
Stable per patient.  Continue fluoxetine 10 mg daily.

## 2022-04-30 NOTE — Patient Instructions (Signed)
Stop by the lab prior to leaving today. I will notify you of your results once received.   It was a pleasure to see you today!  

## 2022-04-30 NOTE — Assessment & Plan Note (Signed)
Immunizations UTD. Mammogram due, she declines given age. Bone density scan due, she declines. Colonoscopy N/A given age  Discussed the importance of a healthy diet and regular exercise in order for weight loss, and to reduce the risk of further co-morbidity.  Exam stable. Labs pending.  Follow up in 1 year for repeat physical.

## 2022-04-30 NOTE — Assessment & Plan Note (Signed)
Controlled.  Continue nightly CPAP.

## 2022-04-30 NOTE — Assessment & Plan Note (Signed)
No further follow up needed per patient. Repeat TSH pending.

## 2022-04-30 NOTE — Assessment & Plan Note (Signed)
Controlled. ? ?Continue lisinopril 10 mg daily. ?CMP pending. ?

## 2022-04-30 NOTE — Assessment & Plan Note (Signed)
Improved and at goal with A1C today of 7.1!  Continue Glipizide XL 10 mg daily, metformin XR 1000 mg BID, Januvia 100 mg daily. Continue to work on lifestyle changes.   Follow up in 6 months.

## 2022-05-01 ENCOUNTER — Other Ambulatory Visit (INDEPENDENT_AMBULATORY_CARE_PROVIDER_SITE_OTHER): Payer: Medicare PPO

## 2022-05-01 ENCOUNTER — Other Ambulatory Visit: Payer: Self-pay | Admitting: Primary Care

## 2022-05-01 DIAGNOSIS — I10 Essential (primary) hypertension: Secondary | ICD-10-CM

## 2022-05-01 DIAGNOSIS — E042 Nontoxic multinodular goiter: Secondary | ICD-10-CM

## 2022-05-01 LAB — T4, FREE: Free T4: 0.75 ng/dL (ref 0.60–1.60)

## 2022-05-01 MED ORDER — AMLODIPINE BESYLATE 5 MG PO TABS
5.0000 mg | ORAL_TABLET | Freq: Every day | ORAL | 0 refills | Status: DC
Start: 2022-05-01 — End: 2022-05-22

## 2022-05-22 ENCOUNTER — Other Ambulatory Visit: Payer: Self-pay | Admitting: Primary Care

## 2022-05-22 ENCOUNTER — Encounter: Payer: Self-pay | Admitting: Primary Care

## 2022-05-22 ENCOUNTER — Ambulatory Visit: Payer: Medicare PPO | Admitting: Primary Care

## 2022-05-22 VITALS — BP 142/80 | HR 71 | Temp 97.1°F | Ht 67.0 in | Wt 232.0 lb

## 2022-05-22 DIAGNOSIS — I10 Essential (primary) hypertension: Secondary | ICD-10-CM | POA: Diagnosis not present

## 2022-05-22 DIAGNOSIS — N1831 Chronic kidney disease, stage 3a: Secondary | ICD-10-CM

## 2022-05-22 DIAGNOSIS — N183 Chronic kidney disease, stage 3 unspecified: Secondary | ICD-10-CM | POA: Insufficient documentation

## 2022-05-22 LAB — BASIC METABOLIC PANEL
BUN: 21 mg/dL (ref 6–23)
CO2: 25 mEq/L (ref 19–32)
Calcium: 9.5 mg/dL (ref 8.4–10.5)
Chloride: 105 mEq/L (ref 96–112)
Creatinine, Ser: 0.97 mg/dL (ref 0.40–1.20)
GFR: 54.74 mL/min — ABNORMAL LOW (ref >60.00)
Glucose, Bld: 145 mg/dL — ABNORMAL HIGH (ref 70–99)
Potassium: 4.3 mEq/L (ref 3.5–5.1)
Sodium: 138 mEq/L (ref 135–145)

## 2022-05-22 MED ORDER — LISINOPRIL 10 MG PO TABS
10.0000 mg | ORAL_TABLET | Freq: Every day | ORAL | 3 refills | Status: DC
Start: 2022-05-22 — End: 2023-08-14

## 2022-05-22 NOTE — Progress Notes (Signed)
Subjective:    Patient ID: Courtney Stone, female    DOB: 05-26-1940, 82 y.o.   MRN: [id number]  HPI  Courtney Stone is a very pleasant 82 y.o. female with a history of hypertension, OSA on CPAP, type 2 diabetes, chronic fatigue who presents today for follow up of hypertension.   She was last evaluated on 04/30/22 for CPE. CMP revealed further decline in renal function so her lisinopril was discontinued and she was switched to amlodipine 5 mg daily. She is here for BP follow up today and repeat BMP.  Since her last visit she is compliant to amlodipine 5 mg daily. She is not checking her BP at home but she does have a BP cuff. She denies dizziness, headaches.   She drinks about 3 bottles of water daily. She avoids NSAID use. She does have chronic ankle edema which is no worse than usual.   BP Readings from Last 3 Encounters:  05/22/22 (!) 142/80  04/30/22 120/76  04/08/22 138/72      Review of Systems  Respiratory:  Negative for shortness of breath.   Cardiovascular:  Negative for chest pain.  Neurological:  Negative for dizziness and headaches.         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    2 children, 2 grand children.   Lives in [place].   Retired, once worked as a  Runner, broadcasting/film/video.    Enjoys spending time with family, reading.    Social Determinants of Health   Financial Resource Strain: Low Risk  (04/26/2022)   Overall Financial Resource Strain (CARDIA)    Difficulty of Paying Living Expenses: Not hard at all  Food Insecurity: No Food Insecurity (04/26/2022)   Hunger Vital Sign    Worried About Running Out of Food in the Last Year: Never true    Ran Out of Food in the Last Year: Never true  Transportation Needs: No Transportation Needs (04/26/2022)   PRAPARE - Administrator, Civil Service (Medical): No    Lack of Transportation (Non-Medical): No  Physical Activity: Unknown (04/26/2022)   Exercise Vital Sign    Days of Exercise per Week: 0 days    Minutes of Exercise per Session: Not on file  Stress: Stress Concern Present (04/26/2022)   [employer] of Occupational Health - Occupational Stress Questionnaire    Feeling of Stress : To some extent  Social Connections: Unknown (04/26/2022)   Social Connection and Isolation Panel [NHANES]    Frequency of Communication with Friends and Family: Twice a week    Frequency of Social Gatherings with Friends and Family: Twice a week  Attends Religious Services: More than 4 times per year    Active Member of Clubs or Organizations: Not on file    Attends Club or Organization Meetings: Not on file    Marital Status: Married  Intimate Partner Violence: Not At Risk (04/16/2021)   Humiliation, Afraid, Rape, and Kick questionnaire    Fear of Current or Ex-Partner: No    Emotionally Abused: No    Physically Abused: No    Sexually Abused: No    Past Surgical History:  Procedure Laterality Date   ABDOMINAL HYSTERECTOMY  1976   complete   BREAST BIOPSY Right    neg   BREAST SURGERY  1968   benign   CATARACT EXTRACTION, BILATERAL  2016   [facility]    NECK SURGERY  1993   ?bone spur removal   URETHRA SURGERY     WRIST FRACTURE SURGERY      Family History  Problem Relation Age of Onset    Cancer Mother        lung   Cancer Father    COPD Father    Diabetes Sister    Hypertension Sister    Breast cancer Paternal Grandmother 40    Allergies  Allergen Reactions   Adhesive [Tape]    Sulfa Antibiotics     Headache   Latex Rash    Current Outpatient Medications on File Prior to Visit  Medication Sig Dispense Refill   amLODipine (NORVASC) 5 MG tablet Take 1 tablet (5 mg total) by mouth daily. for blood pressure. 30 tablet 0   Fluocinolone Acetonide 0.01 % OIL      FLUoxetine (PROZAC) 10 MG tablet Take 1 tablet (10 mg total) by mouth daily. for anxiety and depression. 90 tablet 0   fluticasone (FLONASE) 50 MCG/ACT nasal spray Place 1 spray into both nostrils 2 (two) times daily. 48 g 0   glipiZIDE (GLUCOTROL XL) 10 MG 24 hr tablet TAKE 1 TABLET BY MOUTH DAILY WITH BREAKFAST FOR DIABETES 90 tablet 0   metFORMIN (GLUCOPHAGE-XR) 500 MG 24 hr tablet TAKE 2 TABLETS BY MOUTH TWICE DAILY WITH A MEAL FOR DIABETES 360 tablet 0   sitaGLIPtin (JANUVIA) 100 MG tablet Take 1 tablet (100 mg total) by mouth daily. for diabetes. 90 tablet 1   No current facility-administered medications on file prior to visit.    BP (!) 142/80   Pulse 71   Temp (!) 97.1 F (36.2 C) (Temporal)   Ht 5\' 7"  (1.702 m)   Wt 232 lb (105.2 kg)   SpO2 98%   BMI 36.34 kg/m  Objective:   Physical Exam Cardiovascular:     Rate and Rhythm: Normal rate and regular rhythm.  Pulmonary:     Effort: Pulmonary effort is normal.     Breath sounds: Normal breath sounds.  Musculoskeletal:     Cervical back: Neck supple.  Skin:    General: Skin is warm and dry.           Assessment & Plan:  Primary hypertension Assessment & Plan: Uncontrolled.  Repeat BMP pending today. If no improvement then will resume lisinopril as this historically worked well for BP control. Continue amlodipine 5 mg for now. Consider dose increase to 10 mg daily with close monitoring of ankle edema.   Orders: -     Basic  metabolic panel  Stage 3a chronic kidney disease (HCC) Assessment & Plan: Repeat renal function pending. If no improvement then will resume lisinopril as this was historically effective  for BP control.   Await results.   Orders: -     Basic metabolic panel        Doreene Nest, NP

## 2022-05-22 NOTE — Assessment & Plan Note (Signed)
Uncontrolled.  Repeat BMP pending today. If no improvement then will resume lisinopril as this historically worked well for BP control. Continue amlodipine 5 mg for now. Consider dose increase to 10 mg daily with close monitoring of ankle edema.

## 2022-05-22 NOTE — Assessment & Plan Note (Signed)
Repeat renal function pending. If no improvement then will resume lisinopril as this was historically effective for BP control.   Await results.

## 2022-05-22 NOTE — Patient Instructions (Signed)
Stop by the lab prior to leaving today. I will notify you of your results once received.   It was a pleasure to see you today!  

## 2022-05-23 NOTE — Telephone Encounter (Signed)
Last note you were thinking of increasing her amlodipine to 10 mg

## 2022-06-03 ENCOUNTER — Telehealth: Payer: Self-pay

## 2022-06-03 DIAGNOSIS — E119 Type 2 diabetes mellitus without complications: Secondary | ICD-10-CM

## 2022-06-03 MED ORDER — GLIPIZIDE ER 10 MG PO TB24
ORAL_TABLET | ORAL | 1 refills | Status: DC
Start: 2022-06-03 — End: 2022-11-15

## 2022-06-03 MED ORDER — METFORMIN HCL ER 500 MG PO TB24
ORAL_TABLET | ORAL | 1 refills | Status: DC
Start: 2022-06-03 — End: 2022-11-15

## 2022-06-03 MED ORDER — SITAGLIPTIN PHOSPHATE 100 MG PO TABS
100.0000 mg | ORAL_TABLET | Freq: Every day | ORAL | 1 refills | Status: DC
Start: 2022-06-03 — End: 2022-11-15

## 2022-06-03 NOTE — Addendum Note (Signed)
Addended by: Doreene Nest on: 06/03/2022 08:14 PM   Modules accepted: Orders

## 2022-06-03 NOTE — Telephone Encounter (Signed)
Received refill request for   Metformin ER 500 MG tab Januvia 100 MG tab Glipizide ER 10 MG tab

## 2022-06-06 DIAGNOSIS — J329 Chronic sinusitis, unspecified: Secondary | ICD-10-CM | POA: Diagnosis not present

## 2022-06-06 DIAGNOSIS — H6061 Unspecified chronic otitis externa, right ear: Secondary | ICD-10-CM | POA: Diagnosis not present

## 2022-06-06 DIAGNOSIS — H6121 Impacted cerumen, right ear: Secondary | ICD-10-CM | POA: Diagnosis not present

## 2022-06-10 ENCOUNTER — Other Ambulatory Visit: Payer: Self-pay | Admitting: Primary Care

## 2022-06-10 DIAGNOSIS — I10 Essential (primary) hypertension: Secondary | ICD-10-CM

## 2022-06-21 DIAGNOSIS — G4733 Obstructive sleep apnea (adult) (pediatric): Secondary | ICD-10-CM | POA: Diagnosis not present

## 2022-06-25 ENCOUNTER — Other Ambulatory Visit (INDEPENDENT_AMBULATORY_CARE_PROVIDER_SITE_OTHER): Payer: Medicare PPO

## 2022-06-25 DIAGNOSIS — I10 Essential (primary) hypertension: Secondary | ICD-10-CM | POA: Diagnosis not present

## 2022-06-25 LAB — BASIC METABOLIC PANEL
BUN: 21 mg/dL (ref 6–23)
CO2: 26 mEq/L (ref 19–32)
Calcium: 9.1 mg/dL (ref 8.4–10.5)
Chloride: 107 mEq/L (ref 96–112)
Creatinine, Ser: 1.09 mg/dL (ref 0.40–1.20)
GFR: 47.56 mL/min — ABNORMAL LOW (ref >60.00)
Glucose, Bld: 165 mg/dL — ABNORMAL HIGH (ref 70–99)
Potassium: 4.4 mEq/L (ref 3.5–5.1)
Sodium: 140 mEq/L (ref 135–145)

## 2022-08-06 ENCOUNTER — Other Ambulatory Visit: Payer: Self-pay | Admitting: Primary Care

## 2022-08-06 DIAGNOSIS — F4323 Adjustment disorder with mixed anxiety and depressed mood: Secondary | ICD-10-CM

## 2022-10-30 ENCOUNTER — Ambulatory Visit: Payer: Medicare PPO | Admitting: Primary Care

## 2022-10-30 ENCOUNTER — Encounter: Payer: Self-pay | Admitting: Primary Care

## 2022-10-30 VITALS — BP 124/76 | HR 97 | Temp 97.9°F | Ht 67.0 in | Wt 241.0 lb

## 2022-10-30 DIAGNOSIS — J302 Other seasonal allergic rhinitis: Secondary | ICD-10-CM

## 2022-10-30 DIAGNOSIS — Z23 Encounter for immunization: Secondary | ICD-10-CM

## 2022-10-30 DIAGNOSIS — E1165 Type 2 diabetes mellitus with hyperglycemia: Secondary | ICD-10-CM | POA: Diagnosis not present

## 2022-10-30 DIAGNOSIS — Z7984 Long term (current) use of oral hypoglycemic drugs: Secondary | ICD-10-CM

## 2022-10-30 LAB — POCT GLYCOSYLATED HEMOGLOBIN (HGB A1C): Hemoglobin A1C: 7.7 % — AB (ref 4.0–5.6)

## 2022-10-30 MED ORDER — FLUTICASONE PROPIONATE 50 MCG/ACT NA SUSP: 1.0000 | Freq: Two times a day (BID) | NASAL | 0 refills | Status: DC

## 2022-10-30 NOTE — Patient Instructions (Signed)
Work on [employer] by limiting sweets and processed foods.  Increase your walking every day.  Schedule a physical upfront for 6 months.  It was a pleasure to see you today!

## 2022-10-30 NOTE — Assessment & Plan Note (Signed)
Deteriorated but still overall controlled with A1C of 7.7.  She would like to work on lifestyle changes such as improving her diet. We also discussed to increase physical activity.  Continue glipizide XL 10 mg daily, metformin XR 1000 mg twice daily, sitagliptin 100 mg daily.  Foot exam today.  Follow-up in 6 months.

## 2022-10-30 NOTE — Progress Notes (Signed)
Subjective:    Patient ID: Courtney Stone, female    DOB: 25-Sep-1940, 82 y.o.   MRN: [id number]  HPI  Courtney Stone is a very pleasant 82 y.o. female with a history of hypertension, type 2 diabetes, OSA, CKD who presents today for follow-up of diabetes.  She is also needing a refill of her Flonase.  Current medications include: Glipizide XL 10 mg daily, metformin XR 1000 mg twice daily, Januvia 100 mg daily.  She is checking her blood glucose 1 weekly and is getting readings of 130-200. Mostly in the 130-140s.  Last A1C: 7.1 in April 2024, 7.7 today Last Eye Exam: Up-to-date Last Foot Exam: Due Pneumonia Vaccination: 2020 Urine Microalbumin: Up-to-date Statin: None.  Dietary changes since last visit: None. Eating fresh fruits and veggies. She has increased sweets over the last 2 months.    Exercise: None.   BP Readings from Last 3 Encounters:  10/30/22 124/76  05/22/22 (!) 142/80  04/30/22 120/76       Review of Systems  Respiratory:  Negative for shortness of breath.   Cardiovascular:  Negative for chest pain.  Neurological:  Negative for dizziness and numbness.         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    2 children, 2 grand children.   Lives in [place].    Retired, once worked as a Runner, broadcasting/film/video.    Enjoys spending time with family, reading.    Social Determinants of Health   Financial Resource Strain: Low Risk  (04/26/2022)   Overall Financial Resource Strain (CARDIA)    Difficulty of Paying Living Expenses: Not hard at all  Food Insecurity: No Food Insecurity (04/26/2022)   Hunger Vital Sign    Worried About Running Out of Food in the Last Year: Never true    Ran Out of Food in the Last Year: Never true  Transportation Needs: No Transportation Needs (04/26/2022)   PRAPARE - Administrator, Civil Service (Medical): No    Lack of Transportation (Non-Medical): No  Physical Activity: Unknown (04/26/2022)   Exercise Vital Sign    Days of Exercise per Week: 0 days    Minutes of Exercise per Session: Not on file  Stress: Stress Concern Present (04/26/2022)   [employer] of Occupational Health - Occupational Stress Questionnaire    Feeling of Stress : To some extent  Social Connections: Unknown (04/26/2022)   Social Connection and Isolation Panel [NHANES]    Frequency of Communication with Friends and Family: Twice a week    Frequency of Social Gatherings with Friends and Family: Twice a week    Attends Religious Services:  More than 4 times per year    Active Member of Clubs or Organizations: Not on file    Attends Club or Organization Meetings: Not on file    Marital Status: Married  Intimate Partner Violence: Not At Risk (04/16/2021)   Humiliation, Afraid, Rape, and Kick questionnaire    Fear of Current or Ex-Partner: No    Emotionally Abused: No    Physically Abused: No    Sexually Abused: No    Past Surgical History:  Procedure Laterality Date   ABDOMINAL HYSTERECTOMY  1976   complete   BREAST BIOPSY Right    neg   BREAST SURGERY  1968   benign   CATARACT EXTRACTION, BILATERAL  2016   [facility]    NECK SURGERY  1993   ?bone spur removal   URETHRA SURGERY     WRIST FRACTURE SURGERY      Family History   Problem Relation Age of Onset   Cancer Mother        lung   Cancer Father    COPD Father    Diabetes Sister    Hypertension Sister    Breast cancer Paternal Grandmother 98    Allergies  Allergen Reactions   Adhesive [Tape]    Sulfa Antibiotics     Headache   Latex Rash    Current Outpatient Medications on File Prior to Visit  Medication Sig Dispense Refill   Fluocinolone Acetonide 0.01 % OIL      FLUoxetine (PROZAC) 10 MG tablet TAKE 1 TABLET(10 MG) BY MOUTH DAILY FOR ANXIETY OR DEPRESSION 90 tablet 2   glipiZIDE (GLUCOTROL XL) 10 MG 24 hr tablet TAKE 1 TABLET BY MOUTH DAILY WITH BREAKFAST FOR DIABETES 90 tablet 1   lisinopril (ZESTRIL) 10 MG tablet Take 1 tablet (10 mg total) by mouth daily. for blood pressure. 90 tablet 3   metFORMIN (GLUCOPHAGE-XR) 500 MG 24 hr tablet TAKE 2 TABLETS BY MOUTH TWICE DAILY WITH A MEAL FOR DIABETES 360 tablet 1   sitaGLIPtin (JANUVIA) 100 MG tablet Take 1 tablet (100 mg total) by mouth daily. for diabetes. 90 tablet 1   No current facility-administered medications on file prior to visit.    BP 124/76   Pulse 97   Temp 97.9 F (36.6 C) (Temporal)   Ht 5\' 7"  (1.702 m)   Wt 241 lb (109.3 kg)   SpO2 99%   BMI 37.75 kg/m  Objective:   Physical Exam Cardiovascular:     Rate and Rhythm: Normal rate and regular rhythm.  Pulmonary:     Effort: Pulmonary effort is normal.     Breath sounds: Normal breath sounds.  Musculoskeletal:     Cervical back: Neck supple.  Skin:    General: Skin is warm and dry.  Neurological:     Mental Status: She is alert and oriented to person, place, and time.  Psychiatric:        Mood and Affect: Mood normal.           Assessment & Plan:  Type 2 diabetes mellitus with hyperglycemia, without long-term current use of insulin (HCC) Assessment & Plan: Deteriorated but still overall controlled with A1C of 7.7.  She would like to work on lifestyle changes such as improving her diet. We also discussed to  increase physical activity.  Continue glipizide XL 10 mg daily, metformin XR 1000 mg twice daily, sitagliptin 100 mg daily.  Foot exam today.  Follow-up in 6 months.  Orders: -  POCT glycosylated hemoglobin (Hb A1C)  Seasonal allergies -     Fluticasone Propionate; Place 1 spray into both nostrils 2 (two) times daily.  Dispense: 48 g; Refill: 0        Doreene Nest, NP

## 2022-11-14 DIAGNOSIS — H6063 Unspecified chronic otitis externa, bilateral: Secondary | ICD-10-CM | POA: Diagnosis not present

## 2022-11-14 DIAGNOSIS — H6121 Impacted cerumen, right ear: Secondary | ICD-10-CM | POA: Diagnosis not present

## 2022-11-15 ENCOUNTER — Other Ambulatory Visit: Payer: Self-pay | Admitting: Primary Care

## 2022-11-15 DIAGNOSIS — E119 Type 2 diabetes mellitus without complications: Secondary | ICD-10-CM

## 2022-11-25 DIAGNOSIS — E1165 Type 2 diabetes mellitus with hyperglycemia: Secondary | ICD-10-CM

## 2022-11-25 MED ORDER — GLUCOSE BLOOD VI STRP: ORAL_STRIP | 5 refills | Status: DC

## 2022-12-18 ENCOUNTER — Other Ambulatory Visit: Payer: Self-pay | Admitting: Primary Care

## 2022-12-18 ENCOUNTER — Ambulatory Visit: Payer: Medicare PPO | Admitting: Primary Care

## 2022-12-18 VITALS — BP 124/68 | HR 87 | Temp 97.5°F | Ht 67.0 in | Wt 243.0 lb

## 2022-12-18 DIAGNOSIS — R112 Nausea with vomiting, unspecified: Secondary | ICD-10-CM | POA: Diagnosis not present

## 2022-12-18 DIAGNOSIS — K219 Gastro-esophageal reflux disease without esophagitis: Secondary | ICD-10-CM | POA: Diagnosis not present

## 2022-12-18 DIAGNOSIS — R1084 Generalized abdominal pain: Secondary | ICD-10-CM

## 2022-12-18 DIAGNOSIS — N3001 Acute cystitis with hematuria: Secondary | ICD-10-CM

## 2022-12-18 DIAGNOSIS — K551 Chronic vascular disorders of intestine: Secondary | ICD-10-CM | POA: Diagnosis not present

## 2022-12-18 HISTORY — DX: Nausea with vomiting, unspecified: R11.2

## 2022-12-18 LAB — POC URINALSYSI DIPSTICK (AUTOMATED)
Bilirubin, UA: POSITIVE
Blood, UA: POSITIVE
Glucose, UA: NEGATIVE
Ketones, UA: POSITIVE
Nitrite, UA: POSITIVE
Protein, UA: POSITIVE — AB
Spec Grav, UA: 1.015 (ref 1.010–1.025)
Urobilinogen, UA: 1 U/dL
pH, UA: 5 (ref 5.0–8.0)

## 2022-12-18 LAB — CBC WITH DIFFERENTIAL/PLATELET
Basophils Absolute: 0 10*3/uL (ref 0.0–0.1)
Basophils Relative: 0.3 % (ref 0.0–3.0)
Eosinophils Absolute: 0 10*3/uL (ref 0.0–0.7)
Eosinophils Relative: 0.4 % (ref 0.0–5.0)
HCT: 39.8 % (ref 36.0–46.0)
Hemoglobin: 13.3 g/dL (ref 12.0–15.0)
Lymphocytes Relative: 9.5 % — ABNORMAL LOW (ref 12.0–46.0)
Lymphs Abs: 0.7 10*3/uL (ref 0.7–4.0)
MCHC: 33.5 g/dL (ref 30.0–36.0)
MCV: 93.5 fL (ref 78.0–100.0)
Monocytes Absolute: 0.8 10*3/uL (ref 0.1–1.0)
Monocytes Relative: 10.7 % (ref 3.0–12.0)
Neutro Abs: 6.2 10*3/uL (ref 1.4–7.7)
Neutrophils Relative %: 79.1 % — ABNORMAL HIGH (ref 43.0–77.0)
Platelets: 90 10*3/uL — ABNORMAL LOW (ref 150.0–400.0)
RBC: 4.25 Mil/uL (ref 3.87–5.11)
RDW: 14.4 % (ref 11.5–15.5)
WBC: 7.9 10*3/uL (ref 4.0–10.5)

## 2022-12-18 LAB — COMPREHENSIVE METABOLIC PANEL
ALT: 135 U/L — ABNORMAL HIGH (ref 0–35)
AST: 87 U/L — ABNORMAL HIGH (ref 0–37)
Albumin: 3.8 g/dL (ref 3.5–5.2)
Alkaline Phosphatase: 96 U/L (ref 39–117)
BUN: 23 mg/dL (ref 6–23)
CO2: 25 meq/L (ref 19–32)
Calcium: 9.3 mg/dL (ref 8.4–10.5)
Chloride: 99 meq/L (ref 96–112)
Creatinine, Ser: 1.36 mg/dL — ABNORMAL HIGH (ref 0.40–1.20)
GFR: 36.34 mL/min — ABNORMAL LOW (ref >60.00)
Glucose, Bld: 164 mg/dL — ABNORMAL HIGH (ref 70–99)
Potassium: 3.9 meq/L (ref 3.5–5.1)
Sodium: 135 meq/L (ref 135–145)
Total Bilirubin: 7.8 mg/dL — ABNORMAL HIGH (ref 0.2–1.2)
Total Protein: 5.8 g/dL — ABNORMAL LOW (ref 6.0–8.3)

## 2022-12-18 MED ORDER — NITROFURANTOIN MONOHYD MACRO 100 MG PO CAPS: 100.0000 mg | ORAL_CAPSULE | Freq: Two times a day (BID) | ORAL | 0 refills | Status: AC

## 2022-12-18 MED ORDER — ONDANSETRON 4 MG PO TBDP: 4.0000 mg | ORAL_TABLET | Freq: Three times a day (TID) | ORAL | 0 refills | Status: DC | PRN

## 2022-12-18 NOTE — Patient Instructions (Signed)
Stop by the lab prior to leaving today. I will notify you of your results once received.   Start Macrobid (nitrofurantoin) tablets for urinary tract infection. Take 1 tablet by mouth twice daily for 5 days.  You may take the ondansetron antinausea medicine every 8 hours as needed for nausea/decreased appetite.  It was a pleasure to see you today!

## 2022-12-18 NOTE — Progress Notes (Signed)
Subjective:    Patient ID: Courtney Stone, female    DOB: [birth year], 82 y.o.   MRN: [id number]  Emesis  Associated symptoms include chills and a fever. Pertinent negatives include no abdominal pain, coughing or diarrhea.    Courtney Stone is a very pleasant 82 y.o. female with a history of hypertension, OSA, type 2 diabetes, CKD, chronic fatigue, who presents today to discuss nausea and vomiting.  Symptom onset two evenings ago with umbilical abdominal pain, nausea, and vomiting that began about 3 hours after eating a large dinner (large spaghetti dinner with a lot of tomatoes and sauce). She had 2 episodes of vomiting that evening, took 1 Tums, and went to bed.  She did not lay flat within 2 hours after eating her meal.  The following morning (yesterday morning) she felt weak, fatigued, short of breath, dark colored urine. She spent most of her day yesterday sleeping. Today she feels weak, tired, body aches (neck down to waist). Yesterday she had a low grade fever (99.3).   She denies fevers, changes in bowel movements, dysuria, increased urinary frequency, abdominal pain, flank pain, chest pressure, esophageal burning. Her husband had the same meal two evenings ago, he had no symptoms.   She was able to eat some yesterday. This morning she ate three slices of apple with peanut butter. She's been hydrating well with water.   Review of Systems  Constitutional:  Positive for appetite change, chills, fatigue and fever.  HENT:  Negative for congestion, postnasal drip, rhinorrhea and sore throat.   Respiratory:  Negative for cough.   Gastrointestinal:  Positive for nausea and vomiting. Negative for abdominal pain, constipation and diarrhea.         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    2 children, 2 grand children.   Lives in [place].   Retired, once worked as a Runner, broadcasting/film/video.    Enjoys spending time with family, reading.    Social Determinants of Health   Financial Resource Strain: Low Risk  (12/18/2022)   Overall Financial Resource Strain (CARDIA)    Difficulty of Paying Living Expenses: Not hard at all  Food Insecurity: No Food Insecurity (12/18/2022)   Hunger Vital Sign    Worried About Running Out of Food in the Last Year: Never true    Ran Out of Food in the Last Year: Never true  Transportation Needs: No Transportation Needs (12/18/2022)   PRAPARE - Administrator, Civil Service (Medical): No    Lack of Transportation (Non-Medical): No  Physical Activity: Unknown (12/18/2022)   Exercise Vital Sign    Days of Exercise per  Week: 0 days    Minutes of Exercise per Session: Not on file  Stress: Stress Concern Present (12/18/2022)   [employer] of Occupational Health - Occupational Stress Questionnaire    Feeling of Stress : To some extent  Social Connections: Moderately Integrated (12/18/2022)   Social Connection and Isolation Panel [NHANES]    Frequency of Communication with Friends and Family: Once a week    Frequency of Social Gatherings with Friends and Family: Once a week    Attends Religious Services: 1 to 4 times per year    Active Member of [employer] or Organizations: Yes    Attends Banker Meetings: 1 to 4 times per year     Marital Status: Married  Catering manager Violence: Not At Risk (04/16/2021)   Humiliation, Afraid, Rape, and Kick questionnaire    Fear of Current or Ex-Partner: No    Emotionally Abused: No    Physically Abused: No    Sexually Abused: No    Past Surgical History:  Procedure Laterality Date   ABDOMINAL HYSTERECTOMY  1976   complete   BREAST BIOPSY Right    neg   BREAST SURGERY  1968   benign   CATARACT EXTRACTION, BILATERAL  2016   [facility]    NECK SURGERY  1993   ?bone spur removal   URETHRA SURGERY     WRIST FRACTURE SURGERY      Family History  Problem Relation Age of Onset   Cancer Mother        lung   Cancer Father    COPD Father    Diabetes Sister    Hypertension Sister    Breast cancer Paternal Grandmother 83    Allergies  Allergen Reactions   Adhesive [Tape]    Sulfa Antibiotics     Headache   Latex Rash    Current Outpatient Medications on File Prior to Visit  Medication Sig Dispense Refill   Fluocinolone Acetonide 0.01 % OIL      FLUoxetine (PROZAC) 10 MG tablet TAKE 1 TABLET(10 MG) BY MOUTH DAILY FOR ANXIETY OR DEPRESSION 90 tablet 2   fluticasone (FLONASE) 50 MCG/ACT nasal spray Place 1 spray into both nostrils 2 (two) times daily. 48 g 0   glipiZIDE (GLUCOTROL XL) 10 MG 24 hr tablet TAKE 1 TABLET BY MOUTH DAILY WITH BREAKFAST FOR DIABETES 90 tablet 1   glucose blood test strip Use as instructed 100 each 5   JANUVIA 100 MG tablet TAKE 1 TABLET(100 MG) BY MOUTH DAILY FOR DIABETES 90 tablet 1   lisinopril (ZESTRIL) 10 MG tablet Take 1 tablet (10 mg total) by mouth daily. for blood pressure. 90 tablet 3   metFORMIN (GLUCOPHAGE-XR) 500 MG 24 hr tablet TAKE 2 TABLETS BY MOUTH TWICE DAILY WITH A MEAL FOR DIABETES 360 tablet 1   No current facility-administered medications on file prior to visit.    BP 124/68   Pulse 87   Temp (!) 97.5 F (36.4 C) (Temporal)   Ht 5\' 7"  (1.702 m)   Wt 243 lb (110.2 kg)   SpO2 97%   BMI 38.06 kg/m   Objective:   Physical Exam Constitutional:      Appearance: She is ill-appearing.  Cardiovascular:     Rate and Rhythm: Normal rate and regular rhythm.  Pulmonary:     Effort: Pulmonary effort is normal.     Breath sounds: Normal breath sounds.  Abdominal:     General: Bowel sounds are normal.  Palpations: Abdomen is soft.     Tenderness: There is no abdominal tenderness. There is no right CVA tenderness or left CVA tenderness.  Musculoskeletal:     Cervical back: Neck supple.  Skin:    General: Skin is warm and dry.  Neurological:     Mental Status: She is alert and oriented to person, place, and time.  Psychiatric:        Mood and Affect: Mood normal.           Assessment & Plan:  Nausea and vomiting, unspecified vomiting type Assessment & Plan: Initially, symptoms were thought to be secondary from esophageal reflux given her large acidic pasta meal; however, urinalysis is suspicious for cystitis. Need to rule out pyelonephritis, although her presentation does not suggest such.  Urinalysis today with 2+ leuks, positive nitrites, trace blood, ketones, bilirubin. Urine culture ordered and pending.  Start Macrobid (nitrofurantoin) tablets for urinary tract infection. Take 1 tablet by mouth twice daily for 5 days. Prescription for Zofran 4 mg sent to pharmacy to take every 8 hours as needed.  Checking labs today including CBC with differential, CMP. A1c is up-to-date.  Hospital precautions provided.  Orders: -     CBC with Differential/Platelet -     Comprehensive metabolic panel -     Urine Culture -     POCT Urinalysis Dipstick (Automated) -     Ondansetron; Take 1 tablet (4 mg total) by mouth every 8 (eight) hours as needed for nausea or vomiting.  Dispense: 15 tablet; Refill: 0 -     Nitrofurantoin Monohyd Macro; Take 1 capsule (100 mg total) by mouth 2 (two) times daily for 5 days.  Dispense: 10 capsule; Refill: 0        Doreene Nest, NP

## 2022-12-18 NOTE — Assessment & Plan Note (Addendum)
Initially, symptoms were thought to be secondary from esophageal reflux given her large acidic pasta meal; however, urinalysis is suspicious for cystitis. Need to rule out pyelonephritis, although her presentation does not suggest such.  Urinalysis today with 2+ leuks, positive nitrites, trace blood, ketones, bilirubin. Urine culture ordered and pending.  Start Macrobid (nitrofurantoin) tablets for urinary tract infection. Take 1 tablet by mouth twice daily for 5 days. Prescription for Zofran 4 mg sent to pharmacy to take every 8 hours as needed.  Checking labs today including CBC with differential, CMP. A1c is up-to-date.  Hospital precautions provided.

## 2022-12-19 LAB — URINE CULTURE
MICRO NUMBER:: 15787809
SPECIMEN QUALITY:: ADEQUATE

## 2022-12-20 ENCOUNTER — Ambulatory Visit
Admission: RE | Admit: 2022-12-20 | Discharge: 2022-12-20 | Disposition: A | Discharge status: Home / Self Care | Payer: Medicare PPO | Source: Ambulatory Visit | Attending: Primary Care | Admitting: Primary Care

## 2022-12-20 DIAGNOSIS — N3001 Acute cystitis with hematuria: Secondary | ICD-10-CM | POA: Diagnosis not present

## 2022-12-20 DIAGNOSIS — K5750 Diverticulosis of both small and large intestine without perforation or abscess without bleeding: Secondary | ICD-10-CM | POA: Diagnosis not present

## 2022-12-20 DIAGNOSIS — R112 Nausea with vomiting, unspecified: Secondary | ICD-10-CM | POA: Diagnosis not present

## 2022-12-20 DIAGNOSIS — R1084 Generalized abdominal pain: Secondary | ICD-10-CM | POA: Diagnosis not present

## 2022-12-20 DIAGNOSIS — N281 Cyst of kidney, acquired: Secondary | ICD-10-CM | POA: Diagnosis not present

## 2022-12-20 DIAGNOSIS — K8020 Calculus of gallbladder without cholecystitis without obstruction: Secondary | ICD-10-CM | POA: Diagnosis not present

## 2022-12-20 MED ORDER — IOHEXOL 300 MG/ML  SOLN
80.0000 mL | Freq: Once | INTRAMUSCULAR | Status: AC | PRN
  Administered 2022-12-20: 80 mL via INTRAVENOUS

## 2023-03-05 DIAGNOSIS — E1165 Type 2 diabetes mellitus with hyperglycemia: Secondary | ICD-10-CM

## 2023-03-06 MED ORDER — GLUCOSE BLOOD VI STRP: ORAL_STRIP | 5 refills | Status: DC

## 2023-03-10 ENCOUNTER — Other Ambulatory Visit: Payer: Self-pay

## 2023-03-24 ENCOUNTER — Telehealth: Payer: Self-pay

## 2023-03-24 NOTE — Telephone Encounter (Signed)
 Copied from CRM [id number]. Topic: Clinical - Prescription Issue >> Mar 24, 2023  2:39 PM Denese Killings wrote: Reason for CRM: Trinna Post with [facility] is requesting clinical questions to be answered. Trinna Post states that they need clarification on 5 questions. This is for Prior Authorization for medication One touch ultra test strip/ diabetic meter.

## 2023-03-26 ENCOUNTER — Other Ambulatory Visit (HOSPITAL_COMMUNITY): Payer: Self-pay

## 2023-03-26 ENCOUNTER — Telehealth: Payer: Self-pay

## 2023-03-26 DIAGNOSIS — E1165 Type 2 diabetes mellitus with hyperglycemia: Secondary | ICD-10-CM

## 2023-03-26 NOTE — Telephone Encounter (Signed)
 Pharmacy Patient Advocate Encounter   Received notification from Pt Calls Messages that prior authorization for OneTouch Ultra Test strips is required/requested.   Insurance verification completed.   The patient is insured through [employer] .   Per test claim:  Accu-chek or True Metrix is preferred by the insurance.  If suggested medication is appropriate, Please send in a new RX and discontinue this one. If not, please advise as to why it's not appropriate so that we may request a Prior Authorization. Please note, some preferred medications may still require a PA.  If the suggested medications have not been trialed and there are no contraindications to their use, the PA will not be submitted, as it will not be approved.   PA was denied for One Touch. [id number]

## 2023-03-27 MED ORDER — GLUCOSE BLOOD VI STRP: ORAL_STRIP | 5 refills | Status: AC

## 2023-03-27 NOTE — Addendum Note (Signed)
 Addended by: Eden Emms on: 03/27/2023 10:26 AM   Modules accepted: Orders

## 2023-04-14 ENCOUNTER — Ambulatory Visit (INDEPENDENT_AMBULATORY_CARE_PROVIDER_SITE_OTHER): Admitting: Internal Medicine

## 2023-04-14 VITALS — BP 138/74 | HR 70 | Resp 16 | Ht 67.0 in | Wt 242.0 lb

## 2023-04-14 DIAGNOSIS — E669 Obesity, unspecified: Secondary | ICD-10-CM

## 2023-04-14 DIAGNOSIS — Z7189 Other specified counseling: Secondary | ICD-10-CM | POA: Insufficient documentation

## 2023-04-14 DIAGNOSIS — G4733 Obstructive sleep apnea (adult) (pediatric): Secondary | ICD-10-CM

## 2023-04-14 NOTE — Progress Notes (Unsigned)
 [facility] [address] [place], [place] [address]  Pulmonary Sleep Medicine   Office Visit Note  Patient Name: Courtney Stone DOB: [birth year] MRN [id number]    Chief Complaint: Obstructive Sleep Apnea visit  Brief History:  Alaisha is seen today for an annual follow up visit for APAP@ 5-12 cmH2O. The patient has a 8 year history of sleep apnea. Patient is using PAP nightly.  The patient feels rested after sleeping with PAP.  The patient reports benefiting from PAP use. Reported sleepiness is  improved and the Epworth Sleepiness Score is 5 out of 24. The patient will occasionally take naps. The patient complains of the following: none.  The compliance download shows 93% compliance with an average use time of 6 hours 52 minutes. The AHI is 0.9.  The patient does not complain of limb movements disrupting sleep. The patient continues to require PAP therapy in order to eliminate sleep apnea.   ROS  General: (-) fever, (-) chills, (-) night sweat Nose and Sinuses: (-) nasal stuffiness or itchiness, (-) postnasal drip, (-) nosebleeds, (-) sinus trouble. Mouth and Throat: (-) sore throat, (-) hoarseness. Neck: (-) swollen glands, (-) enlarged thyroid, (-) neck pain. Respiratory: - cough, - shortness of breath, - wheezing. Neurologic: - numbness, - tingling. Psychiatric: + anxiety, + depression   Current Medication: Outpatient Encounter Medications as of 04/14/2023  Medication Sig   Fluocinolone Acetonide 0.01 % OIL    FLUoxetine (PROZAC) 10 MG tablet TAKE 1 TABLET(10 MG) BY MOUTH DAILY FOR ANXIETY OR DEPRESSION   fluticasone (FLONASE) 50 MCG/ACT nasal spray Place 1 spray into both nostrils 2 (two) times daily.   glipiZIDE (GLUCOTROL XL) 10 MG 24 hr tablet TAKE 1 TABLET BY MOUTH DAILY WITH BREAKFAST FOR DIABETES   glucose blood test strip Use to check blood sugar up to 3 times daily   JANUVIA 100 MG tablet TAKE 1 TABLET(100 MG) BY MOUTH DAILY FOR DIABETES   lisinopril  (ZESTRIL) 10 MG tablet Take 1 tablet (10 mg total) by mouth daily. for blood pressure.   metFORMIN (GLUCOPHAGE-XR) 500 MG 24 hr tablet TAKE 2 TABLETS BY MOUTH TWICE DAILY WITH A MEAL FOR DIABETES   ondansetron (ZOFRAN-ODT) 4 MG disintegrating tablet Take 1 tablet (4 mg total) by mouth every 8 (eight) hours as needed for nausea or vomiting.   No facility-administered encounter medications on file as of 04/14/2023.    Surgical History: Past Surgical History:  Procedure Laterality Date   ABDOMINAL HYSTERECTOMY  1976   complete   BREAST BIOPSY Right    neg   BREAST SURGERY  1968   benign   CATARACT EXTRACTION, BILATERAL  2016   [facility]    NECK SURGERY  1993   ?bone spur removal   URETHRA SURGERY     WRIST FRACTURE SURGERY      Medical History: Past Medical History:  Diagnosis Date   Anxiety and depression    Diabetes mellitus    HTN (hypertension)    120/80's at home   Thyroid disorder     Family History: Non contributory to the present illness  Social History: Social History   Socioeconomic History   Marital status: Married    Spouse name: Leonette Most   Number of children: 2   Years of education: Not on file   Highest education level: Master's degree (e.g., MA, MS, MEng, MEd, MSW, MBA)  Occupational History   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    2 children, 2 grand children.   Lives in [place].   Retired, once worked as a Runner, broadcasting/film/video.    Enjoys spending time with family, reading.    Social Drivers of Corporate investment banker Strain: Low Risk  (12/18/2022)   Overall Financial Resource Strain (CARDIA)    Difficulty of Paying Living Expenses: Not hard at  all  Food Insecurity: No Food Insecurity (12/18/2022)   Hunger Vital Sign    Worried About Running Out of Food in the Last Year: Never true    Ran Out of Food in the Last Year: Never true  Transportation Needs: No Transportation Needs (12/18/2022)   PRAPARE - Administrator, Civil Service (Medical): No    Lack of Transportation (Non-Medical): No  Physical Activity: Unknown (12/18/2022)   Exercise Vital Sign    Days of Exercise per Week: 0 days    Minutes of Exercise per Session: Not on file  Stress: Stress Concern Present (12/18/2022)   [employer] of Occupational Health - Occupational Stress Questionnaire    Feeling of Stress : To some extent  Social Connections: Moderately Integrated (12/18/2022)   Social Connection and Isolation Panel [NHANES]    Frequency of Communication with Friends and Family: Once a week    Frequency of Social Gatherings with Friends and Family: Once a week    Attends Religious Services: 1 to 4 times per year    Active Member of [employer] or Organizations: Yes    Attends Banker Meetings: 1 to 4 times per year    Marital Status: Married  Catering manager Violence: Not At Risk (04/16/2021)   Humiliation, Afraid, Rape, and Kick questionnaire    Fear of Current or Ex-Partner: No    Emotionally Abused: No    Physically Abused: No    Sexually Abused: No    Vital Signs: Blood pressure 138/74, pulse 70, resp. rate 16, height 5\' 7"  (1.702 m), weight 242 lb (109.8 kg), SpO2 97%. Body mass index is 37.9 kg/m.    Examination: General Appearance: The patient is well-developed, well-nourished, and in no distress. Neck Circumference: 39 cm Skin: Gross inspection of skin unremarkable. Head: normocephalic, no gross deformities. Eyes: no gross deformities noted. ENT: ears appear grossly normal Neurologic: Alert and oriented. No involuntary movements.  STOP BANG RISK ASSESSMENT S (snore) Have you been told that you snore?     NO   T  (tired) Are you often tired, fatigued, or sleepy during the day?   NO  O (obstruction) Do you stop breathing, choke, or gasp during sleep? NO   P (pressure) Do you have or are you being treated for high blood pressure? YES   B (BMI) Is your body index greater than 35 kg/m? YES   A (age) Are you 57 years old or older? YES   N (neck) Do you have a neck circumference greater than 16 inches?   NO   G (gender) Are you a female? NO   TOTAL STOP/BANG "YES" ANSWERS 3       A STOP-Bang score of 2 or less is considered low risk, and a score of 5 or more is high  risk for having either moderate or severe OSA. For people who score 3 or 4, doctors may need to perform further assessment to determine how likely they are to have OSA.         EPWORTH SLEEPINESS SCALE:  Scale:  (0)= no chance of dozing; (1)= slight chance of dozing; (2)= moderate chance of dozing; (3)= high chance of dozing  Chance  Situtation    Sitting and reading: 1    Watching TV: 1    Sitting Inactive in public: 0    As a passenger in car: 1      Lying down to rest: 1    Sitting and talking: 0    Sitting quielty after lunch: 1    In a car, stopped in traffic: 0   TOTAL SCORE:   5 out of 24    SLEEP STUDIES:  HST - 10/03/2014  @ [facility] - REI 15/hr. Min Sp02 81% No titration on file   CPAP COMPLIANCE DATA:  Date Range: 04/09/2022-04/08/2023  Average Daily Use: 6 hours 52 minutes  Median Use: 7 hours 4 minutes  Compliance for > 4 Hours: 93%  AHI: 0.9 respiratory events per hour  Days Used: 348/365 days  Mask Leak: 31.7  95th Percentile Pressure: 11.6         LABS: No results found for this or any previous visit (from the past 2160 hours).  Radiology: CT ABDOMEN PELVIS W CONTRAST Result Date: 12/20/2022 CLINICAL DATA:  Abdominal pain, acute, nonlocalized. Nausea and vomiting. EXAM: CT ABDOMEN AND PELVIS WITH CONTRAST TECHNIQUE: Multidetector CT imaging of the abdomen and pelvis  was performed using the standard protocol following bolus administration of intravenous contrast. RADIATION DOSE REDUCTION: This exam was performed according to the departmental dose-optimization program which includes automated exposure control, adjustment of the mA and/or kV according to patient size and/or use of iterative reconstruction technique. CONTRAST:  80mL OMNIPAQUE IOHEXOL 300 MG/ML  SOLN COMPARISON:  None Available. FINDINGS: Lower chest: Mild dependent atelectasis or scarring at both lung bases. No significant pleural or pericardial effusion. Possible small hiatal hernia. Hepatobiliary: The liver is normal in density without suspicious focal abnormality. Multiple calcified gallstones. No evidence of gallbladder wall thickening, surrounding inflammation or biliary ductal dilatation. Pancreas: Unremarkable. No pancreatic ductal dilatation or surrounding inflammatory changes. Spleen: Normal in size without focal abnormality. Adrenals/Urinary Tract: Both adrenal glands appear normal. No evidence of urinary tract calculus, suspicious renal lesion or hydronephrosis. There are renal sinus cysts bilaterally for which no specific follow-up imaging is recommended. No perinephric soft tissue stranding. The bladder appears unremarkable for its degree of distention. Stomach/Bowel: No enteric contrast administered. The stomach appears unremarkable for its degree of distention. No small bowel wall thickening, distention or surrounding inflammation. Diverticulosis of the distal small bowel. The appendix and proximal colon appear normal. Scattered colonic diverticulosis, greatest within the sigmoid colon. No definite evidence of acute diverticulitis. Vascular/Lymphatic: There are no enlarged abdominal or pelvic lymph nodes. Minimal aortic atherosclerosis. No evidence of aneurysm or large vessel occlusion. Reproductive: Status post hysterectomy. Possible residual ovarian tissue bilaterally. No suspicious adnexal  findings. Other: Trace free pelvic fluid focal extraluminal fluid collection, pneumoperitoneum or abdominal wall hernia. Musculoskeletal: No acute or significant osseous findings. Multilevel lumbar spondylosis. IMPRESSION: 1. No acute findings or clear explanation for the patient's symptoms. 2. Cholelithiasis without evidence of acute cholecystitis or biliary ductal dilatation. 3. Distal small bowel and colonic diverticulosis without evidence of acute diverticulitis. Trace free pelvic fluid. Electronically Signed  By: Carey Bullocks M.D.   On: 12/20/2022 15:51    No results found.  No results found.    Assessment and Plan: Patient Active Problem List   Diagnosis Date Noted   CPAP use counseling 04/14/2023   Nausea and vomiting 12/18/2022   CKD (chronic kidney disease) stage 3, GFR 30-59 ml/min (HCC) 05/22/2022   OSA on CPAP 11/29/2019   Preventative health care 09/16/2017   Multinodular goiter 11/15/2016   Adjustment disorder with mixed anxiety and depressed mood 02/01/2015   Regular astigmatism of left eye 12/27/2014   Regular astigmatism of right eye 12/12/2014   Senile nuclear sclerosis 12/12/2014   Chronic fatigue 09/13/2014   Medicare annual wellness visit, subsequent 02/05/2013   Thrombocytopenia (HCC) 09/30/2011   Type 2 diabetes mellitus with hyperglycemia (HCC) 07/29/2011   Thyrotoxicosis 07/29/2011   Obesity (BMI 30-39.9) 06/21/2011   Hypertension 12/18/2010   Bilateral knee pain 01/22/2008   1. OSA on CPAP (Primary) The patient does tolerate PAP and reports  benefit from PAP use. The patient was reminded how to clean equipment and advised to replace supplies routinely. The patient was also counselled on weight loss. The compliance is very good. The AHI is 0.9.   OSA on cpap- controlled. Continue with excellent compliance with pap. CPAP continues to be medically necessary to treat this patient's OSA. F/u one year.    2. CPAP use counseling CPAP Counseling: had a  lengthy discussion with the patient regarding the importance of PAP therapy in management of the sleep apnea. Patient appears to understand the risk factor reduction and also understands the risks associated with untreated sleep apnea. Patient will try to make a good faith effort to remain compliant with therapy. Also instructed the patient on proper cleaning of the device including the water must be changed daily if possible and use of distilled water is preferred. Patient understands that the machine should be regularly cleaned with appropriate recommended cleaning solutions that do not damage the PAP machine for example given white vinegar and water rinses. Other methods such as ozone treatment may not be as good as these simple methods to achieve cleaning.   3. Obesity (BMI 30-39.9) Obesity Counseling: Had a lengthy discussion regarding patients BMI and weight issues. Patient was instructed on portion control as well as increased activity. Also discussed caloric restrictions with trying to maintain intake less than 2000 Kcal. Discussions were made in accordance with the 5As of weight management. Simple actions such as not eating late and if able to, taking a walk is suggested.      General Counseling: I have discussed the findings of the evaluation and examination with Vinisha.  I have also discussed any further diagnostic evaluation thatmay be needed or ordered today. Johnnisha verbalizes understanding of the findings of todays visit. We also reviewed her medications today and discussed drug interactions and side effects including but not limited excessive drowsiness and altered mental states. We also discussed that there is always a risk not just to her but also people around her. she has been encouraged to call the office with any questions or concerns that should arise related to todays visit.  No orders of the defined types were placed in this encounter.       I have personally obtained a history,  examined the patient, evaluated laboratory and imaging results, formulated the assessment and plan and placed orders. This patient was seen today by Emmaline Kluver, PA-C in collaboration with Dr. Freda Munro.   Emerly Prak  Crista Luria, MD [facility] Diplomate ABMS Pulmonary Critical Care Medicine and Sleep Medicine

## 2023-04-14 NOTE — Patient Instructions (Signed)

## 2023-04-24 DIAGNOSIS — H6121 Impacted cerumen, right ear: Secondary | ICD-10-CM | POA: Diagnosis not present

## 2023-04-24 DIAGNOSIS — H6061 Unspecified chronic otitis externa, right ear: Secondary | ICD-10-CM | POA: Diagnosis not present

## 2023-04-30 ENCOUNTER — Encounter: Payer: Self-pay | Admitting: Primary Care

## 2023-04-30 ENCOUNTER — Ambulatory Visit: Payer: Medicare PPO | Admitting: Primary Care

## 2023-04-30 VITALS — BP 98/56 | HR 75 | Temp 97.2°F | Ht 67.0 in | Wt 240.0 lb

## 2023-04-30 DIAGNOSIS — R7989 Other specified abnormal findings of blood chemistry: Secondary | ICD-10-CM | POA: Diagnosis not present

## 2023-04-30 DIAGNOSIS — E1165 Type 2 diabetes mellitus with hyperglycemia: Secondary | ICD-10-CM

## 2023-04-30 DIAGNOSIS — I10 Essential (primary) hypertension: Secondary | ICD-10-CM

## 2023-04-30 DIAGNOSIS — Z7984 Long term (current) use of oral hypoglycemic drugs: Secondary | ICD-10-CM | POA: Diagnosis not present

## 2023-04-30 DIAGNOSIS — N1831 Chronic kidney disease, stage 3a: Secondary | ICD-10-CM

## 2023-04-30 DIAGNOSIS — G4733 Obstructive sleep apnea (adult) (pediatric): Secondary | ICD-10-CM

## 2023-04-30 DIAGNOSIS — Z0001 Encounter for general adult medical examination with abnormal findings: Secondary | ICD-10-CM | POA: Diagnosis not present

## 2023-04-30 DIAGNOSIS — F4323 Adjustment disorder with mixed anxiety and depressed mood: Secondary | ICD-10-CM | POA: Diagnosis not present

## 2023-04-30 LAB — BASIC METABOLIC PANEL WITH GFR
BUN: 21 mg/dL (ref 6–23)
CO2: 27 meq/L (ref 19–32)
Calcium: 9.6 mg/dL (ref 8.4–10.5)
Chloride: 104 meq/L (ref 96–112)
Creatinine, Ser: 1.06 mg/dL (ref 0.40–1.20)
GFR: 48.89 mL/min — ABNORMAL LOW (ref >60.00)
Glucose, Bld: 166 mg/dL — ABNORMAL HIGH (ref 70–99)
Potassium: 4.1 meq/L (ref 3.5–5.1)
Sodium: 139 meq/L (ref 135–145)

## 2023-04-30 LAB — POCT GLYCOSYLATED HEMOGLOBIN (HGB A1C): Hemoglobin A1C: 9.3 % — AB (ref 4.0–5.6)

## 2023-04-30 LAB — MICROALBUMIN / CREATININE URINE RATIO
Creatinine,U: 85.4 mg/dL
Microalb Creat Ratio: UNDETERMINED mg/g (ref 0.0–30.0)
Microalb, Ur: <0.7 mg/dL

## 2023-04-30 LAB — TSH: TSH: 7.91 u[IU]/mL — ABNORMAL HIGH (ref 0.35–5.50)

## 2023-04-30 LAB — LIPID PANEL
Cholesterol: 121 mg/dL (ref 0–200)
HDL: 40.4 mg/dL (ref >39.00)
LDL Cholesterol: 47 mg/dL (ref 0–99)
NonHDL: 80.55
Total CHOL/HDL Ratio: 3
Triglycerides: 170 mg/dL — ABNORMAL HIGH (ref 0.0–149.0)
VLDL: 34 mg/dL (ref 0.0–40.0)

## 2023-04-30 MED ORDER — RYBELSUS 3 MG PO TABS: 3.0000 mg | ORAL_TABLET | Freq: Every day | ORAL | 0 refills | Status: DC

## 2023-04-30 NOTE — Assessment & Plan Note (Signed)
 Declines Prevnar 20, discussed Shingrix vaccines. Declines mammogram and bone density scan   Discussed the importance of a healthy diet and regular exercise in order for weight loss, and to reduce the risk of further co-morbidity.  Exam stable. Labs pending.  Follow up in 1 year for repeat physical.

## 2023-04-30 NOTE — Assessment & Plan Note (Signed)
Continue CPAP nightly. °

## 2023-04-30 NOTE — Assessment & Plan Note (Signed)
 Repeat kidney function pending.  Not on NSAIDs, drinks plenty of water.

## 2023-04-30 NOTE — Assessment & Plan Note (Signed)
-

## 2023-04-30 NOTE — Assessment & Plan Note (Signed)
 Deteriorated and uncontrolled with A1c of 9.3.  Discussed to work on portion sizes and to avoid overeating. We also discussed to limit carbs and increase physical activity when able.  She is maxed out on 3 oral medications, is not interested in injectable GLP-1 treatment. We discussed oral GLP-1 treatment and she is interested.  Continue metformin 1000 mg twice daily, glipizide XL 10 mg daily Add Rybelsus 3 mg daily x 4 weeks, then increase to 7 mg daily, then titrate up to 14 mg if warranted.  Urine microalbumin due and pending.  Close follow-up in 3 months.

## 2023-04-30 NOTE — Assessment & Plan Note (Signed)
 Low today, not typical for patient. She is dehydrated.  Continue lisinopril 10 mg for now.  Recommended to check BP at home and notify if readings are low.  CMP pending.

## 2023-04-30 NOTE — Patient Instructions (Signed)
 Stop taking sitagliptin (Januvia) for diabetes.  Start Rybelsus 3 mg tablets once daily for diabetes.  Take 3 mg daily x 30 days, then may increase your dose to 7 mg daily.  Stop by the lab prior to leaving today. I will notify you of your results once received.   Please schedule a follow up visit for 3 months.  It was a pleasure to see you today!

## 2023-04-30 NOTE — Progress Notes (Signed)
 Subjective:    Patient ID: Courtney Stone, female    DOB: 06/19/1940, 83 y.o.   MRN: [id number]  HPI  Courtney Stone is a very pleasant 83 y.o. female who presents today for complete physical and follow up of chronic conditions.  She has noticed hyperglycemia since November 2024 after acute illness. Glucose has been running in the low to mid 200s since. She is compliant to her Glipizide XL 10 mg daily, metformin 1000 mg BID, and Januvia 100 mg daily. She endorses a healthy diet. Does not exercise.   Diet currently consists of:  Breakfast: Cereal Lunch: Chicken and veggies Dinner: Soup, potatoes, protein, veggie  Snacks: yogurt, sherbet, cookies  Desserts: 2 times weekly Beverages: Coffee, water  Exercise: None   Immunizations: -Influenza: Completed this season  -Shingles: Completed Zostavax, never completed Shingrix -Pneumonia: Completed Prevnar 13 in 2015, Pneumovax 23 in 2020  Diet: Fair diet. Eats starchy foods.  Exercise: No regular exercise.  Eye exam: Completes annually  Dental exam: Completes semi-annually    Mammogram: Completed in 2017 Bone Density Scan: Completed in 2017  BP Readings from Last 3 Encounters:  04/30/23 (!) 98/56  04/14/23 138/74  12/18/22 124/68       Review of Systems  Constitutional:  Negative for unexpected weight change.  HENT:  Negative for rhinorrhea.   Respiratory:  Negative for cough and shortness of breath.   Cardiovascular:  Negative for chest pain.  Gastrointestinal:  Negative for constipation and diarrhea.  Genitourinary:  Negative for difficulty urinating.  Musculoskeletal:  Negative for arthralgias and myalgias.  Skin:  Negative for rash.  Allergic/Immunologic: Negative for environmental allergies.  Neurological:  Negative for dizziness, numbness and headaches.  Psychiatric/Behavioral:  The patient is not nervous/anxious.          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    2 children, 2 grand children.   Lives in [place].   Retired, once worked as a Runner, broadcasting/film/video.    Enjoys spending time with family, reading.    Social Drivers of Corporate investment banker Strain: Low Risk  (12/18/2022)   Overall Financial Resource Strain (CARDIA)    Difficulty of Paying Living Expenses: Not hard at all  Food Insecurity: No Food Insecurity (12/18/2022)   Hunger Vital Sign    Worried About Running Out of Food in the Last Year: Never true    Ran Out of Food in the Last Year: Never true  Transportation Needs: No Transportation Needs (12/18/2022)   PRAPARE - Administrator, Civil Service (Medical): No  Lack of Transportation (Non-Medical): No  Physical Activity: Unknown (12/18/2022)   Exercise Vital Sign    Days of Exercise per Week: 0 days    Minutes of Exercise per Session: Not on file  Stress: Stress Concern Present (12/18/2022)   [employer] of Occupational Health - Occupational Stress Questionnaire    Feeling of Stress : To some extent  Social Connections: Moderately Integrated (12/18/2022)   Social Connection and Isolation Panel [NHANES]    Frequency of Communication with Friends and Family: Once a week    Frequency of  Social Gatherings with Friends and Family: Once a week    Attends Religious Services: 1 to 4 times per year    Active Member of [employer] or Organizations: Yes    Attends Banker Meetings: 1 to 4 times per year    Marital Status: Married  Catering manager Violence: Not At Risk (04/16/2021)   Humiliation, Afraid, Rape, and Kick questionnaire    Fear of Current or Ex-Partner: No    Emotionally Abused: No    Physically Abused: No    Sexually Abused: No    Past Surgical History:  Procedure Laterality Date   ABDOMINAL HYSTERECTOMY  1976   complete   BREAST BIOPSY Right    neg   BREAST SURGERY  1968   benign   CATARACT EXTRACTION, BILATERAL  2016   [facility]    NECK SURGERY  1993   ?bone spur removal   URETHRA SURGERY     WRIST FRACTURE SURGERY      Family History  Problem Relation Age of Onset   Cancer Mother        lung   Cancer Father    COPD Father    Diabetes Sister    Hypertension Sister    Breast cancer Paternal Grandmother 51    Allergies  Allergen Reactions   Adhesive [Tape]    Sulfa Antibiotics     Headache   Latex Rash    Current Outpatient Medications on File Prior to Visit  Medication Sig Dispense Refill   Fluocinolone Acetonide 0.01 % OIL      FLUoxetine (PROZAC) 10 MG tablet TAKE 1 TABLET(10 MG) BY MOUTH DAILY FOR ANXIETY OR DEPRESSION 90 tablet 2   fluticasone (FLONASE) 50 MCG/ACT nasal spray Place 1 spray into both nostrils 2 (two) times daily. 48 g 0   glipiZIDE (GLUCOTROL XL) 10 MG 24 hr tablet TAKE 1 TABLET BY MOUTH DAILY WITH BREAKFAST FOR DIABETES 90 tablet 1   glucose blood test strip Use to check blood sugar up to 3 times daily 100 each 5   lisinopril (ZESTRIL) 10 MG tablet Take 1 tablet (10 mg total) by mouth daily. for blood pressure. 90 tablet 3   metFORMIN (GLUCOPHAGE-XR) 500 MG 24 hr tablet TAKE 2 TABLETS BY MOUTH TWICE DAILY WITH A MEAL FOR DIABETES 360 tablet 1   No current facility-administered medications on file  prior to visit.    BP (!) 98/56   Pulse 75   Temp (!) 97.2 F (36.2 C) (Temporal)   Ht 5\' 7"  (1.702 m)   Wt 240 lb (108.9 kg)   SpO2 97%   BMI 37.59 kg/m  Objective:   Physical Exam HENT:     Right Ear: Tympanic membrane and ear canal normal.     Left Ear: Tympanic membrane and ear canal normal.  Eyes:     Pupils: Pupils are equal, round, and reactive to light.  Cardiovascular:  Rate and Rhythm: Normal rate and regular rhythm.  Pulmonary:     Effort: Pulmonary effort is normal.     Breath sounds: Normal breath sounds.  Abdominal:     General: Bowel sounds are normal.     Palpations: Abdomen is soft.     Tenderness: There is no abdominal tenderness.  Musculoskeletal:     Cervical back: Neck supple.     Comments: Ambulates with cane, steady gait.  Skin:    General: Skin is warm and dry.  Neurological:     Mental Status: She is alert and oriented to person, place, and time.     Cranial Nerves: No cranial nerve deficit.     Deep Tendon Reflexes:     Reflex Scores:      Patellar reflexes are 2+ on the right side and 2+ on the left side. Psychiatric:        Mood and Affect: Mood normal.           Assessment & Plan:  Encounter for annual general medical examination with abnormal findings in adult Assessment & Plan: Declines Prevnar 20, discussed Shingrix vaccines. Declines mammogram and bone density scan   Discussed the importance of a healthy diet and regular exercise in order for weight loss, and to reduce the risk of further co-morbidity.  Exam stable. Labs pending.  Follow up in 1 year for repeat physical.    Type 2 diabetes mellitus with hyperglycemia, without long-term current use of insulin (HCC) Assessment & Plan: Deteriorated and uncontrolled with A1c of 9.3.  Discussed to work on portion sizes and to avoid overeating. We also discussed to limit carbs and increase physical activity when able.  She is maxed out on 3 oral medications, is not  interested in injectable GLP-1 treatment. We discussed oral GLP-1 treatment and she is interested.  Continue metformin 1000 mg twice daily, glipizide XL 10 mg daily Add Rybelsus 3 mg daily x 4 weeks, then increase to 7 mg daily, then titrate up to 14 mg if warranted.  Urine microalbumin due and pending.  Close follow-up in 3 months.  Orders: -     Microalbumin / creatinine urine ratio -     POCT glycosylated hemoglobin (Hb A1C) -     Lipid panel -     Rybelsus; Take 1 tablet (3 mg total) by mouth daily. for diabetes.  Dispense: 30 tablet; Refill: 0  Primary hypertension Assessment & Plan: Low today, not typical for patient. She is dehydrated.  Continue lisinopril 10 mg for now.  Recommended to check BP at home and notify if readings are low.  CMP pending.  Orders: -     Basic metabolic panel with GFR -     TSH  OSA on CPAP Assessment & Plan: Continue CPAP nightly.   Stage 3a chronic kidney disease (HCC) Assessment & Plan: Repeat kidney function pending.  Not on NSAIDs, drinks plenty of water.    Adjustment disorder with mixed anxiety and depressed mood Assessment & Plan: Stable.  Continue fluoxetine 10 mg daily.          Doreene Nest, NP

## 2023-05-01 ENCOUNTER — Ambulatory Visit (INDEPENDENT_AMBULATORY_CARE_PROVIDER_SITE_OTHER)

## 2023-05-01 DIAGNOSIS — R7989 Other specified abnormal findings of blood chemistry: Secondary | ICD-10-CM | POA: Diagnosis not present

## 2023-05-01 DIAGNOSIS — R946 Abnormal results of thyroid function studies: Secondary | ICD-10-CM | POA: Diagnosis not present

## 2023-05-01 LAB — T4, FREE: Free T4: 0.64 ng/dL (ref 0.60–1.60)

## 2023-05-01 NOTE — Addendum Note (Signed)
 Addended by: Alvina Chou on: 05/01/2023 10:00 AM   Modules accepted: Orders

## 2023-05-07 ENCOUNTER — Encounter: Payer: Self-pay | Admitting: Family Medicine

## 2023-05-07 ENCOUNTER — Ambulatory Visit: Admitting: Family Medicine

## 2023-05-07 VITALS — BP 114/64 | HR 74 | Temp 98.2°F | Ht 67.0 in | Wt 238.2 lb

## 2023-05-07 DIAGNOSIS — K8020 Calculus of gallbladder without cholecystitis without obstruction: Secondary | ICD-10-CM

## 2023-05-07 DIAGNOSIS — Z87440 Personal history of urinary (tract) infections: Secondary | ICD-10-CM | POA: Diagnosis not present

## 2023-05-07 DIAGNOSIS — R112 Nausea with vomiting, unspecified: Secondary | ICD-10-CM | POA: Diagnosis not present

## 2023-05-07 DIAGNOSIS — R82998 Other abnormal findings in urine: Secondary | ICD-10-CM | POA: Diagnosis not present

## 2023-05-07 LAB — POC URINALSYSI DIPSTICK (AUTOMATED)
Blood, UA: POSITIVE
Glucose, UA: NEGATIVE
Ketones, UA: NEGATIVE
Leukocytes, UA: NEGATIVE
Nitrite, UA: POSITIVE
Protein, UA: POSITIVE — AB
Spec Grav, UA: 1.015 (ref 1.010–1.025)
Urobilinogen, UA: NEGATIVE U/dL — AB
pH, UA: 6 (ref 5.0–8.0)

## 2023-05-07 NOTE — Assessment & Plan Note (Signed)
 Results for orders placed or performed in visit on 05/07/23  POCT Urinalysis Dipstick (Automated)   Collection Time: 05/07/23 12:57 PM  Result Value Ref Range   Color, UA Dark Yellow    Clarity, UA CLOUDY    Glucose, UA Negative Negative   Bilirubin, UA 1+    Ketones, UA NEGATIVE    Spec Grav, UA 1.015 1.010 - 1.025   Blood, UA POSITIVE    pH, UA 6.0 5.0 - 8.0   Protein, UA Positive (A) Negative   Urobilinogen, UA negative (A) 0.2 or 1.0 E.U./dL   Nitrite, UA POSITIVE    Leukocytes, UA Negative Negative    Past elevated bilirubin   Sent for culture  Encouraged good fluid intake

## 2023-05-07 NOTE — Progress Notes (Signed)
 Subjective:    Patient ID: Courtney Stone, female    DOB: Jan 06, 1941, 83 y.o.   MRN: [id number]  HPI  Wt Readings from Last 3 Encounters:  05/07/23 238 lb 3.2 oz (108 kg)  04/30/23 240 lb (108.9 kg)  04/14/23 242 lb (109.8 kg)   37.31 kg/m  Vitals:   05/07/23 1224  BP: 114/64  Pulse: 74  Temp: 98.2 F (36.8 C)  SpO2: 98%   83 yo pt of NP Clark with history of DM2 (prevention on oral semaglutide) , HTN, CKD  Also husband died recently -on 2023/05/08  Pt presents for n/v  Possible uti   Started on May 08, 2023-took rybelsus Large lunch  Then several episodes of vomiting at 5:00  Since then just nausea but no vomiting  Dark urine  Drinking water non stop   No dysuria No frequency  No blood in urine  Was constipated  -took laxative and than helped   Feels weak  Blood sugar is up 200s    Felt feverish on Monday Did not take temp   Took zofran for a few days    Today -not much nausea  Worse if she bends over or eats   Did eat a BBQ sandwich last night   Eats large amounts Not healthy        Sunday 04-17-2025was the last time that vomited, she has been feeling nauseated since but has not vomited. She states that in turn it causes her blood sugar to spike. Went up to 292. Normally between 140 and 160     Last suspected uti was in November, but her culture was contaminated   Results for orders placed or performed in visit on 05/07/23  POCT Urinalysis Dipstick (Automated)   Collection Time: 05/07/23 12:57 PM  Result Value Ref Range   Color, UA Dark Yellow    Clarity, UA CLOUDY    Glucose, UA Negative Negative   Bilirubin, UA 1+    Ketones, UA NEGATIVE    Spec Grav, UA 1.015 1.010 - 1.025   Blood, UA POSITIVE    pH, UA 6.0 5.0 - 8.0   Protein, UA Positive (A) Negative   Urobilinogen, UA negative (A) 0.2 or 1.0 E.U./dL   Nitrite, UA POSITIVE    Leukocytes, UA Negative Negative      Lab Results  Component Value Date   NA 139 04/30/2023   K 4.1  04/30/2023   CO2 27 04/30/2023   GLUCOSE 166 (H) 04/30/2023   BUN 21 04/30/2023   CREATININE 1.06 04/30/2023   CALCIUM 9.6 04/30/2023   GFR 48.89 (L) 04/30/2023   Lab Results  Component Value Date   ALT 135 (H) 12/18/2022   AST 87 (H) 12/18/2022   ALKPHOS 96 12/18/2022   BILITOT 7.8 (H) 12/18/2022   Lab Results  Component Value Date   WBC 7.9 12/18/2022   HGB 13.3 12/18/2022   HCT 39.8 12/18/2022   MCV 93.5 12/18/2022   PLT 90.0 (L) 12/18/2022   CT scan 11/2022 IMPRESSION: 1. No acute findings or clear explanation for the patient's symptoms. 2. Cholelithiasis without evidence of acute cholecystitis or biliary ductal dilatation. 3. Distal small bowel and colonic diverticulosis without evidence of acute diverticulitis. Trace free pelvic fluid.   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   Chronic fatigue 09/13/2014   Medicare annual wellness visit, subsequent 02/05/2013   Thrombocytopenia (HCC) 09/30/2011   Type 2 diabetes mellitus with hyperglycemia (HCC) 07/29/2011   Thyrotoxicosis 07/29/2011   Obesity (BMI 30-39.9) 06/21/2011   Hypertension 12/18/2010   Bilateral knee pain 01/22/2008   Past Medical History:  Diagnosis Date   Anxiety and depression    Diabetes mellitus    HTN (hypertension)    120/80's at home   Multinodular goiter 11/15/2016   Nausea and vomiting 12/18/2022   Thyroid disorder    Past Surgical History:  Procedure Laterality Date    ABDOMINAL HYSTERECTOMY  1976   complete   BREAST BIOPSY Right    neg   BREAST SURGERY  1968   benign   CATARACT EXTRACTION, BILATERAL  2016   [facility]    NECK SURGERY  1993   ?bone spur removal   URETHRA SURGERY     WRIST FRACTURE SURGERY     Social History   Tobacco Use   Smoking status: Never   Smokeless tobacco: Never  Vaping Use   Vaping status: Never Used  Substance Use Topics   Alcohol use: No   Drug use: No   Family History  Problem Relation Age of Onset   Cancer Mother        lung   Cancer Father    COPD Father    Diabetes Sister    Hypertension Sister    Breast cancer Paternal Grandmother 100   Allergies  Allergen Reactions   Adhesive [Tape]    Sulfa Antibiotics     Headache   Latex Rash   Current Outpatient Medications on File Prior to Visit  Medication Sig Dispense Refill   Fluocinolone Acetonide 0.01 % OIL      FLUoxetine (PROZAC) 10 MG tablet TAKE 1 TABLET(10 MG) BY MOUTH DAILY FOR ANXIETY OR DEPRESSION 90 tablet 2   fluticasone (FLONASE) 50 MCG/ACT nasal spray Place 1 spray into both nostrils 2 (two) times daily. 48 g 0   glipiZIDE (GLUCOTROL XL) 10 MG 24 hr tablet TAKE 1 TABLET BY MOUTH DAILY WITH BREAKFAST FOR DIABETES 90 tablet 1   glucose blood test strip Use to check blood sugar up to 3 times daily 100 each 5   lisinopril (ZESTRIL) 10 MG tablet Take 1 tablet (10 mg total) by mouth daily. for blood pressure. 90 tablet 3   metFORMIN (GLUCOPHAGE-XR) 500 MG 24 hr tablet TAKE 2 TABLETS BY MOUTH TWICE DAILY WITH A MEAL FOR DIABETES 360 tablet 1   ondansetron (ZOFRAN) 4 MG/5ML solution Take 4 mg by mouth once.     Semaglutide (RYBELSUS) 3 MG TABS Take 1 tablet (3 mg total) by mouth daily. for diabetes. (Patient not taking: Reported on 05/07/2023) 30 tablet 0   No current facility-administered medications on file prior to visit.    Review of Systems  Constitutional:  Negative for activity change, appetite change, fatigue, fever and  unexpected weight change.  HENT:  Negative for congestion, ear pain, rhinorrhea, sinus pressure and sore throat.   Eyes:  Negative for pain, redness and visual disturbance.  Respiratory:  Negative for cough,  shortness of breath and wheezing.   Cardiovascular:  Negative for chest pain and palpitations.  Gastrointestinal:  Positive for nausea and vomiting. Negative for abdominal pain, blood in stool, constipation, diarrhea and rectal pain.       Had bloating and abd pain one day  This is resolved    Today -slight nausea   Endocrine: Negative for polydipsia and polyuria.  Genitourinary:  Negative for dysuria, frequency and urgency.  Musculoskeletal:  Negative for arthralgias, back pain and myalgias.  Skin:  Negative for pallor and rash.  Allergic/Immunologic: Negative for environmental allergies.  Neurological:  Negative for dizziness, syncope and headaches.  Hematological:  Negative for adenopathy. Does not bruise/bleed easily.  Psychiatric/Behavioral:  Negative for decreased concentration and dysphoric mood. The patient is not nervous/anxious.        Objective:   Physical Exam Constitutional:      General: She is not in acute distress.    Appearance: Normal appearance. She is well-developed. She is obese. She is not ill-appearing or diaphoretic.  HENT:     Head: Normocephalic and atraumatic.     Mouth/Throat:     Mouth: Mucous membranes are moist.  Eyes:     General: No scleral icterus.    Conjunctiva/sclera: Conjunctivae normal.     Pupils: Pupils are equal, round, and reactive to light.  Neck:     Thyroid: No thyromegaly.     Vascular: No carotid bruit or JVD.  Cardiovascular:     Rate and Rhythm: Normal rate and regular rhythm.     Heart sounds: Normal heart sounds.     No gallop.  Pulmonary:     Effort: Pulmonary effort is normal. No respiratory distress.     Breath sounds: Normal breath sounds. No wheezing or rales.  Abdominal:     General: Abdomen is protuberant.  Bowel sounds are normal. There is no distension or abdominal bruit.     Palpations: Abdomen is soft. There is no shifting dullness, hepatomegaly, splenomegaly or pulsatile mass.     Tenderness: There is no abdominal tenderness. There is no right CVA tenderness or left CVA tenderness. Negative signs include Murphy's sign.  Musculoskeletal:     Cervical back: Normal range of motion and neck supple.     Right lower leg: No edema.     Left lower leg: No edema.  Lymphadenopathy:     Cervical: No cervical adenopathy.  Skin:    General: Skin is warm and dry.     Coloration: Skin is not pale.     Findings: No rash.  Neurological:     Mental Status: She is alert.     Coordination: Coordination normal.     Deep Tendon Reflexes: Reflexes are normal and symmetric. Reflexes normal.  Psychiatric:        Mood and Affect: Mood normal.           Assessment & Plan:   Problem List Items Addressed This Visit       Digestive   Nausea with vomiting - Primary   Episodic Similar to November (reviewed past records, labs and imaging)  In setting of  Known gallstones Large fatty meals Trial of rybelsus and elevated blood sugar  Dark urine with bilirubin  Past elevated LFts and bili   (reviewed last Ct with gallstones but no inflammation or ductal dilatation)  Stress/grief loss of husband on 4/13   Holding glp Lab today  Urine culture sent  Encouraged to hold off on fatty food and large  meals Has zofran for prn use  Of note symptoms are improved today  Call back and Er precautions noted in detail today        Relevant Orders   Basic metabolic panel with GFR   Hepatic function panel   CBC with Differential/Platelet   Lipase   Gallstones   I do wonder if this could be cause of 2 episodes of n/v with dark urine  Last liver labs 11/2022 noted high bili at 7.8  Both episodes happened after large fatty meals   Urine positive for bili today  Dark in color  N/v is improved  Reassuring  exam Labs now   Encouraged strongly to avoid fatty foods and large meals       Relevant Orders   Hepatic function panel   CBC with Differential/Platelet   Lipase     Genitourinary   History of UTI   ? If actually uti in November  Culture was neg       Relevant Orders   POCT Urinalysis Dipstick (Automated) (Completed)   Urine Culture     Other   Dark urine   Results for orders placed or performed in visit on 05/07/23  POCT Urinalysis Dipstick (Automated)   Collection Time: 05/07/23 12:57 PM  Result Value Ref Range   Color, UA Dark Yellow    Clarity, UA CLOUDY    Glucose, UA Negative Negative   Bilirubin, UA 1+    Ketones, UA NEGATIVE    Spec Grav, UA 1.015 1.010 - 1.025   Blood, UA POSITIVE    pH, UA 6.0 5.0 - 8.0   Protein, UA Positive (A) Negative   Urobilinogen, UA negative (A) 0.2 or 1.0 E.U./dL   Nitrite, UA POSITIVE    Leukocytes, UA Negative Negative    Past elevated bilirubin   Sent for culture  Encouraged good fluid intake       Relevant Orders   Urine Culture

## 2023-05-07 NOTE — Assessment & Plan Note (Signed)
?   If actually uti in November  Culture was neg

## 2023-05-07 NOTE — Assessment & Plan Note (Signed)
 I do wonder if this could be cause of 2 episodes of n/v with dark urine  Last liver labs 11/2022 noted high bili at 7.8  Both episodes happened after large fatty meals   Urine positive for bili today  Dark in color  N/v is improved  Reassuring exam Labs now   Encouraged strongly to avoid fatty foods and large meals

## 2023-05-07 NOTE — Assessment & Plan Note (Addendum)
 Episodic Similar to November (reviewed past records, labs and imaging)  In setting of  Known gallstones Large fatty meals Trial of rybelsus and elevated blood sugar  Dark urine with bilirubin  Past elevated LFts and bili   (reviewed last Ct with gallstones but no inflammation or ductal dilatation)  Stress/grief loss of husband on 4/13   Holding glp Lab today  Urine culture sent  Encouraged to hold off on fatty food and large meals Has zofran for prn use  Of note symptoms are improved today  Call back and Er precautions noted in detail today

## 2023-05-07 NOTE — Patient Instructions (Addendum)
 Urine culture today -we will reach out with result   Keep drinking lots of water  Cut back fatty foods (due to gallstones) and eat smaller amounts  If symptoms return let us  know   Glad you are feeling better  Stay off rybelsus for now   I will discuss with [facility] today   If symptoms suddenly worsen let us  know  If severe go to the ER

## 2023-05-08 ENCOUNTER — Encounter: Payer: Self-pay | Admitting: Family Medicine

## 2023-05-08 DIAGNOSIS — R112 Nausea with vomiting, unspecified: Secondary | ICD-10-CM

## 2023-05-08 DIAGNOSIS — K8020 Calculus of gallbladder without cholecystitis without obstruction: Secondary | ICD-10-CM

## 2023-05-08 LAB — HEPATIC FUNCTION PANEL
ALT: 126 U/L — ABNORMAL HIGH (ref 0–35)
AST: 86 U/L — ABNORMAL HIGH (ref 0–37)
Albumin: 3.8 g/dL (ref 3.5–5.2)
Alkaline Phosphatase: 111 U/L (ref 39–117)
Bilirubin, Direct: 3 mg/dL — ABNORMAL HIGH (ref 0.0–0.3)
Total Bilirubin: 4.8 mg/dL — ABNORMAL HIGH (ref 0.2–1.2)
Total Protein: 5.6 g/dL — ABNORMAL LOW (ref 6.0–8.3)

## 2023-05-08 LAB — BASIC METABOLIC PANEL WITH GFR
BUN: 17 mg/dL (ref 6–23)
CO2: 27 meq/L (ref 19–32)
Calcium: 9.2 mg/dL (ref 8.4–10.5)
Chloride: 101 meq/L (ref 96–112)
Creatinine, Ser: 1.13 mg/dL (ref 0.40–1.20)
GFR: 45.27 mL/min — ABNORMAL LOW (ref >60.00)
Glucose, Bld: 167 mg/dL — ABNORMAL HIGH (ref 70–99)
Potassium: 3.7 meq/L (ref 3.5–5.1)
Sodium: 139 meq/L (ref 135–145)

## 2023-05-08 LAB — CBC WITH DIFFERENTIAL/PLATELET
Basophils Absolute: 0 10*3/uL (ref 0.0–0.1)
Basophils Relative: 0.5 % (ref 0.0–3.0)
Eosinophils Absolute: 0.1 10*3/uL (ref 0.0–0.7)
Eosinophils Relative: 1.6 % (ref 0.0–5.0)
HCT: 39.3 % (ref 36.0–46.0)
Hemoglobin: 13.1 g/dL (ref 12.0–15.0)
Lymphocytes Relative: 13.4 % (ref 12.0–46.0)
Lymphs Abs: 0.8 10*3/uL (ref 0.7–4.0)
MCHC: 33.3 g/dL (ref 30.0–36.0)
MCV: 93.9 fl (ref 78.0–100.0)
Monocytes Absolute: 0.8 10*3/uL (ref 0.1–1.0)
Monocytes Relative: 14.8 % — ABNORMAL HIGH (ref 3.0–12.0)
Neutro Abs: 4 10*3/uL (ref 1.4–7.7)
Neutrophils Relative %: 69.7 % (ref 43.0–77.0)
Platelets: 112 10*3/uL — ABNORMAL LOW (ref 150.0–400.0)
RBC: 4.19 Mil/uL (ref 3.87–5.11)
RDW: 14.1 % (ref 11.5–15.5)
WBC: 5.7 10*3/uL (ref 4.0–10.5)

## 2023-05-08 LAB — LIPASE: Lipase: 31 U/L (ref 11.0–59.0)

## 2023-05-08 NOTE — Telephone Encounter (Signed)
 Courtney Stone, stat ultrasound ordered and pending for this patient.  Can you pass this along to the stat pool on teams?

## 2023-05-09 LAB — URINE CULTURE
MICRO NUMBER:: 16337541
SPECIMEN QUALITY:: ADEQUATE

## 2023-05-11 MED ORDER — CEPHALEXIN 500 MG PO CAPS: 500.0000 mg | ORAL_CAPSULE | Freq: Two times a day (BID) | ORAL | 0 refills | Status: DC

## 2023-05-11 NOTE — Addendum Note (Signed)
 Addended by: Deri Fleet A on: 05/11/2023 10:34 AM   Modules accepted: Orders

## 2023-05-15 ENCOUNTER — Ambulatory Visit
Admission: RE | Admit: 2023-05-15 | Discharge: 2023-05-15 | Disposition: A | Discharge status: Home / Self Care | Source: Ambulatory Visit | Attending: Primary Care | Admitting: Primary Care

## 2023-05-15 DIAGNOSIS — K760 Fatty (change of) liver, not elsewhere classified: Secondary | ICD-10-CM | POA: Diagnosis not present

## 2023-05-15 DIAGNOSIS — K8020 Calculus of gallbladder without cholecystitis without obstruction: Secondary | ICD-10-CM | POA: Insufficient documentation

## 2023-05-15 DIAGNOSIS — R112 Nausea with vomiting, unspecified: Secondary | ICD-10-CM | POA: Insufficient documentation

## 2023-06-17 DIAGNOSIS — H43813 Vitreous degeneration, bilateral: Secondary | ICD-10-CM | POA: Diagnosis not present

## 2023-06-17 DIAGNOSIS — Z0100 Encounter for examination of eyes and vision without abnormal findings: Secondary | ICD-10-CM | POA: Diagnosis not present

## 2023-06-17 DIAGNOSIS — H26492 Other secondary cataract, left eye: Secondary | ICD-10-CM | POA: Diagnosis not present

## 2023-06-17 DIAGNOSIS — Z961 Presence of intraocular lens: Secondary | ICD-10-CM | POA: Diagnosis not present

## 2023-06-17 DIAGNOSIS — E119 Type 2 diabetes mellitus without complications: Secondary | ICD-10-CM | POA: Diagnosis not present

## 2023-06-17 LAB — HM DIABETES EYE EXAM

## 2023-06-19 ENCOUNTER — Other Ambulatory Visit: Payer: Self-pay

## 2023-06-19 DIAGNOSIS — F4323 Adjustment disorder with mixed anxiety and depressed mood: Secondary | ICD-10-CM

## 2023-06-19 MED ORDER — FLUOXETINE HCL 10 MG PO TABS: 10.0000 mg | ORAL_TABLET | Freq: Every day | ORAL | 2 refills | Status: DC

## 2023-06-23 DIAGNOSIS — H26492 Other secondary cataract, left eye: Secondary | ICD-10-CM | POA: Diagnosis not present

## 2023-07-07 ENCOUNTER — Other Ambulatory Visit: Payer: Self-pay

## 2023-07-07 DIAGNOSIS — E119 Type 2 diabetes mellitus without complications: Secondary | ICD-10-CM

## 2023-07-07 MED ORDER — METFORMIN HCL ER 500 MG PO TB24: ORAL_TABLET | ORAL | 0 refills | Status: DC

## 2023-07-10 IMAGING — DX DG KNEE COMPLETE 4+V*R*
4 series · 4 of 4 positions shown · non-contrast
Comparison: None.

CLINICAL DATA: Chronic bilateral knee pain. Pain for 10 years,
worse over the last month. Progressing chronic bilateral knee pain,
right greater than left, decreased quality of life given pain.

EXAM:
RIGHT KNEE - COMPLETE 4+ VIEW

[knee ap]
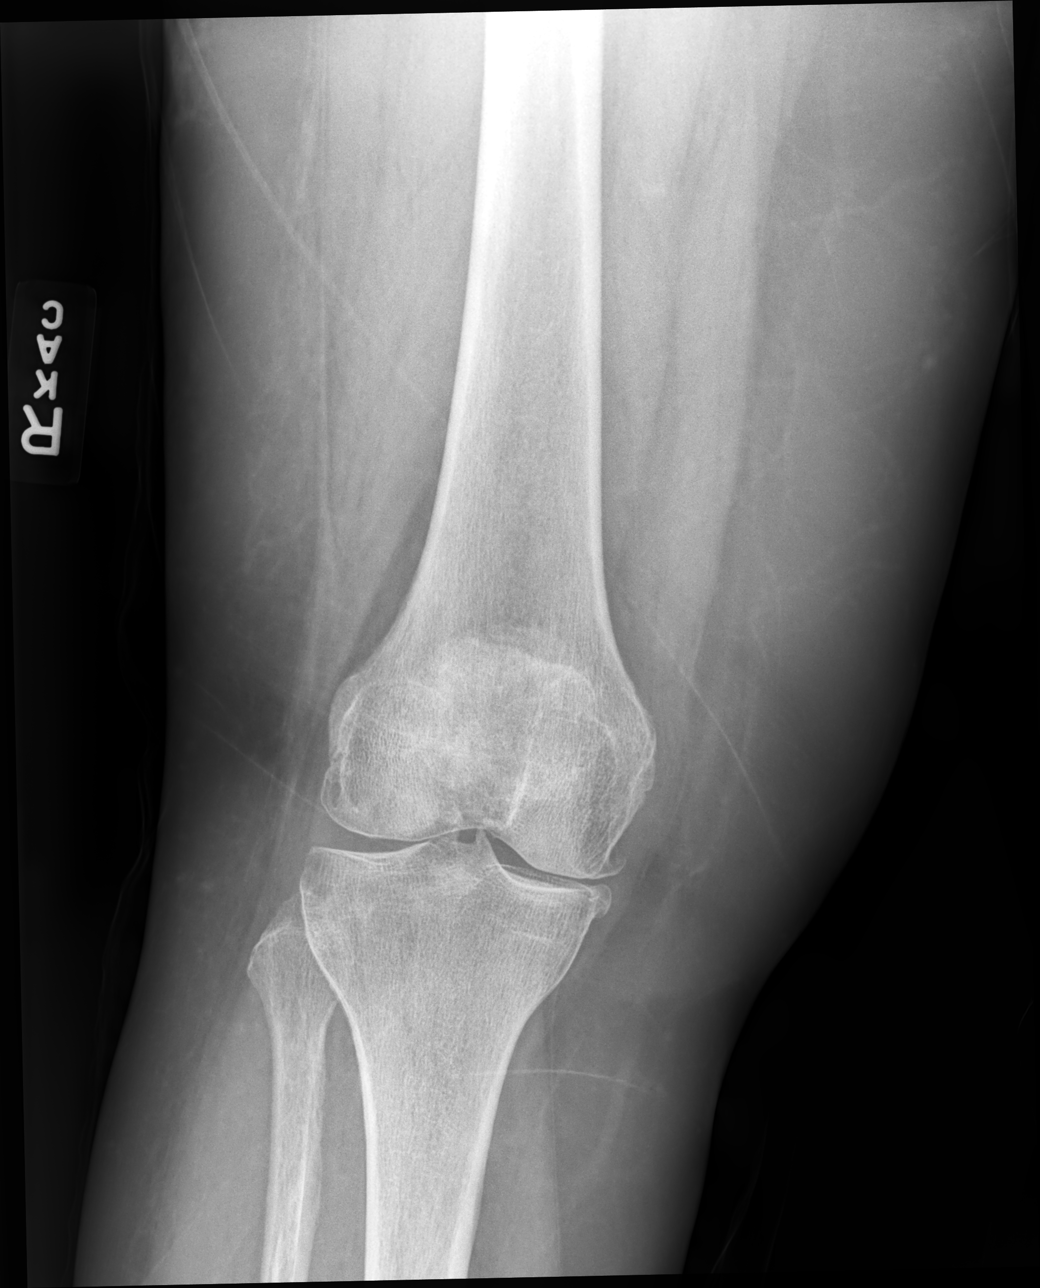

[knee mlo (1 of 2)]
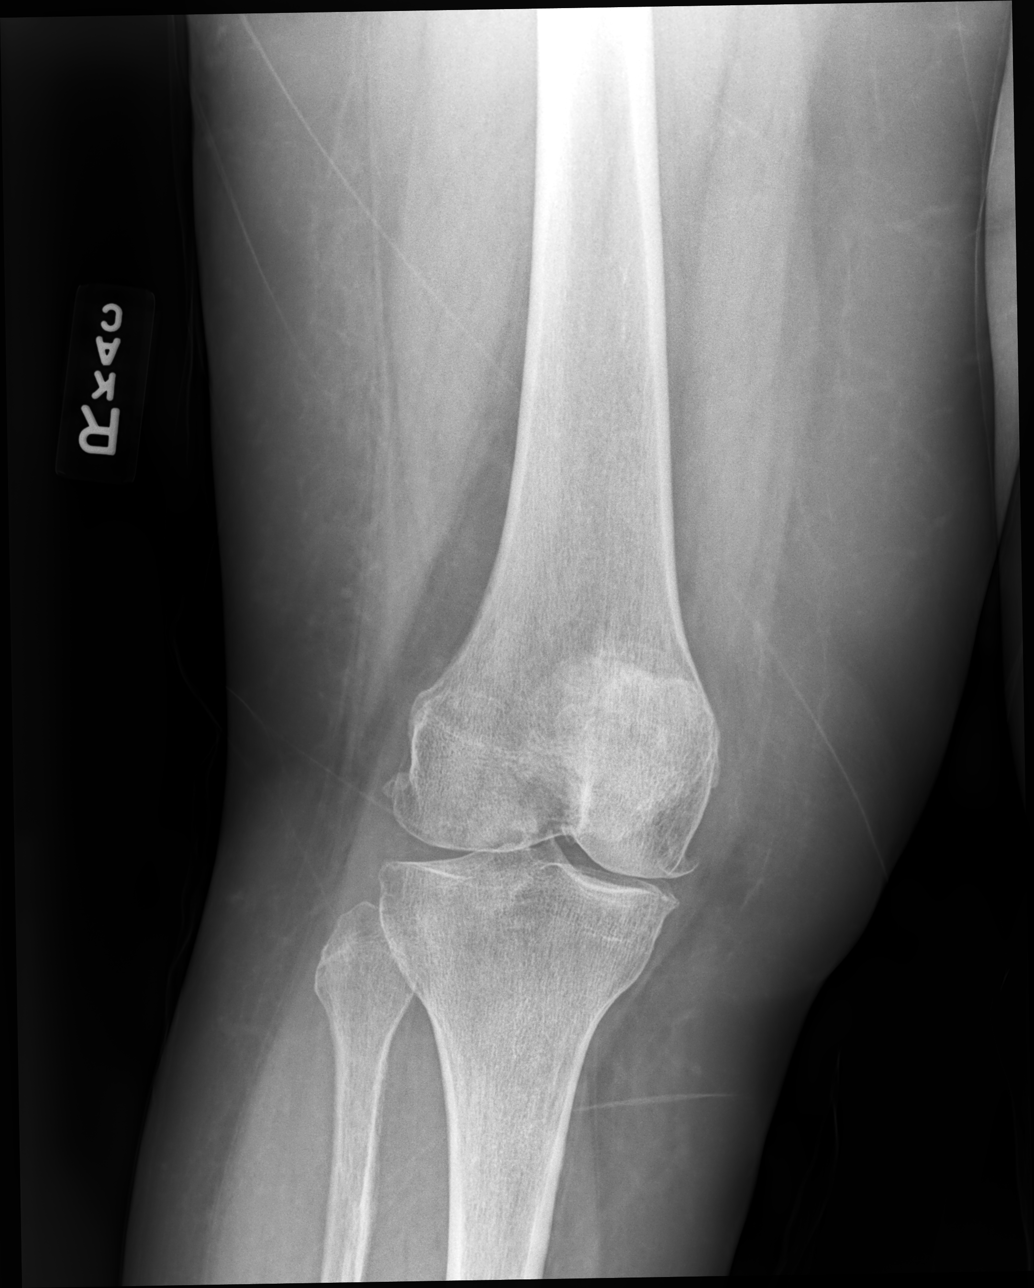

[knee mlo (2 of 2)]
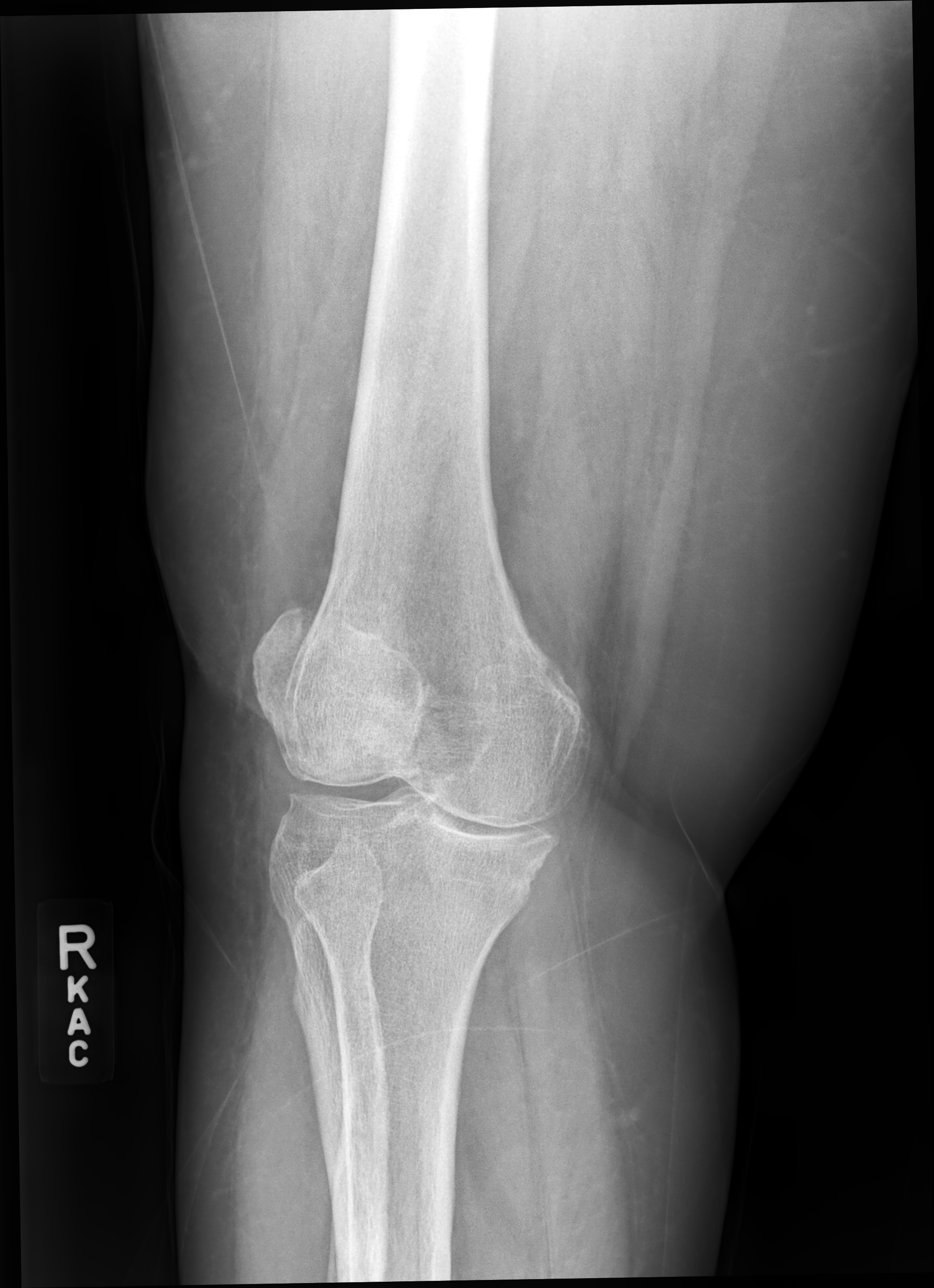

[knee lat]
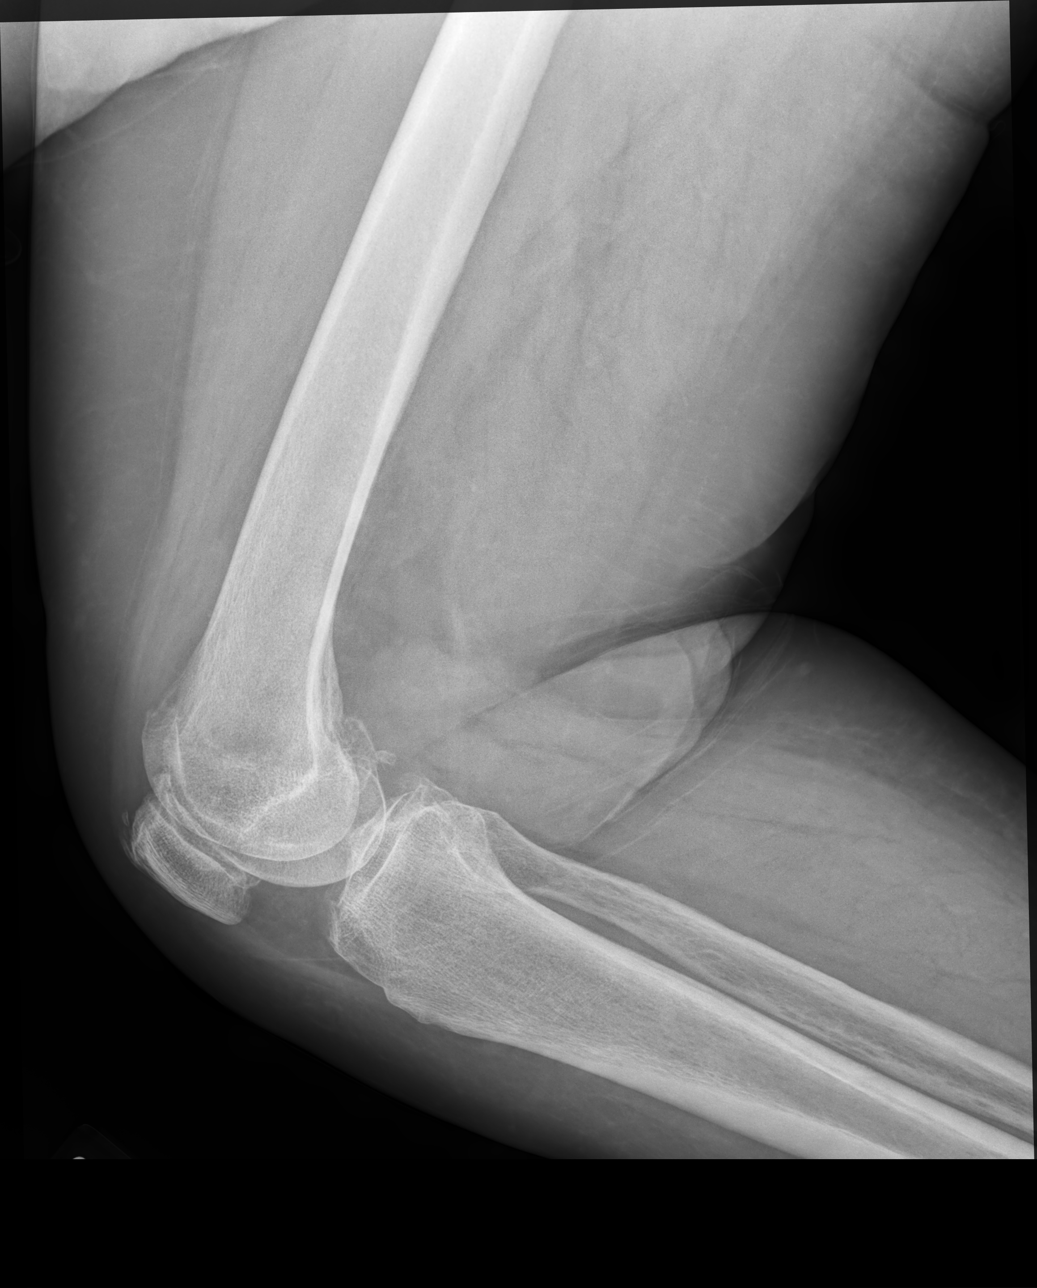

[4 of 4 positions shown; findings below may reference images not displayed]

FINDINGS: Mild medial tibiofemoral compartment joint space narrowing. Mild
patellofemoral and moderate medial tibiofemoral spurring as well as
spurring of the tibial spines. Minimal quadriceps tendon
enthesophyte. No fracture, erosion, bony destruction or focal bone
lesion. Diminutive knee joint effusion. Unremarkable soft tissues.
IMPRESSION: Osteoarthritis, moderate in the medial tibiofemoral compartment and
mild in the patellofemoral compartment. Small joint effusion.

## 2023-07-10 NOTE — Telephone Encounter (Signed)
 Patient picked up form

## 2023-07-31 ENCOUNTER — Ambulatory Visit: Admitting: Primary Care

## 2023-07-31 ENCOUNTER — Ambulatory Visit: Payer: Self-pay | Admitting: Primary Care

## 2023-07-31 ENCOUNTER — Encounter: Payer: Self-pay | Admitting: Primary Care

## 2023-07-31 VITALS — BP 124/82 | HR 71 | Temp 97.2°F | Ht 67.0 in | Wt 242.0 lb

## 2023-07-31 DIAGNOSIS — Z7984 Long term (current) use of oral hypoglycemic drugs: Secondary | ICD-10-CM | POA: Diagnosis not present

## 2023-07-31 DIAGNOSIS — E1165 Type 2 diabetes mellitus with hyperglycemia: Secondary | ICD-10-CM

## 2023-07-31 DIAGNOSIS — E119 Type 2 diabetes mellitus without complications: Secondary | ICD-10-CM

## 2023-07-31 LAB — POCT GLYCOSYLATED HEMOGLOBIN (HGB A1C): Hemoglobin A1C: 9 % — AB (ref 4.0–5.6)

## 2023-07-31 MED ORDER — TIRZEPATIDE 2.5 MG/0.5ML ~~LOC~~ SOAJ: 2.5000 mg | SUBCUTANEOUS | 0 refills | Status: DC

## 2023-07-31 MED ORDER — METFORMIN HCL ER 500 MG PO TB24: 500.0000 mg | ORAL_TABLET | Freq: Two times a day (BID) | ORAL | 1 refills | Status: DC

## 2023-07-31 NOTE — Patient Instructions (Signed)
 Stop taking Januvia  100 mg for diabetes.  Reduce metformin  to 1 pill twice daily or 2 pills once daily with food.  Continue glipizide  XL 10 mg daily for diabetes.  Start tirzepitide (Mounjaro ) for diabetes/weight loss. Start by injecting 2.5 mg into the skin once weekly for 4 weeks, then increase to 5 mg once weekly thereafter. Please notify me once you've used your last 2.5 mg pen so that I can prescribe the next dose.   Please schedule a follow up visit for 3 months.  It was a pleasure to see you today!

## 2023-07-31 NOTE — Progress Notes (Signed)
 Subjective:    Patient ID: Courtney Stone, female    DOB: 1941-01-18, 83 y.o.   MRN: [id number]  HPI  Courtney Stone is a very pleasant 83 y.o. female with a history of type 2 diabetes, hypertension, CKD who presents today for follow-up of diabetes.  Current medications include: Metformin  ER 1000 mg twice daily, Januvia  100 mg daily, glipizide  XL 10 mg daily. She was initiated on Rybelsus  in April 2025 but experienced nausea and vomiting after eating too much.  Over the last year she has experienced frequent bowel movements, 2-3 bowel movements per day with stools becoming more loose throughout the day.  She has been dealing with for the last year but is ready for resolve.  She is checking her blood glucose once weekly and is getting readings of high 100s to low 200s  Last A1C: 9.3 in April 2025,  Last Eye Exam: UTD Last Foot Exam: UTD Pneumonia Vaccination: 2020 Urine Microalbumin: UTD Statin: None   Dietary changes since last visit: She endorses large portion sizes and too frequently. Her diet consists of protein, vegetable, and starch. She mostly drinks water. Limited sweets.    Exercise: None    Review of Systems  Respiratory:  Negative for shortness of breath.   Cardiovascular:  Negative for chest pain.  Neurological:  Negative for dizziness and numbness.         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    2 children, 2 grand children.   Lives in [place].   Retired, once worked as a Runner, broadcasting/film/video.    Enjoys spending time with family, reading.    Social Drivers of Corporate investment banker Strain: Low Risk  (05/06/2023)   Overall Financial Resource Strain (CARDIA)    Difficulty of Paying Living Expenses: Not hard at all  Food Insecurity: No Food Insecurity (05/06/2023)   Hunger Vital Sign    Worried About Running Out of Food in the Last Year: Never true    Ran Out of Food in the Last Year: Never true  Transportation Needs: No Transportation Needs (05/06/2023)   PRAPARE - Administrator, Civil Service (Medical): No    Lack of Transportation (Non-Medical): No  Physical Activity: Unknown (05/06/2023)   Exercise Vital Sign    Days of Exercise per Week: 0 days    Minutes of Exercise per Session: Not on file  Stress: Stress Concern Present (05/06/2023)   [employer] of Occupational Health - Occupational Stress Questionnaire    Feeling of Stress : To some extent  Social Connections: Moderately  Isolated (05/06/2023)   Social Connection and Isolation Panel    Frequency of Communication with Friends and Family: Once a week    Frequency of Social Gatherings with Friends and Family: Once a week    Attends Religious Services: More than 4 times per year    Active Member of [employer] or Organizations: Yes    Attends Banker Meetings: 1 to 4 times per year    Marital Status: Widowed  Intimate Partner Violence: Not At Risk (04/16/2021)   Humiliation, Afraid, Rape, and Kick questionnaire    Fear of Current or Ex-Partner: No    Emotionally Abused: No    Physically Abused: No    Sexually Abused: No    Past Surgical History:  Procedure Laterality Date   ABDOMINAL HYSTERECTOMY   1976   complete   BREAST BIOPSY Right    neg   BREAST SURGERY  1968   benign   CATARACT EXTRACTION, BILATERAL  2016   [facility]    NECK SURGERY  1993   ?bone spur removal   URETHRA SURGERY     WRIST FRACTURE SURGERY      Family History  Problem Relation Age of Onset   Cancer Mother        lung   Cancer Father    COPD Father    Diabetes Sister    Hypertension Sister    Breast cancer Paternal Grandmother 37    Allergies  Allergen Reactions   Adhesive [Tape]    Sulfa Antibiotics     Headache   Latex Rash    Current Outpatient Medications on File Prior to Visit  Medication Sig Dispense Refill   Fluocinolone Acetonide 0.01 % OIL      FLUoxetine  (PROZAC ) 10 MG tablet Take 1 tablet (10 mg total) by mouth daily. for anxiety and depression. 90 tablet 2   fluticasone  (FLONASE ) 50 MCG/ACT nasal spray Place 1 spray into both nostrils 2 (two) times daily. 48 g 0   glipiZIDE  (GLUCOTROL  XL) 10 MG 24 hr tablet TAKE 1 TABLET BY MOUTH DAILY WITH BREAKFAST FOR DIABETES 90 tablet 1   glucose blood test strip Use to check blood sugar up to 3 times daily 100 each 5   lisinopril  (ZESTRIL ) 10 MG tablet Take 1 tablet (10 mg total) by mouth daily. for blood pressure. 90 tablet 3   No current facility-administered medications on file prior to visit.    BP 124/82   Pulse 71   Temp (!) 97.2 F (36.2 C) (Temporal)   Ht 5' 7 (1.702 m)   Wt 242 lb (109.8 kg)   SpO2 98%   BMI 37.90 kg/m  Objective:   Physical Exam Cardiovascular:     Rate and Rhythm: Normal rate and regular rhythm.  Pulmonary:     Effort: Pulmonary effort is normal.     Breath sounds: Normal breath sounds.  Musculoskeletal:     Cervical back: Neck supple.  Skin:    General: Skin is warm and dry.  Neurological:     Mental Status: She is alert and oriented to person, place, and time.  Psychiatric:        Mood and Affect: Mood normal.           Assessment & Plan:  Type 2 diabetes mellitus with  hyperglycemia, without long-term current use of insulin (HCC) Assessment & Plan: Slightly improved but remains uncontrolled with A1c of 9.0 today.  She is also experiencing side effects from metformin . We had  a long discussion about GLP-1 agonist treatment, she does agree to try.  We discussed instructions for use and potential side effects.  Reduce metformin  ER to 1000 mg once daily. Continue glipizide  XL 10 mg daily. Stop Januvia  100 mg daily.  Start tirzepitide (Mounjaro ) for diabetes/weight loss. Start by injecting 2.5 mg into the skin once weekly for 4 weeks, then increase to 5 mg once weekly thereafter.   Follow-up in 3 months.  Orders: -     POCT glycosylated hemoglobin (Hb A1C) -     Tirzepatide ; Inject 2.5 mg into the skin once a week. for diabetes.  Dispense: 2 mL; Refill: 0  Controlled type 2 diabetes mellitus without complication, without long-term current use of insulin (HCC) -     metFORMIN  HCl ER; Take 1 tablet (500 mg total) by mouth 2 (two) times daily with a meal. for diabetes.  Dispense: 180 tablet; Refill: 1        Comer MARLA Gaskins, NP

## 2023-07-31 NOTE — Assessment & Plan Note (Addendum)
 Slightly improved but remains uncontrolled with A1c of 9.0 today.  She is also experiencing side effects from metformin . We had a long discussion about GLP-1 agonist treatment, she does agree to try.  We discussed instructions for use and potential side effects.  Reduce metformin  ER to 1000 mg once daily. Continue glipizide  XL 10 mg daily. Stop Januvia  100 mg daily.  Start tirzepitide (Mounjaro ) for diabetes/weight loss. Start by injecting 2.5 mg into the skin once weekly for 4 weeks, then increase to 5 mg once weekly thereafter.   Follow-up in 3 months.

## 2023-08-14 ENCOUNTER — Other Ambulatory Visit: Payer: Self-pay

## 2023-08-14 DIAGNOSIS — E119 Type 2 diabetes mellitus without complications: Secondary | ICD-10-CM

## 2023-08-14 DIAGNOSIS — I10 Essential (primary) hypertension: Secondary | ICD-10-CM

## 2023-08-14 MED ORDER — GLIPIZIDE ER 10 MG PO TB24: ORAL_TABLET | ORAL | 1 refills | Status: DC

## 2023-08-14 MED ORDER — LISINOPRIL 10 MG PO TABS: 10.0000 mg | ORAL_TABLET | Freq: Every day | ORAL | 1 refills | Status: DC

## 2023-08-23 DIAGNOSIS — E1165 Type 2 diabetes mellitus with hyperglycemia: Secondary | ICD-10-CM

## 2023-08-24 MED ORDER — TIRZEPATIDE 5 MG/0.5ML ~~LOC~~ SOAJ: 5.0000 mg | SUBCUTANEOUS | 0 refills | Status: DC

## 2023-09-25 DIAGNOSIS — H6063 Unspecified chronic otitis externa, bilateral: Secondary | ICD-10-CM | POA: Diagnosis not present

## 2023-09-25 DIAGNOSIS — H6121 Impacted cerumen, right ear: Secondary | ICD-10-CM | POA: Diagnosis not present

## 2023-10-23 ENCOUNTER — Ambulatory Visit

## 2023-10-23 VITALS — BP 126/78 | Ht 67.0 in | Wt 242.0 lb

## 2023-10-23 DIAGNOSIS — Z0000 Encounter for general adult medical examination without abnormal findings: Secondary | ICD-10-CM

## 2023-10-23 NOTE — Progress Notes (Signed)
 Please attest and cosign this visit due to patients primary care provider not being in the office at the time the visit was completed.    Subjective:   Courtney Stone is a 83 y.o. who presents for a Medicare Wellness preventive visit.  As a reminder, Annual Wellness Visits don't include a physical exam, and some assessments may be limited, especially if this visit is performed virtually. We may recommend an in-person follow-up visit with your provider if needed.  Visit Complete: In person  Persons Participating in Visit: Patient.  AWV Questionnaire: Yes: Patient Medicare AWV questionnaire was completed by the patient on 10/16/23; I have confirmed that all information answered by patient is correct and no changes since this date.  Cardiac Risk Factors include: advanced age (>59men, >41 women);obesity (BMI >30kg/m2);diabetes mellitus;hypertension;sedentary lifestyle     Objective:    Today's Vitals   10/23/23 1505  BP: 126/78  Weight: 242 lb (109.8 kg)  Height: 5' 7 (1.702 m)   Body mass index is 37.9 kg/m.     10/23/2023    3:16 PM 04/16/2021    2:52 PM 09/16/2017    1:47 PM 09/05/2015    9:56 AM  Advanced Directives  Does Patient Have a Medical Advance Directive? Yes Yes Yes  Yes   Type of Estate agent of [place];Living will Healthcare Power of [employer] Power of [employer] Power of [place];Living will   Does patient want to make changes to medical advance directive?    No - Patient declined   Copy of Healthcare Power of Attorney in Chart? No - copy requested No - copy requested No - copy requested       Data saved with a previous flowsheet row definition    Current Medications (verified) Outpatient Encounter Medications as of 10/23/2023  Medication Sig   Fluocinolone Acetonide 0.01 % OIL    FLUoxetine  (PROZAC ) 10 MG tablet Take 1 tablet (10 mg total) by mouth daily. for anxiety and depression.   fluticasone  (FLONASE ) 50  MCG/ACT nasal spray Place 1 spray into both nostrils 2 (two) times daily.   glipiZIDE  (GLUCOTROL  XL) 10 MG 24 hr tablet TAKE 1 TABLET BY MOUTH DAILY WITH BREAKFAST FOR DIABETES   glucose blood test strip Use to check blood sugar up to 3 times daily   lisinopril  (ZESTRIL ) 10 MG tablet Take 1 tablet (10 mg total) by mouth daily. for blood pressure.   metFORMIN  (GLUCOPHAGE -XR) 500 MG 24 hr tablet Take 1 tablet (500 mg total) by mouth 2 (two) times daily with a meal. for diabetes.   tirzepatide  (MOUNJARO ) 5 MG/0.5ML Pen Inject 5 mg into the skin once a week. for diabetes.   No facility-administered encounter medications on file as of 10/23/2023.    Allergies (verified) Adhesive [tape], Sulfa antibiotics, and Latex   History: Past Medical History:  Diagnosis Date   Allergy 2000   Anxiety and depression    Diabetes mellitus    HTN (hypertension)    120/80's at home   Multinodular goiter 11/15/2016   Nausea and vomiting 12/18/2022   Sleep apnea 2016   Thyroid  disorder    Past Surgical History:  Procedure Laterality Date   ABDOMINAL HYSTERECTOMY  1976   complete   BREAST BIOPSY Right    neg   BREAST SURGERY  1968   benign   CATARACT EXTRACTION, BILATERAL  2016   [facility]    EYE SURGERY  2016   FRACTURE SURGERY  2010  NECK SURGERY  1993   ?bone spur removal   URETHRA SURGERY     WRIST FRACTURE SURGERY     Family History  Problem Relation Age of Onset   Cancer Mother        lung   Cancer Father    COPD Father    Diabetes Sister    Hypertension Sister    Breast cancer Paternal Grandmother 33   Diabetes Sister    Hypertension Sister    Social History   Socioeconomic History   Marital status: Married    Spouse name: Charles   Number of children: 2   Years of education: Not on file   Highest education level: Master's degree (e.g., MA, MS, MEng, MEd, MSW, MBA)  Occupational History   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    2 children, 2 grand children.   Lives in [place].   Retired, once worked as a Runner, broadcasting/film/video.    Enjoys spending time with family, reading.    Social Drivers of Corporate investment banker Strain: Low Risk  (10/16/2023)   Overall Financial Resource Strain (CARDIA)    Difficulty of Paying Living Expenses: Not hard at all  Food Insecurity: No Food Insecurity (10/16/2023)   Hunger Vital Sign    Worried About Running Out of Food in the Last Year: Never true    Ran Out of Food in the Last Year: Never true  Transportation Needs: No Transportation Needs (10/16/2023)   PRAPARE - Administrator, Civil Service (Medical): No    Lack of Transportation (Non-Medical): No  Physical Activity: Inactive (10/23/2023)   Exercise Vital Sign    Days of Exercise per Week: 0 days    Minutes of Exercise per Session: 0 min  Stress: No Stress Concern Present (10/16/2023)   [employer] of Occupational Health - Occupational Stress Questionnaire    Feeling of Stress: Only a little  Social Connections: Moderately Isolated (10/16/2023)   Social Connection and Isolation Panel    Frequency of Communication with Friends and Family: Once a week    Frequency of Social Gatherings with Friends and Family: Once a week    Attends Religious Services: More than 4 times per year    Active Member of [employer] or Organizations: Yes    Attends Banker Meetings: More than 4 times per year    Marital Status: Widowed    Tobacco Counseling Counseling given: Not Answered    Clinical Intake:  Pre-visit preparation completed: Yes  Pain : No/denies pain     BMI - recorded: 37.5 Nutritional Status: BMI > 30  Obese Nutritional Risks:  None Diabetes: Yes CBG done?: Yes (BS run about 140 in am)  Lab Results  Component Value Date   HGBA1C 9.0 (A) 07/31/2023   HGBA1C 9.3 (A) 04/30/2023   HGBA1C 7.7 (A) 10/30/2022     How often do you need to have someone help you when you read instructions, pamphlets, or other written materials from your doctor or pharmacy?: 1 - Never  Interpreter Needed?: No  Comments: pt daughter lives with pt Information entered by :: B.Joniah Bednarski,LPN  Activities of Daily Living     10/16/2023   11:58 AM  In your present state of health, do you have any difficulty performing the following activities:  Hearing? 0  Vision? 0  Difficulty concentrating or making decisions? 0  Walking or climbing stairs? 1  Dressing or bathing? 0  Doing errands, shopping? 0  Preparing Food and eating ? N  Using the Toilet? N  In the past six months, have you accidently leaked urine? Y  Do you have problems with loss of bowel control? N  Managing your Medications? N  Managing your Finances? N  Housekeeping or managing your Housekeeping? N    Patient Care Team: Gretta Comer POUR, NP as PCP - General (Internal Medicine) Dorise Lynwood Marsa DOUGLAS, MD as Referring Physician (Ophthalmology) Fate Morna SAILOR, [facility] (Inactive) as Pharmacist (Pharmacist) Mittie Gaskin, MD as Referring Physician (Ophthalmology)  I have updated your Care Teams any recent Medical Services you may have received from other providers in the past year.     Assessment:   This is a routine wellness examination for [place].  Hearing/Vision screen Hearing Screening - Comments:: Patient denies any hearing difficulties.   Vision Screening - Comments:: Pt says their vision is good with glasses Dr  [place]ral   Goals Addressed             This Visit's Progress    COMPLETED: Increase physical activity   Not on track    04/16/2021 - I will get back to doing chair exercises for 20 minutes 3 days per week and try to walk  some daily     Patient Stated       I want to keep my weight down and eat healthy       Depression Screen     10/23/2023    3:14 PM 07/31/2023   11:47 AM 04/30/2023   11:58 AM 04/30/2022    9:50 AM 04/18/2022    2:17 PM 04/25/2021    9:22 AM 04/16/2021    2:51 PM  PHQ 2/9 Scores  PHQ - 2 Score 0 2 2 2  0 0 1  PHQ- 9 Score  10 8 7 1       Fall Risk     10/16/2023   11:58 AM 07/31/2023   11:47 AM 04/30/2023   11:54 AM 10/30/2022    8:24 AM 05/22/2022    8:18 AM  Fall Risk   Falls in the past year? 0 0 0 0 0  Number falls in past yr: 0 0 0 0 0  Injury with Fall? 0 0 0 0 0  Risk for fall due to : No Fall Risks No Fall Risks No Fall Risks No Fall Risks No Fall Risks  Follow up Falls prevention discussed;Education provided Falls evaluation completed Falls evaluation completed Falls evaluation completed Falls evaluation completed    MEDICARE RISK AT HOME:  Medicare Risk at Home Any stairs in or around the home?: (Patient-Rptd) Yes If so, are there any without handrails?: (Patient-Rptd) No Home free of loose throw rugs in walkways, pet beds, electrical cords, etc?: (Patient-Rptd) Yes Adequate lighting in your home to reduce risk of falls?: (Patient-Rptd) Yes Life alert?: (Patient-Rptd) No Use of a cane, walker or w/c?: (Patient-Rptd) Yes Grab bars in the bathroom?: (Patient-Rptd) Yes Shower chair or bench in shower?: (Patient-Rptd) No Elevated toilet seat or a handicapped toilet?: (Patient-Rptd) No  TIMED UP AND GO:  Was the test performed?  No  Cognitive Function: 6CIT completed  09/16/2017    1:48 PM 09/05/2015   10:02 AM  MMSE - Mini Mental State Exam  Orientation to time 5 5   Orientation to Place 5 5   Registration 3 3   Attention/ Calculation 0 0   Recall 3 3   Language- name 2 objects 0 0   Language- repeat 1 1  Language- follow 3 step command 3 3   Language- read & follow direction 0 0   Write a sentence 0 0   Copy design 0 0   Total score 20 20      Data saved  with a previous flowsheet row definition        10/23/2023    3:18 PM  6CIT Screen  What Year? 0 points  What month? 0 points  What time? 0 points  Count back from 20 0 points  Months in reverse 0 points  Repeat phrase 0 points  Total Score 0 points    Immunizations Immunization History  Administered Date(s) Administered   Fluad Quad(high Dose 65+) 10/15/2018, 11/01/2019, 10/13/2020, 11/15/2021   Fluad Trivalent(High Dose 65+) 10/30/2022   INFLUENZA, HIGH DOSE SEASONAL PF 11/15/2021   Influenza Split 09/30/2011   Influenza,inj,Quad PF,6+ Mos 10/21/2012, 10/16/2013, 10/26/2014   Influenza-Unspecified 11/05/2015, 10/28/2016   Moderna Covid-19 Fall Seasonal Vaccine 51yrs & older 11/05/2022   PFIZER(Purple Top)SARS-COV-2 Vaccination 02/09/2019, 03/06/2019, 12/03/2019, 11/21/2020   Pfizer Covid-19 Vaccine Bivalent Booster 2yrs & up 12/08/2020   Pfizer(Comirnaty)Fall Seasonal Vaccine 12 years and older 11/15/2021   Pneumococcal Conjugate-13 05/14/2013   Pneumococcal Polysaccharide-23 06/20/2009, 03/19/2018   Zoster, Live 06/20/2009    Screening Tests Health Maintenance  Topic Date Due   Influenza Vaccine  08/22/2023   COVID-19 Vaccine (7 - Pfizer risk 2024-25 season) 09/22/2023   Zoster Vaccines- Shingrix (1 of 2) 10/31/2023 (Originally 09/19/1959)   FOOT EXAM  10/30/2023   HEMOGLOBIN A1C  01/31/2024   Diabetic kidney evaluation - Urine ACR  04/29/2024   Diabetic kidney evaluation - eGFR measurement  05/06/2024   OPHTHALMOLOGY EXAM  06/16/2024   Medicare Annual Wellness (AWV)  10/22/2024   Pneumococcal Vaccine: 50+ Years  Completed   DEXA SCAN  Completed   HPV VACCINES  Aged Out   Meningococcal B Vaccine  Aged Out   DTaP/Tdap/Td  Discontinued   Hepatitis C Screening  Discontinued    Health Maintenance Items Addressed:   Additional Screening:  Vision Screening: Recommended annual ophthalmology exams for early detection of glaucoma and other disorders of the eye. Is  the patient up to date with their annual eye exam?  Yes  Who is the provider or what is the name of the office in which the patient attends annual eye exams? Dr Mittie  Dental Screening: Recommended annual dental exams for proper oral hygiene  Community Resource Referral / Chronic Care Management: CRR required this visit?  No   CCM required this visit?  No   Plan:    I have personally reviewed and noted the following in the patient's chart:   Medical and social history Use of alcohol, tobacco or illicit drugs  Current medications and supplements including opioid prescriptions. Patient is not currently taking opioid prescriptions. Functional ability and status Nutritional status Physical activity Advanced directives List of other physicians Hospitalizations, surgeries, and ER visits in previous 12 months Vitals Screenings to include cognitive, depression, and falls Referrals and appointments  In addition, I have reviewed and discussed with patient certain preventive protocols, quality metrics, and best practice recommendations. A  written personalized care plan for preventive services as well as general preventive health recommendations were provided to patient.   Erminio LITTIE Saris, LPN   89/07/7972   After Visit Summary: (In Person-Declined) Patient declined AVS at this time.  Notes: Nothing significant to report at this time.

## 2023-10-23 NOTE — Patient Instructions (Signed)
 Ms. Capek,  Thank you for taking the time for your Medicare Wellness Visit. I appreciate your continued commitment to your health goals. Please review the care plan we discussed, and feel free to reach out if I can assist you further.  Medicare recommends these wellness visits once per year to help you and your care team stay ahead of potential health issues. These visits are designed to focus on prevention, allowing your provider to concentrate on managing your acute and chronic conditions during your regular appointments.  Please note that Annual Wellness Visits do not include a physical exam. Some assessments may be limited, especially if the visit was conducted virtually. If needed, we may recommend a separate in-person follow-up with your provider.  Ongoing Care Seeing your primary care provider every 3 to 6 months helps us  monitor your health and provide consistent, personalized care.   Referrals If a referral was made during today's visit and you haven't received any updates within two weeks, please contact the referred provider directly to check on the status.  Recommended Screenings:  Health Maintenance  Topic Date Due   Flu Shot  08/22/2023   COVID-19 Vaccine (7 - Pfizer risk 2024-25 season) 09/22/2023   Zoster (Shingles) Vaccine (1 of 2) 10/31/2023*   Complete foot exam   10/30/2023   Hemoglobin A1C  01/31/2024   Yearly kidney health urinalysis for diabetes  04/29/2024   Yearly kidney function blood test for diabetes  05/06/2024   Eye exam for diabetics  06/16/2024   Medicare Annual Wellness Visit  10/22/2024   Pneumococcal Vaccine for age over 13  Completed   DEXA scan (bone density measurement)  Completed   HPV Vaccine  Aged Out   Meningitis B Vaccine  Aged Out   DTaP/Tdap/Td vaccine  Discontinued   Hepatitis C Screening  Discontinued  *Topic was postponed. The date shown is not the original due date.       04/16/2021    2:52 PM  Advanced Directives  Does Patient  Have a Medical Advance Directive? Yes  Type of Advance Directive Healthcare Power of Attorney  Copy of Healthcare Power of Attorney in Chart? No - copy requested   Advance Care Planning is important because it: Ensures you receive medical care that aligns with your values, goals, and preferences. Provides guidance to your family and loved ones, reducing the emotional burden of decision-making during critical moments.  Vision: Annual vision screenings are recommended for early detection of glaucoma, cataracts, and diabetic retinopathy. These exams can also reveal signs of chronic conditions such as diabetes and high blood pressure.  Dental: Annual dental screenings help detect early signs of oral cancer, gum disease, and other conditions linked to overall health, including heart disease and diabetes.  Please see the attached documents for additional preventive care recommendations.

## 2023-10-31 ENCOUNTER — Ambulatory Visit: Payer: Self-pay | Admitting: Primary Care

## 2023-10-31 ENCOUNTER — Encounter: Payer: Self-pay | Admitting: Primary Care

## 2023-10-31 ENCOUNTER — Ambulatory Visit: Admitting: Primary Care

## 2023-10-31 VITALS — BP 118/68 | HR 67 | Temp 97.7°F | Ht 67.0 in | Wt 235.0 lb

## 2023-10-31 DIAGNOSIS — Z7985 Long-term (current) use of injectable non-insulin antidiabetic drugs: Secondary | ICD-10-CM | POA: Diagnosis not present

## 2023-10-31 DIAGNOSIS — Z7984 Long term (current) use of oral hypoglycemic drugs: Secondary | ICD-10-CM | POA: Diagnosis not present

## 2023-10-31 DIAGNOSIS — E1165 Type 2 diabetes mellitus with hyperglycemia: Secondary | ICD-10-CM

## 2023-10-31 LAB — POCT GLYCOSYLATED HEMOGLOBIN (HGB A1C): Hemoglobin A1C: 7.7 % — AB (ref 4.0–5.6)

## 2023-10-31 MED ORDER — MOUNJARO 7.5 MG/0.5ML ~~LOC~~ SOAJ: 7.5000 mg | SUBCUTANEOUS | 1 refills | Status: DC

## 2023-10-31 NOTE — Patient Instructions (Signed)
 Stop taking metformin  for diabetes.  Continue glipizide  for diabetes.  We increased the dose of your Mounjaro  to 7.5 mg weekly.  Please schedule a follow up visit for 3 months.  See the written copy of this report in the patient's paper medical record.  These results did not interface directly into the electronic medical record and are summarized here.

## 2023-10-31 NOTE — Progress Notes (Signed)
 [facility] here for left and shares  Subjective:    Patient ID: Courtney Stone, female    DOB: 03-30-1940, 83 y.o.   MRN: [id number]  Courtney Stone is a very pleasant 83 y.o. female with a history of hypertension, OSA, type 2 diabetes, CKD who presents today for follow-up of diabetes.  1) Type 2 Diabetes:  Current medications include: Mounjaro  5 mg weekly, glipizide  XL 10 mg daily, metformin  ER 500 mg twice daily. She denies nausea, GERD.   She would like to discontinue metformin  due to recurrent GI upset and loose stools. She often forgets the evening dose.   She is checking her blood glucose 1 time weekly and is getting readings of:  AM fasting 130-140  Last A1C: 9.0 in July 2025, 7.7 today Last Eye Exam: Up-to-date Last Foot Exam: Due Pneumonia Vaccination: 2020 Urine Microalbumin: Up-to-date Statin: None.  Dietary changes since last visit: Fresh fruits and veggies, lean protein. She has cut back on bread.    Exercise: None, active at home.     Review of Systems  Eyes:  Negative for visual disturbance.  Cardiovascular:  Negative for chest pain.  Gastrointestinal:  Positive for diarrhea.  Neurological:  Negative for dizziness and light-headedness.         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    2 children, 2 grand children.   Lives in [place].   Retired, once worked as a Runner, broadcasting/film/video.    Enjoys spending time with family, reading.    Social Drivers of Corporate investment banker Strain: Low Risk  (10/16/2023)   Overall Financial Resource Strain (CARDIA)    Difficulty of Paying Living Expenses: Not hard at all  Food Insecurity: No Food Insecurity (10/16/2023)   Hunger Vital Sign    Worried About Running Out of Food in the Last Year: Never true    Ran Out of Food in the Last Year: Never true  Transportation Needs: No Transportation Needs (10/16/2023)   PRAPARE - Administrator, Civil Service (Medical): No    Lack of Transportation (Non-Medical): No  Physical Activity: Inactive (10/23/2023)   Exercise Vital Sign    Days of Exercise per Week: 0 days    Minutes of Exercise per Session: 0 min  Stress: No Stress Concern Present (10/16/2023)   [employer] of Occupational Health - Occupational Stress Questionnaire    Feeling of Stress: Only a little  Social Connections: Moderately Isolated (10/16/2023)   Social Connection and Isolation Panel    Frequency  of Communication with Friends and Family: Once a week    Frequency of Social Gatherings with Friends and Family: Once a week    Attends Religious Services: More than 4 times per year    Active Member of [employer] or Organizations: Yes    Attends Banker Meetings: More than 4 times per year    Marital Status: Widowed  Intimate Partner Violence: Not At Risk (10/23/2023)   Humiliation, Afraid, Rape, and Kick questionnaire    Fear of Current or Ex-Partner: No    Emotionally Abused: No    Physically Abused: No    Sexually Abused: No    Past Surgical History:  Procedure Laterality Date   ABDOMINAL HYSTERECTOMY  1976   complete   BREAST BIOPSY Right    neg   BREAST SURGERY  1968    benign   CATARACT EXTRACTION, BILATERAL  2016   [facility]    EYE SURGERY  2016   FRACTURE SURGERY  2010   NECK SURGERY  1993   ?bone spur removal   URETHRA SURGERY     WRIST FRACTURE SURGERY      Family History  Problem Relation Age of Onset   Cancer Mother        lung   Cancer Father    COPD Father    Diabetes Sister    Hypertension Sister    Breast cancer Paternal Grandmother 2   Diabetes Sister    Hypertension Sister     Allergies  Allergen Reactions   Adhesive [Tape]    Sulfa Antibiotics     Headache   Latex Rash    Current Outpatient Medications on File Prior to Visit  Medication Sig Dispense Refill   Fluocinolone Acetonide 0.01 % OIL      FLUoxetine  (PROZAC ) 10 MG tablet Take 1 tablet (10 mg total) by mouth daily. for anxiety and depression. 90 tablet 2   fluticasone  (FLONASE ) 50 MCG/ACT nasal spray Place 1 spray into both nostrils 2 (two) times daily. 48 g 0   glipiZIDE  (GLUCOTROL  XL) 10 MG 24 hr tablet TAKE 1 TABLET BY MOUTH DAILY WITH BREAKFAST FOR DIABETES 90 tablet 1   glucose blood test strip Use to check blood sugar up to 3 times daily 100 each 5   lisinopril  (ZESTRIL ) 10 MG tablet Take 1 tablet (10 mg total) by mouth daily. for blood pressure. 90 tablet 1   No current facility-administered medications on file prior to visit.    BP 118/68   Pulse 67   Temp 97.7 F (36.5 C) (Temporal)   Ht 5' 7 (1.702 m)   Wt 235 lb (106.6 kg)   SpO2 96%   BMI 36.81 kg/m  Objective:   Physical Exam Cardiovascular:     Rate and Rhythm: Normal rate and regular rhythm.  Pulmonary:     Effort: Pulmonary effort is normal.     Breath sounds: Normal breath sounds.  Musculoskeletal:     Cervical back: Neck supple.  Skin:    General: Skin is warm and dry.  Neurological:     Mental Status: She is alert and oriented to person, place, and time.  Psychiatric:        Mood and Affect: Mood normal.     Physical Exam        Assessment & Plan:   Type 2 diabetes mellitus with hyperglycemia, without long-term current use of insulin (HCC) Assessment & Plan: Improving with A1c of 7.7 today!  Will discontinue metformin   due to GI side effects.  Mostly she is taking 500 mg daily anyway.  Increase Mounjaro  to 7.5 mg weekly.  She agrees. Continue glipizide  XL 10 mg daily for now.  Consider discontinuation in the future if applicable.  Foot exam today.  Follow-up in 3 months.  Orders: -     POCT glycosylated hemoglobin (Hb A1C) -     Mounjaro ; Inject 7.5 mg into the skin once a week. for diabetes.  Dispense: 6 mL; Refill: 1    Assessment and Plan Assessment & Plan         Comer MARLA Gaskins, NP    History of Present Illness

## 2023-10-31 NOTE — Assessment & Plan Note (Addendum)
 Improving with A1c of 7.7 today!  Will discontinue metformin  due to GI side effects.  Mostly she is taking 500 mg daily anyway.  Increase Mounjaro  to 7.5 mg weekly.  She agrees. Continue glipizide  XL 10 mg daily for now.  Consider discontinuation in the future if applicable.  Foot exam today.  Follow-up in 3 months.

## 2024-01-30 ENCOUNTER — Ambulatory Visit: Admitting: Primary Care

## 2024-01-30 ENCOUNTER — Encounter: Payer: Self-pay | Admitting: Primary Care

## 2024-01-30 ENCOUNTER — Ambulatory Visit: Payer: Self-pay | Admitting: Primary Care

## 2024-01-30 VITALS — BP 124/84 | HR 60 | Temp 97.8°F | Ht 67.0 in | Wt 234.5 lb

## 2024-01-30 DIAGNOSIS — J302 Other seasonal allergic rhinitis: Secondary | ICD-10-CM

## 2024-01-30 DIAGNOSIS — Z7984 Long term (current) use of oral hypoglycemic drugs: Secondary | ICD-10-CM

## 2024-01-30 DIAGNOSIS — F4323 Adjustment disorder with mixed anxiety and depressed mood: Secondary | ICD-10-CM | POA: Diagnosis not present

## 2024-01-30 DIAGNOSIS — Z7985 Long-term (current) use of injectable non-insulin antidiabetic drugs: Secondary | ICD-10-CM

## 2024-01-30 DIAGNOSIS — E1165 Type 2 diabetes mellitus with hyperglycemia: Secondary | ICD-10-CM | POA: Diagnosis not present

## 2024-01-30 DIAGNOSIS — I10 Essential (primary) hypertension: Secondary | ICD-10-CM

## 2024-01-30 LAB — POCT GLYCOSYLATED HEMOGLOBIN (HGB A1C): Hemoglobin A1C: 8.6 % — AB (ref 4.0–5.6)

## 2024-01-30 MED ORDER — FLUOXETINE HCL 10 MG PO TABS: 10.0000 mg | ORAL_TABLET | Freq: Every day | ORAL | 0 refills | Status: AC

## 2024-01-30 MED ORDER — LISINOPRIL 10 MG PO TABS: 10.0000 mg | ORAL_TABLET | Freq: Every day | ORAL | 0 refills | Status: AC

## 2024-01-30 MED ORDER — MOUNJARO 10 MG/0.5ML ~~LOC~~ SOAJ: 10.0000 mg | SUBCUTANEOUS | 1 refills | Status: AC

## 2024-01-30 MED ORDER — GLIPIZIDE ER 10 MG PO TB24: ORAL_TABLET | ORAL | 1 refills | Status: AC

## 2024-01-30 MED ORDER — FLUTICASONE PROPIONATE 50 MCG/ACT NA SUSP: 1.0000 | Freq: Two times a day (BID) | NASAL | 0 refills | Status: AC

## 2024-01-30 NOTE — Assessment & Plan Note (Signed)
 Deteriorated with A1c of 8.6 today. Largely due to recent holidays.  Increase Mounjaro  to 10 mg weekly. Continue glipizide  XL 10 mg daily.  We will see her back in the office in 3 months for follow-up

## 2024-01-30 NOTE — Progress Notes (Signed)
 "  Subjective:    Patient ID: Courtney Stone, female    DOB: 08/23/1940, 84 y.o.   MRN: [id number]  Courtney Stone is a very pleasant 84 y.o. female with a history of hypertension, OSA, type 2 diabetes, CKD, chronic fatigue who presents today for follow-up of diabetes.  She is also needing several refills of her medications.  1) Type 2 Diabetes:  Current medications include: Glipizide  XL 10 mg daily, Mounjaro  7.5 mg weekly  She is checking her blood glucose 1 times weekly and is getting readings of:  AM fasting: mid 100s  Last A1C: 7.7 in October 2025, 8.6 today Last Eye Exam: UTD Last Foot Exam: UTD Pneumonia Vaccination: 2020 Urine Microalbumin: UTD Statin: None  Dietary changes since last visit: She ate a lot of food during Thanksgiving and Christmas. She ate more than she usually does.   Exercise: None     Review of Systems  Eyes:  Negative for visual disturbance.  Respiratory:  Negative for shortness of breath.   Cardiovascular:  Negative for chest pain.  Neurological:  Negative for numbness.         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    2 children, 2 grand  children.   Lives in [place].   Retired, once worked as a runner, broadcasting/film/video.    Enjoys spending time with family, reading.    Social Drivers of Health   Tobacco Use: Low Risk (01/30/2024)   Patient History    Smoking Tobacco Use: Never    Smokeless Tobacco Use: Never    Passive Exposure: Not on file  Financial Resource Strain: Low Risk (10/16/2023)   Overall Financial Resource Strain (CARDIA)    Difficulty of Paying Living Expenses: Not hard at all  Food Insecurity: No Food Insecurity (10/16/2023)   Epic    Worried About Radiation Protection Practitioner of Food in the Last Year: Never true    Ran Out of Food in the Last Year: Never true  Transportation Needs: No Transportation Needs (10/16/2023)   Epic    Lack of Transportation (Medical): No    Lack of Transportation (Non-Medical): No  Physical Activity: Inactive (10/23/2023)   Exercise Vital Sign    Days of Exercise per Week: 0 days    Minutes of Exercise per Session: 0 min  Stress: No Stress Concern Present (10/16/2023)   [employer] of Occupational Health - Occupational Stress Questionnaire    Feeling of Stress: Only a little  Social Connections: Moderately Isolated (10/16/2023)   Social Connection and Isolation Panel  Frequency of Communication with Friends and Family: Once a week    Frequency of Social Gatherings with Friends and Family: Once a week    Attends Religious Services: More than 4 times per year    Active Member of [employer] or Organizations: Yes    Attends Banker Meetings: More than 4 times per year    Marital Status: Widowed  Intimate Partner Violence: Not At Risk (10/23/2023)   Epic    Fear of Current or Ex-Partner: No    Emotionally Abused: No    Physically Abused: No    Sexually Abused: No  Depression (PHQ2-9): Low Risk (01/30/2024)   Depression (PHQ2-9)    PHQ-2 Score: 1  Alcohol Screen: Low Risk (04/16/2021)   Alcohol Screen    Last Alcohol Screening Score (AUDIT): 0  Housing: Low Risk (10/16/2023)   Epic    Unable  to Pay for Housing in the Last Year: No    Number of Times Moved in the Last Year: 0    Homeless in the Last Year: No  Utilities: Not At Risk (10/23/2023)   Epic    Threatened with loss of utilities: No  Health Literacy: Adequate Health Literacy (10/23/2023)   B1300 Health Literacy    Frequency of need for help with medical instructions: Never    Past Surgical History:  Procedure Laterality Date   ABDOMINAL HYSTERECTOMY  1976   complete   BREAST BIOPSY Right    neg   BREAST SURGERY  1968   benign   CATARACT EXTRACTION, BILATERAL  2016   [facility]    EYE SURGERY  2016   FRACTURE SURGERY  2010   NECK SURGERY  1993   ?bone spur removal   URETHRA SURGERY     WRIST FRACTURE SURGERY      Family History  Problem Relation Age of Onset   Cancer Mother        lung   Cancer Father    COPD Father    Diabetes Sister    Hypertension Sister    Breast cancer Paternal Grandmother 31   Diabetes Sister    Hypertension Sister     Allergies[1]  Medications Ordered Prior to Encounter[2]  BP 124/84   Pulse 60   Temp 97.8 F (36.6 C) (Oral)   Ht 5' 7 (1.702 m)   Wt 234 lb 8 oz (106.4 kg)   SpO2 98%   BMI 36.73 kg/m  Objective:   Physical Exam Cardiovascular:     Rate and Rhythm: Normal rate and regular rhythm.  Pulmonary:     Effort: Pulmonary effort is normal.     Breath sounds: Normal breath sounds.  Musculoskeletal:     Cervical back: Neck supple.  Skin:    General: Skin is warm and dry.  Neurological:     Mental Status: She is alert and oriented to person, place, and time.  Psychiatric:        Mood and Affect: Mood normal.     Physical Exam        Assessment & Plan:  Type 2 diabetes mellitus with hyperglycemia, without long-term current use of insulin (HCC) Assessment & Plan: Deteriorated with A1c of 8.6 today. Largely due to recent holidays.  Increase Mounjaro  to 10 mg weekly. Continue glipizide  XL 10 mg daily.  We will see her back in  the office in 3 months for follow-up  Orders: -     POCT glycosylated hemoglobin (Hb A1C) -  glipiZIDE  ER; TAKE 1 TABLET BY MOUTH DAILY WITH BREAKFAST FOR DIABETES  Dispense: 90 tablet; Refill: 1 -     Mounjaro ; Inject 10 mg into the skin once a week. for diabetes.  Dispense: 6 mL; Refill: 1  Adjustment disorder with mixed anxiety and depressed mood -     FLUoxetine  HCl; Take 1 tablet (10 mg total) by mouth daily. for anxiety and depression.  Dispense: 90 tablet; Refill: 0  Seasonal allergies -     Fluticasone  Propionate; Place 1 spray into both nostrils 2 (two) times daily.  Dispense: 48 g; Refill: 0  Primary hypertension -     Lisinopril ; Take 1 tablet (10 mg total) by mouth daily. for blood pressure.  Dispense: 90 tablet; Refill: 0    Assessment and Plan Assessment & Plan         Comer MARLA Gaskins, NP       [1]  Allergies Allergen Reactions   Adhesive [Tape]    Sulfa Antibiotics     Headache   Latex Rash  [2]  Current Outpatient Medications on File Prior to Visit  Medication Sig Dispense Refill   Fluocinolone Acetonide 0.01 % OIL      glucose blood test strip Use to check blood sugar up to 3 times daily 100 each 5   No current facility-administered medications on file prior to visit.   "

## 2024-01-30 NOTE — Patient Instructions (Signed)
 We increased your Mounjaro  to 10 mg weekly for diabetes.  Continue glipizide .  Please schedule a physical to meet with me in 3 months.   It was a pleasure to see you today!

## 2024-04-30 ENCOUNTER — Encounter: Admitting: Primary Care

## 2024-10-26 ENCOUNTER — Ambulatory Visit
